# Patient Record
Sex: Male | Born: 1960
Health system: Southern US, Community
[De-identification: ages and names within clinical notes are randomized; demographics above are authoritative.]

## PROBLEM LIST (undated history)

## (undated) DIAGNOSIS — I10 Essential (primary) hypertension: Secondary | ICD-10-CM

## (undated) DIAGNOSIS — R0600 Dyspnea, unspecified: Secondary | ICD-10-CM

## (undated) DIAGNOSIS — Q2381 Bicuspid aortic valve: Secondary | ICD-10-CM

## (undated) DIAGNOSIS — Q231 Congenital insufficiency of aortic valve: Secondary | ICD-10-CM

## (undated) DIAGNOSIS — K219 Gastro-esophageal reflux disease without esophagitis: Secondary | ICD-10-CM

## (undated) DIAGNOSIS — K2289 Other specified disease of esophagus: Secondary | ICD-10-CM

## (undated) DIAGNOSIS — K449 Diaphragmatic hernia without obstruction or gangrene: Secondary | ICD-10-CM

## (undated) DIAGNOSIS — K900 Celiac disease: Secondary | ICD-10-CM

## (undated) DIAGNOSIS — I25119 Atherosclerotic heart disease of native coronary artery with unspecified angina pectoris: Secondary | ICD-10-CM

## (undated) DIAGNOSIS — K228 Other specified diseases of esophagus: Secondary | ICD-10-CM

## (undated) DIAGNOSIS — D509 Iron deficiency anemia, unspecified: Secondary | ICD-10-CM

## (undated) DIAGNOSIS — Z953 Presence of xenogenic heart valve: Secondary | ICD-10-CM

## (undated) DIAGNOSIS — R011 Cardiac murmur, unspecified: Secondary | ICD-10-CM

## (undated) DIAGNOSIS — K2970 Gastritis, unspecified, without bleeding: Secondary | ICD-10-CM

## (undated) DIAGNOSIS — K649 Unspecified hemorrhoids: Secondary | ICD-10-CM

## (undated) DIAGNOSIS — E039 Hypothyroidism, unspecified: Secondary | ICD-10-CM

## (undated) DIAGNOSIS — E785 Hyperlipidemia, unspecified: Secondary | ICD-10-CM

## (undated) DIAGNOSIS — M1990 Unspecified osteoarthritis, unspecified site: Secondary | ICD-10-CM

## (undated) DIAGNOSIS — I350 Nonrheumatic aortic (valve) stenosis: Secondary | ICD-10-CM

## (undated) DIAGNOSIS — I7121 Aneurysm of the ascending aorta, without rupture: Secondary | ICD-10-CM

## (undated) HISTORY — DX: Unspecified hemorrhoids: K64.9

## (undated) HISTORY — DX: Nonrheumatic aortic (valve) stenosis: I35.0

## (undated) HISTORY — DX: Celiac disease: K90.0

## (undated) HISTORY — PX: SHOULDER ARTHROSCOPY W/ ROTATOR CUFF REPAIR: SHX2400

## (undated) HISTORY — PX: HEMORRHOID BANDING: SHX5850

## (undated) HISTORY — DX: Gastro-esophageal reflux disease without esophagitis: K21.9

## (undated) HISTORY — PX: UPPER GASTROINTESTINAL ENDOSCOPY: SHX188

## (undated) HISTORY — PX: CORONARY ANGIOPLASTY: SHX604

## (undated) HISTORY — DX: Congenital insufficiency of aortic valve: Q23.1

## (undated) HISTORY — PX: BACK SURGERY: SHX140

## (undated) HISTORY — DX: Hypothyroidism, unspecified: E03.9

## (undated) HISTORY — DX: Essential (primary) hypertension: I10

## (undated) HISTORY — PX: CARDIAC VALVE REPLACEMENT: SHX585

## (undated) HISTORY — DX: Gastritis, unspecified, without bleeding: K29.70

## (undated) HISTORY — DX: Other specified diseases of esophagus: K22.8

## (undated) HISTORY — PX: LUMBAR DISC SURGERY: SHX700

## (undated) HISTORY — DX: Atherosclerotic heart disease of native coronary artery with unspecified angina pectoris: I25.119

## (undated) HISTORY — DX: Aneurysm of the ascending aorta, without rupture: I71.21

## (undated) HISTORY — DX: Hyperlipidemia, unspecified: E78.5

## (undated) HISTORY — DX: Iron deficiency anemia, unspecified: D50.9

## (undated) HISTORY — DX: Other specified disease of esophagus: K22.89

## (undated) HISTORY — DX: Diaphragmatic hernia without obstruction or gangrene: K44.9

## (undated) HISTORY — PX: BLADDER SURGERY: SHX569

## (undated) HISTORY — DX: Bicuspid aortic valve: Q23.81

---

## 2001-03-13 ENCOUNTER — Emergency Department (HOSPITAL_COMMUNITY): Admission: EM | Admit: 2001-03-13 | Discharge: 2001-03-13 | Payer: Self-pay | Admitting: Emergency Medicine

## 2006-10-06 HISTORY — PX: ESOPHAGOGASTRODUODENOSCOPY (EGD) WITH ESOPHAGEAL DILATION: SHX5812

## 2006-10-25 ENCOUNTER — Encounter: Payer: Self-pay | Admitting: Internal Medicine

## 2007-08-26 ENCOUNTER — Ambulatory Visit: Payer: Self-pay | Admitting: Internal Medicine

## 2007-08-26 ENCOUNTER — Ambulatory Visit (HOSPITAL_COMMUNITY): Admission: RE | Admit: 2007-08-26 | Discharge: 2007-08-26 | Payer: Self-pay | Admitting: Internal Medicine

## 2007-08-27 ENCOUNTER — Ambulatory Visit (HOSPITAL_COMMUNITY): Admission: RE | Admit: 2007-08-27 | Discharge: 2007-08-27 | Payer: Self-pay | Admitting: Internal Medicine

## 2007-09-01 ENCOUNTER — Ambulatory Visit: Payer: Self-pay | Admitting: Internal Medicine

## 2007-09-28 ENCOUNTER — Ambulatory Visit: Payer: Self-pay | Admitting: Internal Medicine

## 2007-10-07 HISTORY — PX: COLONOSCOPY: SHX174

## 2007-10-30 DIAGNOSIS — E785 Hyperlipidemia, unspecified: Secondary | ICD-10-CM | POA: Insufficient documentation

## 2007-10-30 DIAGNOSIS — E039 Hypothyroidism, unspecified: Secondary | ICD-10-CM | POA: Insufficient documentation

## 2007-10-30 DIAGNOSIS — K219 Gastro-esophageal reflux disease without esophagitis: Secondary | ICD-10-CM | POA: Insufficient documentation

## 2007-10-30 DIAGNOSIS — R1319 Other dysphagia: Secondary | ICD-10-CM | POA: Insufficient documentation

## 2008-05-19 ENCOUNTER — Ambulatory Visit: Payer: Self-pay | Admitting: Internal Medicine

## 2008-06-02 ENCOUNTER — Ambulatory Visit: Payer: Self-pay | Admitting: Internal Medicine

## 2009-12-20 DIAGNOSIS — N401 Enlarged prostate with lower urinary tract symptoms: Secondary | ICD-10-CM | POA: Insufficient documentation

## 2009-12-20 DIAGNOSIS — K222 Esophageal obstruction: Secondary | ICD-10-CM | POA: Insufficient documentation

## 2009-12-20 DIAGNOSIS — I10 Essential (primary) hypertension: Secondary | ICD-10-CM | POA: Insufficient documentation

## 2010-03-18 ENCOUNTER — Emergency Department (HOSPITAL_COMMUNITY): Admission: EM | Admit: 2010-03-18 | Discharge: 2010-03-19 | Payer: Self-pay | Admitting: Emergency Medicine

## 2010-10-06 HISTORY — PX: SHOULDER ARTHROSCOPY W/ ROTATOR CUFF REPAIR: SHX2400

## 2011-02-18 NOTE — Assessment & Plan Note (Signed)
James Wilkins, James Wilkins                         MRN:          [id number]  DATE:08/26/2007                            DOB:          [birth year]    REFERRING PHYSICIAN:  Janalyn Rouse, M.D.   CHIEF COMPLAINT:  Dysphagia.   ASSESSMENT:  A 50 year old white man with chronic indigestion problems.  This is mainly described as a pressure problem.  He is having worsening  reflux, and he is having worsening solid food dysphagia.   PLAN:  Schedule EGD and esophageal dilation, possibly today if we can  add him on to my afternoon schedule.  Risks, benefits and indications  were described.  He understands and agrees to proceed.   HISTORY:  This is a [age] year old white man who has had chronic  indigestion described as pressure and belching over the years.  He had a  cold a month ago and started noticing worsening reflux problems after  that, waking up at night with hot fluid coming up at times.  He has had  intermittent solid food dysphagia, and over the past week he has had 2  episodes where he has had severe impact dysphagia to solid food and felt  like he needed to call 911 almost.  Eventually, that passed.  He has  some epigastric pain.  He had a lot of gas, bloating and belching.  He  does not use caffeine or smoke.  Dr. Brigitte Pulse has put him on Zegerid twice a  day, which I not really relieving this problem at this time.  Interestingly, a brother a has had similar problems, and has had  esophageal dilation.   MEDICATIONS:  1. Synthroid 0.88 mcg daily.  2. Zocor 20 mg daily.  3. Zegerid 40 mg twice daily.  4. Mega-Men multivitamin daily.  5. Tylenol p.r.n.   ALLERGIES:  No known drug allergies.   PAST MEDICAL HISTORY:  1. What sounds like gastroesophageal reflux disease.  2. Dyslipidemia.  3. Hypothyroidism.  4. Possible sleep apnea.   FAMILY HISTORY:  As described above.  He does have a  sister with  ulcerative colitis and a sister with colon polyps apparently.   SOCIAL HISTORY:  He is married.  He is a Aeronautical engineer.  Here with  his wife.  No alcohol, tobacco or drugs.   REVIEW OF SYSTEMS:  He has had some cough and fatigue issues.  All other  systems are negative.   PHYSICAL EXAMINATION:  GENERAL:  A well-developed, well-nourished, white  man.  VITAL SIGNS:  Height 5 feet, 6 inches, weight 173 pounds, blood pressure  128/86, pulse 64.  HEENT:  Eyes are anicteric.  Mouth - posterior pharynx free of lesions.  NECK:  Supple without thyromegaly or masses.  CHEST:  Clear.  HEART:  S1 and S2.  No murmurs, rubs, or gallops.  ABDOMEN:  Soft, nontender.  No organomegaly or mass.  EXTREMITIES:  Lower extremities free of edema.  Observed areas of the  skin on the trunk are normal.  NEUROLOGIC:  He is alert and  oriented x3.   I have reviewed the medication list, labs (normal CBC and CMET, November  2008).     Gatha Mayer, MD,FACG  Electronically Signed    CEG/MedQ  DD: 08/26/2007  DT: 08/26/2007  Job #: [id number]   cc:   Janalyn Rouse, M.D.

## 2011-02-18 NOTE — Assessment & Plan Note (Signed)
James Wilkins OFFICE NOTE   James Wilkins, James Wilkins                         MRN:          [id number]  DATE:09/28/2007                            DOB:          02-Nov-1960    CHIEF COMPLAINT:  Follow up after dilation, reflux.   EGD on August 26, 2007 showed mildly tortuous esophagus, dilated to 64  [place] with a Maloney dilation.  He says that helped his dysphagia quite  a bit.  He has done a barium swallow as well that showed normal  motility, a small hiatal hernia, and some siphon reflux.  He remains on  Zegerid 40 mg b.i.d., but he is still having belching but also what  sounds like reflux of fluid at times.  He belches a couple of times in  the exam room while we are here together.   His other medications include Synthroid, Zocor, and Mega Men  multivitamin.   PHYSICAL EXAMINATION:  VITAL SIGNS:  Weight 175 pounds.  Pulse 66, blood  pressure 114/68.   Note that when he gets an episode of reflux, he says that he wants to  cut off his air quite a bit.  He feels quite miserable.  There is,  perhaps, some nocturnal reflux at times as well.   ASSESSMENT:  1. Symptoms of gastroesophageal reflux disease.  2. Symptoms of belching, I am not sure if they are related.  3. Dysphagia, appears to be resolved.  No stricture.   PLAN:  The Zegerid is not working.  We are going to try Aciphex 20 mg  b.i.d.  I have given him samples of that.  He will call in two weeks.  If that is not helpful, he could need a Bravo pH probe.  The question  would be whether to do it off or on medication.  He has had some  atypical symptoms of pressure.  A manometry could be indicated as well,  given the dysphagia history and the pressure symptoms.  He could even  need an impedance study, which would give [place] both acid and non-acid  reflux data, although he would have to go to [facility] for that.  We will  see how he does with the two week trial  of Aciphex.  If that works, it  will be easy.  We will just continue that.     Gatha Mayer, MD,FACG  Electronically Signed    CEG/MedQ  DD: 09/28/2007  DT: 09/29/2007  Job #: [id number]   cc:   Janalyn Rouse, M.D.

## 2011-06-30 ENCOUNTER — Other Ambulatory Visit: Payer: Self-pay | Admitting: Otolaryngology

## 2011-06-30 DIAGNOSIS — H905 Unspecified sensorineural hearing loss: Secondary | ICD-10-CM

## 2011-07-02 ENCOUNTER — Other Ambulatory Visit: Payer: Self-pay

## 2012-05-14 DIAGNOSIS — H8110 Benign paroxysmal vertigo, unspecified ear: Secondary | ICD-10-CM | POA: Insufficient documentation

## 2014-03-31 DIAGNOSIS — K644 Residual hemorrhoidal skin tags: Secondary | ICD-10-CM | POA: Insufficient documentation

## 2014-04-04 DIAGNOSIS — K900 Celiac disease: Secondary | ICD-10-CM | POA: Insufficient documentation

## 2014-04-05 ENCOUNTER — Telehealth: Payer: Self-pay | Admitting: Internal Medicine

## 2014-04-05 NOTE — Telephone Encounter (Signed)
Unable to reach pt or leave a message  

## 2014-04-06 NOTE — Telephone Encounter (Signed)
Left message on home phone for pt to call back.

## 2014-04-10 LAB — IFOBT (OCCULT BLOOD): IFOBT: NEGATIVE

## 2014-04-11 NOTE — Telephone Encounter (Signed)
After explaining office policy for switching physicians, pt has decided to stay with Dr. Carlean Purl.

## 2014-06-20 ENCOUNTER — Encounter: Payer: Self-pay | Admitting: Internal Medicine

## 2014-08-16 ENCOUNTER — Encounter: Payer: Self-pay | Admitting: Gastroenterology

## 2014-08-18 ENCOUNTER — Encounter: Payer: Self-pay | Admitting: Internal Medicine

## 2014-08-18 ENCOUNTER — Ambulatory Visit (INDEPENDENT_AMBULATORY_CARE_PROVIDER_SITE_OTHER): Payer: 59 | Admitting: Internal Medicine

## 2014-08-18 VITALS — BP 144/72 | HR 56 | Ht 65.25 in | Wt 167.1 lb

## 2014-08-18 DIAGNOSIS — K649 Unspecified hemorrhoids: Secondary | ICD-10-CM

## 2014-08-18 DIAGNOSIS — K644 Residual hemorrhoidal skin tags: Secondary | ICD-10-CM | POA: Insufficient documentation

## 2014-08-18 DIAGNOSIS — K900 Celiac disease: Secondary | ICD-10-CM

## 2014-08-18 NOTE — Assessment & Plan Note (Signed)
Reassess and consider Tx options at next visit. May improve with improvement of diarrhea. Colonoscopy 2009 and Hemosure negative this year

## 2014-08-18 NOTE — Assessment & Plan Note (Signed)
We discussed the pros and cons of endoscopy and duodenal biopsy about some experts recommended some experts that is not necessary. He has decided not to pursue an upper endoscopy with biopsy of the duodenum. He will continue on a gluten-free diet and work on improving that and we will send into a dietitian. I will see him back in January to regroup and see how he is doing. I had a brief discussion about the other risks and problems of celiac disease and an informational handout was given. 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 Consider further testing such as vitamin Dand vitamin B12 etc. When he returns.however his basic laboratories looked fine.

## 2014-08-18 NOTE — Patient Instructions (Addendum)
You have been referred to a dietician and will be contacted about an appointment.  We want to see you again in January, appointment made for 10/20/14 at 8:30am.  Today you have been given a handout to read on Celiac sprue.   I appreciate the opportunity to care for you.

## 2014-08-18 NOTE — Progress Notes (Signed)
Referred by Janalyn Rouse, MD Subjective:    Patient ID: James Wilkins, male    DOB: 29-Aug-1961, 53 y.o.   MRN: [id number]  HPI The patient is a pleasant 53 year old white man I know from prior colonoscopy and upper endoscopy procedures. For the past 6 months or so he is been having diarrhea problems with bloating and flatulence. Sometimes he was apprised and passes mucous or small amounts of stool and has incontinence. Recent blood testing has raised the possibility of celiac disease. He has a positive anti-and a mesial antibody and a positive tissue transglutaminase antibody, both are IgA based tests and his serum IgA is normal.He might be a little bit better while waiting for this appointment as he has started to restrict gluten somewhat but overall still has urgent loose defecation and gassy symptoms.  He has had chronic intermittent rectal bleeding he attributed to hemorrhoids, slight red blood on the toilet paper, none mixed in with the stool and no melena. No Known Allergies   Current outpatient prescriptions: levothyroxine (SYNTHROID, LEVOTHROID) 125 MCG tablet, Take 125 mcg by mouth daily before breakfast., Disp: , Rfl: ;  simvastatin (ZOCOR) 20 MG tablet, Take 20 mg by mouth at bedtime., Disp: , Rfl:    Past Medical History  Diagnosis Date  . Hiatal hernia   . GERD (gastroesophageal reflux disease)   . Hemorrhoids   . Dilation of esophagus     tortuous esophagus  . Gastritis     non erosive  . HLD (hyperlipidemia)   . Hypothyroidism   . Hypertension    Past Surgical History  Procedure Laterality Date  . Colonoscopy  2009  . Esophagogastroduodenoscopy  2008  . Back surgery  1999  . Shoulder surgery Right 2012  . Shoulder surgery Left 2014    x2   History   Social History  . Marital Status: Married    Spouse Name: N/A    Number of Children: 0  . Years of Education: N/A   Occupational History  . Furniture Loss adjuster, chartered    Social History Main Topics  . Smoking  status: Never Smoker   . Smokeless tobacco: Never Used  . Alcohol Use: No  . Drug Use: No   Social History Narrative   Married no children works as a Aeronautical engineer in [place] History  Problem Relation Age of Onset  . Diabetes Father     Type II  . Brain cancer Father   . Heart disease Mother   . Heart disease Father   . Colon cancer Neg Hx   . Colon polyps Sister   . Kidney disease Neg Hx   . Esophageal cancer Neg Hx         Review of Systems All other review of systems negative or as per history of present illness    Objective:   Physical Exam General:  Well-developed, well-nourished and in no acute distress Eyes:  anicteric. Neck:   supple w/o thyromegaly or mass.  Lungs: Clear to auscultation bilaterally. Heart:  S1S2, no rubs, murmurs, gallops. Abdomen:  soft, non-tender, no hepatosplenomegaly, hernia, or mass and BS+.  Rectal: Small anterior (right) pile, some mild anal stenosis, no mass, no stool, no blood Lymph:  no cervical or supraclavicular adenopathy. Extremities:   no edema Skin   no rash. Neuro:  A&O x 3.  Psych:  appropriate mood and  Affect.   Data Reviewed: Labs from Dr. Raul Del office dated 04/10/2014. Normal TSH,  normal free T4,negative FOBT immune based, low serum testosterone at 272, normal comprehensive metabolic panel and CBC     Assessment & Plan:  Celiac disease We discussed the pros and cons of endoscopy and duodenal biopsy about some experts recommended some experts that is not necessary. He has decided not to pursue an upper endoscopy with biopsy of the duodenum. He will continue on a gluten-free diet and work on improving that and we will send into a dietitian. I will see him back in January to regroup and see how he is doing. I had a brief discussion about the other risks and problems of celiac disease and an informational handout was given. 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 Consider further testing  such as vitamin Dand vitamin B12 etc. When he returns.however his basic laboratories looked fine.  Bleeding hemorrhoids Reassess and consider Tx options at next visit. May improve with improvement of diarrhea. Colonoscopy 2009 and Hemosure negative this year    I appreciate the opportunity to care for this patient. QQ:PYPP, Gwyndolyn Saxon, MD

## 2014-09-21 ENCOUNTER — Encounter: Payer: Self-pay | Admitting: Skilled Nursing Facility1

## 2014-09-21 ENCOUNTER — Encounter: Payer: 59 | Attending: Internal Medicine | Admitting: Skilled Nursing Facility1

## 2014-09-21 VITALS — Ht 66.0 in | Wt 168.0 lb

## 2014-09-21 DIAGNOSIS — Z713 Dietary counseling and surveillance: Secondary | ICD-10-CM | POA: Diagnosis not present

## 2014-09-21 DIAGNOSIS — K900 Celiac disease: Secondary | ICD-10-CM | POA: Insufficient documentation

## 2014-09-21 NOTE — Patient Instructions (Addendum)
-  Try sherbet instead of ice cream -When buying supplements look for the USP seal -Once a month try a new vegetable -Try a chicken sandwich - Stir more towards plant fast rather than animal such as canola oil or olive oil -[employer]-Google for recipes -Allergan label looks like *This product may contain _____"

## 2014-09-21 NOTE — Progress Notes (Signed)
Medical Nutrition Therapy:  Appt start time: 0800 end time:  0900.   Assessment:  Primary concerns today: Patient referred for celiac disease. The patient would like help with how to be gluten free. Since he was officially diagnosed with celiac's he has only eaten gluten free products. The patient had a lot of diarrhea prior to changing his diet to gluten free. The patient complains of some episodes of GI distress but it is less frequent than before his diet change. The patient also complains of GI distress when he has a bowl of ice cream.  But does not have any issues with cheese and does not consume any other dairy product. The patient's wife came with him and states he will say he does not like a food even though he has never tried it and does not like to consume foods he is unfamiliar with especially vegetables. The patient is on a cholesterol lowering medication as well as a thyroid medication for some hypothyroidism. The patient states he was about 185 pounds but that weight was lost without a change of diet or physical activity so the weight loss is most likely due to the malabsorptive celiac disease. His wife seems very supportive and maybe even frustrated wife her husbands lack of willingness to eat a variety of foods.   Preferred Learning Style:   Auditory  Visual  Learning Readiness:   Contemplating  MEDICATIONS: See List   DIETARY INTAKE:  Usual eating pattern includes 3 meals and 3 snacks per day.  Everyday foods include [place] and cheese sandwhich.  Avoided foods include most fruits and all vegetables as well as mustard.   24-hr recall:  B ( AM): [place] and cheese sandwich on gluten free bread Snk ( AM): payday or butter finger L ( PM): [place] and cheese sandwich and mashed potatoes (packaged-gluten free) Snk ( PM): payday or butter finger------banana D ( PM): chicken tenders, mashed potatoes Snk ( PM): bowel of ice cream Beverages: dr. Malachi Bonds, water  Usual physical  activity: Hard Labor; home gym with free weight-[employer] extreme-1 a week  Estimated energy needs: 2000 calories 225 g carbohydrates 150 g protein 50 g fat  Progress Towards Goal(s):  In progress.   Nutritional Diagnosis:  NB-1.1 Food and nutrition-related knowledge deficit As related to a new diagnosis of a nutritionally treated disease state (celiac disease).  As evidenced by consuming packaged products without reading the nutrition label .    Intervention:  Nutrition counseling for celiac disease. Dietitian educated the patient on celiac disease, how it is nutritionally treated, and the ramifications of the consumption of wheat. The diatitian also taught the patient on the importance of a varied diet including vegetables and the importance of reading the label of processed foods especially the luncheon meats he eats every day.  Goals:  -Try sherbet instead of ice cream -When buying supplements look for the USP seal -Once a month try a new vegetable -Try a chicken sandwich - Stear more towards plant fats rather than animal such as canola oil or olive oil -[employer]-Google for recipes -Allergan label looks like "*This product may contain _____"  Teaching Method Utilized:  Visual Auditory   Handouts given during visit include:  Celiac Handouts from [facility]  Barriers to learning/adherence to lifestyle change: Patient has been eating the same limited diet his whole life and is resistance to adding more fruits and vegetables but he has not had an issue with cutting gluten from his diet.  Demonstrated degree of  understanding via:  Teach Back   Monitoring/Evaluation:  Dietary intake prn.

## 2014-09-27 ENCOUNTER — Ambulatory Visit: Payer: 59 | Admitting: Dietician

## 2014-10-20 ENCOUNTER — Other Ambulatory Visit (INDEPENDENT_AMBULATORY_CARE_PROVIDER_SITE_OTHER): Payer: 59

## 2014-10-20 ENCOUNTER — Ambulatory Visit (INDEPENDENT_AMBULATORY_CARE_PROVIDER_SITE_OTHER): Payer: 59 | Admitting: Internal Medicine

## 2014-10-20 ENCOUNTER — Encounter: Payer: Self-pay | Admitting: Internal Medicine

## 2014-10-20 VITALS — BP 136/76 | HR 68 | Ht 65.25 in | Wt 170.1 lb

## 2014-10-20 DIAGNOSIS — K900 Celiac disease: Secondary | ICD-10-CM

## 2014-10-20 DIAGNOSIS — K649 Unspecified hemorrhoids: Secondary | ICD-10-CM

## 2014-10-20 LAB — VITAMIN D 25 HYDROXY (VIT D DEFICIENCY, FRACTURES): VITD: 30.41 ng/mL (ref 30.00–100.00)

## 2014-10-20 LAB — IGA: IgA: 254 mg/dL (ref 68–378)

## 2014-10-20 LAB — VITAMIN B12: VITAMIN B 12: 1162 pg/mL — AB (ref 211–911)

## 2014-10-20 NOTE — Assessment & Plan Note (Addendum)
Improving as diarrhea proves. We revisited the possibility of hemorrhoid banding as an option but it sounds like these may improve further, he is not inclined to pursue that at this time but he knows that the possibility if desired.

## 2014-10-20 NOTE — Assessment & Plan Note (Addendum)
Better on gluten-free diet. Check TTG IgA a vitamin D level and B12 level. Anticipate he will be able to follow-up with his excellent primary care provider and have biannual or at least annual serologies checked assuming things are going well.

## 2014-10-20 NOTE — Progress Notes (Addendum)
   Subjective:    Patient ID: James Wilkins, male    DOB: 1961-04-12, 54 y.o.   MRN: [id number]  HPI James Wilkins is here for follow-up of celiac disease and bleeding hemorrhoids. Diarrhea is much better on a gluten-free diet. He met with a dietitian who reinforced what diet changes he had been doing. Denies any achiness or fatigue other than some joint pains disease had off and on and certain joints with prior orthopedic treatment. Hemorrhoids are bleeding less, as diarrhea is calming down. Medications, allergies, past medical history, past surgical history, family history and social history are reviewed and updated in the EMR.   Review of Systems As above    Objective:   Physical Exam Well-developed well-nourished no acute distress BP 136/76 mmHg  Pulse 68  Ht 5' 5.25" (1.657 m)  Wt 170 lb 2 oz (77.168 kg)  BMI 28.11 kg/m2     Assessment & Plan:  Bleeding hemorrhoids Improving as diarrhea proves. We revisited the possibility of hemorrhoid banding as an option but it sounds like these may improve further, he is not inclined to pursue that at this time but he knows that the possibility if desired.   Celiac disease Better on gluten-free diet. Check TTG IgA a vitamin D level and B12 level. Anticipate he will be able to follow-up with his excellent primary care provider and have biannual or at least annual serologies checked assuming things are going well.    I appreciate the opportunity to care for this patient. CC: Marton Redwood, MD

## 2014-10-20 NOTE — Patient Instructions (Signed)
Your physician has requested that you go to the basement for the following lab work before leaving today: B-12, Vitamin D level, TTG/IGA   I appreciate the opportunity to care for you. Silvano Rusk, M.D., [facility]

## 2014-10-23 LAB — TISSUE TRANSGLUTAMINASE, IGA: TISSUE TRANSGLUTAMINASE AB, IGA: 5 U/mL — AB (ref <4)

## 2014-10-24 NOTE — Progress Notes (Signed)
Quick Note:  Let him know he is doing a good job with his gluten free diet Ab levels almost normal B12 and vit D ok  Keep up the good work - plan is for him to f/u Dr. Brigitte Pulse and see me prn  I will send labs to Dr. Brigitte Pulse also  ______

## 2014-10-25 ENCOUNTER — Telehealth: Payer: Self-pay | Admitting: Internal Medicine

## 2014-10-25 NOTE — Telephone Encounter (Signed)
Wife notified.  See labs for additional documentation

## 2015-03-30 DIAGNOSIS — E291 Testicular hypofunction: Secondary | ICD-10-CM | POA: Insufficient documentation

## 2015-04-17 ENCOUNTER — Ambulatory Visit: Payer: 59 | Admitting: Internal Medicine

## 2016-03-15 ENCOUNTER — Encounter (HOSPITAL_COMMUNITY): Payer: Self-pay | Admitting: Emergency Medicine

## 2016-03-15 ENCOUNTER — Emergency Department (HOSPITAL_COMMUNITY)
Admission: EM | Admit: 2016-03-15 | Discharge: 2016-03-15 | Disposition: A | Discharge status: Home / Self Care | Payer: 59 | Attending: Emergency Medicine | Admitting: Emergency Medicine

## 2016-03-15 DIAGNOSIS — E785 Hyperlipidemia, unspecified: Secondary | ICD-10-CM | POA: Insufficient documentation

## 2016-03-15 DIAGNOSIS — I10 Essential (primary) hypertension: Secondary | ICD-10-CM | POA: Diagnosis not present

## 2016-03-15 DIAGNOSIS — M5441 Lumbago with sciatica, right side: Secondary | ICD-10-CM | POA: Insufficient documentation

## 2016-03-15 DIAGNOSIS — Y929 Unspecified place or not applicable: Secondary | ICD-10-CM | POA: Diagnosis not present

## 2016-03-15 DIAGNOSIS — Y999 Unspecified external cause status: Secondary | ICD-10-CM | POA: Insufficient documentation

## 2016-03-15 DIAGNOSIS — W010XXA Fall on same level from slipping, tripping and stumbling without subsequent striking against object, initial encounter: Secondary | ICD-10-CM | POA: Diagnosis not present

## 2016-03-15 DIAGNOSIS — Z79899 Other long term (current) drug therapy: Secondary | ICD-10-CM | POA: Diagnosis not present

## 2016-03-15 DIAGNOSIS — Y939 Activity, unspecified: Secondary | ICD-10-CM | POA: Diagnosis not present

## 2016-03-15 DIAGNOSIS — M545 Low back pain: Secondary | ICD-10-CM | POA: Diagnosis present

## 2016-03-15 DIAGNOSIS — Z8719 Personal history of other diseases of the digestive system: Secondary | ICD-10-CM | POA: Insufficient documentation

## 2016-03-15 DIAGNOSIS — E039 Hypothyroidism, unspecified: Secondary | ICD-10-CM | POA: Diagnosis not present

## 2016-03-15 MED ORDER — HYDROMORPHONE HCL 1 MG/ML IJ SOLN
1.0000 mg | Freq: Once | INTRAMUSCULAR | Status: AC
  Administered 2016-03-15: 1 mg via INTRAVENOUS
  Filled 2016-03-15: qty 1

## 2016-03-15 MED ORDER — DIAZEPAM 5 MG PO TABS
5.0000 mg | ORAL_TABLET | Freq: Once | ORAL | Status: AC
  Administered 2016-03-15: 5 mg via ORAL
  Filled 2016-03-15: qty 1

## 2016-03-15 MED ORDER — ONDANSETRON HCL 4 MG/2ML IJ SOLN
4.0000 mg | Freq: Once | INTRAMUSCULAR | Status: AC
  Administered 2016-03-15: 4 mg via INTRAVENOUS
  Filled 2016-03-15: qty 2

## 2016-03-15 MED ORDER — OXYCODONE-ACETAMINOPHEN 5-325 MG PO TABS: 1.0000 | ORAL_TABLET | ORAL | Status: DC | PRN

## 2016-03-15 MED ORDER — KETOROLAC TROMETHAMINE 30 MG/ML IJ SOLN
30.0000 mg | Freq: Once | INTRAMUSCULAR | Status: AC
  Administered 2016-03-15: 30 mg via INTRAVENOUS
  Filled 2016-03-15: qty 1

## 2016-03-15 MED ORDER — DEXAMETHASONE SODIUM PHOSPHATE 10 MG/ML IJ SOLN
10.0000 mg | Freq: Once | INTRAMUSCULAR | Status: AC
  Administered 2016-03-15: 10 mg via INTRAVENOUS
  Filled 2016-03-15: qty 1

## 2016-03-15 MED ORDER — IBUPROFEN 800 MG PO TABS: 800.0000 mg | ORAL_TABLET | Freq: Three times a day (TID) | ORAL | Status: DC

## 2016-03-15 MED ORDER — MORPHINE SULFATE (PF) 4 MG/ML IV SOLN
4.0000 mg | Freq: Once | INTRAVENOUS | Status: AC
  Administered 2016-03-15: 4 mg via INTRAVENOUS
  Filled 2016-03-15: qty 1

## 2016-03-15 MED ORDER — DIAZEPAM 5 MG PO TABS: 5.0000 mg | ORAL_TABLET | Freq: Two times a day (BID) | ORAL | Status: DC | PRN

## 2016-03-15 NOTE — Discharge Instructions (Signed)
Please call your neurosurgeon or Dr. Christella Noa for neurosurgery evaluation. In the meantime I will give you a few prescriptions to help with your pain. Return to the emergency room for new or worsening symptoms.

## 2016-03-15 NOTE — ED Provider Notes (Signed)
CSN: [id number]     Arrival date & time 03/15/16  0607 History   First MD Initiated Contact with Patient 03/15/16 [phone]     Chief Complaint  Patient presents with  . Back Pain     HPI   James Wilkins is an 55 y.o. male with history of chronic back pain, HTN, HLD, GERD who presents to the ED for evaluation of back pain. He states that yesterday his lawnmower fell into a ditch and he tried to pull it out and he thinks he pulled something "deep in his back." He states at first the pain was not that bad but over the course of the evening and overnight the pain has progressed and is now unbearable and he cannot find a comfortable position. He states the pain is all over his low back and radiates down his right hip and thigh. He states it feels like it is "deep inside" and "does not feel like a muscle." He states he has tried aleve and tylenol with no relief. Denies new numbness, weakness, or tingling. He states walking is difficult due to his degree of pain. He does have a history of prior back surgeries, he has not seen neurosurgery in many years.   Past Medical History  Diagnosis Date  . Hiatal hernia   . GERD (gastroesophageal reflux disease)   . Hemorrhoids   . Dilation of esophagus     tortuous esophagus  . Gastritis     non erosive  . HLD (hyperlipidemia)   . Hypothyroidism   . Hypertension   . Celiac disease    Past Surgical History  Procedure Laterality Date  . Colonoscopy  2009  . Esophagogastroduodenoscopy  2008  . Back surgery  1999  . Shoulder surgery Right 2012  . Shoulder surgery Left 2014    x2  . Bladder surgery     Family History  Problem Relation Age of Onset  . Diabetes Father     Type II  . Brain cancer Father   . Heart disease Father   . Heart disease Mother   . Colon cancer Neg Hx   . Kidney disease Neg Hx   . Esophageal cancer Neg Hx   . Colon polyps Sister   . Cancer Other   . Hypertension Other    Social History  Substance Use Topics  . Smoking  status: Never Smoker   . Smokeless tobacco: Never Used  . Alcohol Use: No    Review of Systems  All other systems reviewed and are negative.     Allergies  Review of patient's allergies indicates no known allergies.  Home Medications   Prior to Admission medications   Medication Sig Start Date End Date Taking? Authorizing Provider  acetaminophen (TYLENOL) 500 MG tablet Take 1,000 mg by mouth every 6 (six) hours as needed for mild pain.   Yes Historical Provider, MD  levothyroxine (SYNTHROID, LEVOTHROID) 100 MCG tablet Take 100 mcg by mouth daily before breakfast.   Yes Historical Provider, MD  lisinopril (PRINIVIL,ZESTRIL) 20 MG tablet Take 20 mg by mouth daily.   Yes Historical Provider, MD  Multiple Vitamins-Minerals (MULTIVITAMIN WITH MINERALS) tablet Take 1 tablet by mouth daily.   Yes Historical Provider, MD  naproxen sodium (ANAPROX) 220 MG tablet Take 440 mg by mouth 2 (two) times daily as needed (pain).   Yes Historical Provider, MD  simvastatin (ZOCOR) 20 MG tablet Take 20 mg by mouth at bedtime.   Yes Historical Provider, MD  BP 182/103 mmHg  Pulse 74  Temp(Src) 98 F (36.7 C) (Oral)  Resp 20  SpO2 100% Physical Exam  Constitutional: He is oriented to person, place, and time.  Appears uncomfortable, NAD  HENT:  Right Ear: External ear normal.  Left Ear: External ear normal.  Nose: Nose normal.  Mouth/Throat: Oropharynx is clear and moist. No oropharyngeal exudate.  Eyes: Conjunctivae and EOM are normal. Pupils are equal, round, and reactive to light.  Neck: Normal range of motion. Neck supple.  Cardiovascular: Normal rate, regular rhythm, normal heart sounds and intact distal pulses.   Pulmonary/Chest: Effort normal and breath sounds normal. No respiratory distress. He has no wheezes. He exhibits no tenderness.  Abdominal: Soft. Bowel sounds are normal. He exhibits no distension. There is no tenderness. There is no rebound and no guarding.  Musculoskeletal: He  exhibits no edema.  No midline back tenderness. No stepoff or deformity. Diffuse bilateral lumbar paraspinal ttp. Positive R SLR.  Neurological: He is alert and oriented to person, place, and time. No cranial nerve deficit.  5/5 strength bilateral LE and UE. Intact sensation. 2+ dp. Normal finger to nose. No pronator drift.  Skin: Skin is warm and dry.  Psychiatric: He has a normal mood and affect.  Nursing note and vitals reviewed.   ED Course  Procedures (including critical care time) Labs Review Labs Reviewed - No data to display  Imaging Review No results found. I have personally reviewed and evaluated these images and lab results as part of my medical decision-making.   EKG Interpretation None      MDM   Final diagnoses:  Bilateral low back pain with right-sided sciatica    No midline back tenderness. No focal neuro findings. Will hold off on back imaging today. I do suspect possible disc herniation. Instructed close f/u with neurosurgery. Rx for supportive meds given. Pain improved in the ED. ER return precautions given.    Anne Ng, PA-C 03/15/16 [place], MD 03/15/16 [phone]

## 2016-03-15 NOTE — ED Notes (Addendum)
Pt presents to ER from home for severe lower back pain after falling off lawn mower yesterday afternoon; pt reports taking aleve prior to bed and was able to sleep but woke up this morning to toilet and pain worsened to where patient was diaphoretic; pt reports "cannot find a comfortable position"; pt reports pain runs down right hip to right knee

## 2016-03-18 ENCOUNTER — Other Ambulatory Visit: Payer: Self-pay | Admitting: Internal Medicine

## 2016-03-18 ENCOUNTER — Other Ambulatory Visit: Payer: 59

## 2016-03-18 DIAGNOSIS — M5416 Radiculopathy, lumbar region: Secondary | ICD-10-CM

## 2016-03-19 ENCOUNTER — Other Ambulatory Visit: Payer: 59

## 2016-05-28 ENCOUNTER — Other Ambulatory Visit: Payer: Self-pay | Admitting: Internal Medicine

## 2016-05-28 ENCOUNTER — Other Ambulatory Visit: Payer: Self-pay | Admitting: *Deleted

## 2016-05-28 DIAGNOSIS — M542 Cervicalgia: Secondary | ICD-10-CM

## 2016-06-02 ENCOUNTER — Ambulatory Visit
Admission: RE | Admit: 2016-06-02 | Discharge: 2016-06-02 | Disposition: A | Discharge status: Home / Self Care | Payer: 59 | Source: Ambulatory Visit | Attending: Internal Medicine | Admitting: Internal Medicine

## 2016-06-02 DIAGNOSIS — M542 Cervicalgia: Secondary | ICD-10-CM

## 2016-12-30 ENCOUNTER — Ambulatory Visit: Payer: 59 | Admitting: Physician Assistant

## 2017-01-21 DIAGNOSIS — F320 Major depressive disorder, single episode, mild: Secondary | ICD-10-CM | POA: Insufficient documentation

## 2017-06-09 ENCOUNTER — Other Ambulatory Visit: Payer: Self-pay | Admitting: Internal Medicine

## 2017-06-09 DIAGNOSIS — R011 Cardiac murmur, unspecified: Secondary | ICD-10-CM

## 2017-06-11 ENCOUNTER — Other Ambulatory Visit (HOSPITAL_COMMUNITY): Payer: 59

## 2017-07-10 ENCOUNTER — Other Ambulatory Visit: Payer: Self-pay

## 2017-07-10 ENCOUNTER — Ambulatory Visit (HOSPITAL_COMMUNITY): Payer: 59 | Attending: Cardiovascular Disease

## 2017-07-10 DIAGNOSIS — R011 Cardiac murmur, unspecified: Secondary | ICD-10-CM

## 2017-07-10 DIAGNOSIS — Q231 Congenital insufficiency of aortic valve: Secondary | ICD-10-CM | POA: Insufficient documentation

## 2017-07-10 DIAGNOSIS — E785 Hyperlipidemia, unspecified: Secondary | ICD-10-CM | POA: Insufficient documentation

## 2017-07-10 DIAGNOSIS — I071 Rheumatic tricuspid insufficiency: Secondary | ICD-10-CM | POA: Insufficient documentation

## 2017-07-21 DIAGNOSIS — I359 Nonrheumatic aortic valve disorder, unspecified: Secondary | ICD-10-CM | POA: Insufficient documentation

## 2017-07-21 DIAGNOSIS — Z952 Presence of prosthetic heart valve: Secondary | ICD-10-CM | POA: Insufficient documentation

## 2017-07-30 ENCOUNTER — Encounter: Payer: Self-pay | Admitting: Cardiovascular Disease

## 2017-07-30 ENCOUNTER — Ambulatory Visit (INDEPENDENT_AMBULATORY_CARE_PROVIDER_SITE_OTHER): Payer: 59 | Admitting: Cardiovascular Disease

## 2017-07-30 VITALS — BP 140/80 | HR 51 | Ht 65.0 in | Wt 164.4 lb

## 2017-07-30 DIAGNOSIS — I350 Nonrheumatic aortic (valve) stenosis: Secondary | ICD-10-CM | POA: Diagnosis not present

## 2017-07-30 NOTE — Patient Instructions (Addendum)
Medication Instructions:  Your provider recommends that you continue on your current medications as directed. Please refer to the Current Medication list given to you today.    Labwork: TODAY: BMET, CBC, PT/INR  Testing/Procedures: Dr. Burt Knack recommends you have a CHEST CT.  Your physician has requested that you have a cardiac catheterization. Cardiac catheterization is used to diagnose and/or treat various heart conditions. Doctors may recommend this procedure for a number of different reasons. The most common reason is to evaluate chest pain. Chest pain can be a symptom of coronary artery disease (CAD), and cardiac catheterization can show whether plaque is narrowing or blocking your heart's arteries. This procedure is also used to evaluate the valves, as well as measure the blood flow and oxygen levels in different parts of your heart. For further information please visit [email or web address]. Please follow instruction sheet, as given.  Follow-Up: You have been referred to Dr. Roxy Manns for AVR.  Any Other Special Instructions Will Be Listed Below (If Applicable).     If you need a refill on your cardiac medications before your next appointment, please call your pharmacy.

## 2017-07-30 NOTE — H&P (View-Only) (Signed)
Cardiology Office Note Date:  07/30/2017   ID:  James Wilkins, James Wilkins 1961-01-31, MRN [id number]  PCP:  Marton Redwood, MD  Cardiologist:  [name], MD    Chief Complaint  Patient presents with  . New Patient (Initial Visit)    AS     History of Present Illness: James Wilkins is a 56 y.o. male who presents for evaluation of severe aortic stenosis, referred by Dr Brigitte Pulse.   The patient is here with his wife today.  He reports a long-standing heart murmur.  He is asymptomatic at rest and with his normal daily activities.  He does a fair amount of heavy lifting at work and has no symptoms with that level of activity.  However, he recently noticed that when he was raking leaves he experienced chest discomfort and shortness of breath.  He has had the same symptoms when he has worked out on a [employer]bal[employer].  Symptoms resolve after a few minutes of rest. States the symptoms with raking leaves have been present over the last few years.  He denies exertional lightheadedness or syncope.  He has had no heart palpitations, leg swelling, orthopnea, or PND.  The patient recently had an echocardiogram demonstrating severe bicuspid valve aortic stenosis.  He is referred for further evaluation and treatment.  His mother had coronary bypass surgery in her mid 64s.  His sister just underwent transcatheter aortic valve replacement.  He states that she has had a lot of other medical problems.   Past Medical History:  Diagnosis Date  . Celiac disease   . Dilation of esophagus    tortuous esophagus  . Gastritis    non erosive  . GERD (gastroesophageal reflux disease)   . Hemorrhoids   . Hiatal hernia   . HLD (hyperlipidemia)   . Hypertension   . Hypothyroidism     Past Surgical History:  Procedure Laterality Date  . BACK SURGERY  1999  . BLADDER SURGERY    . COLONOSCOPY  2009  . ESOPHAGOGASTRODUODENOSCOPY  2008  . SHOULDER SURGERY Right 2012  . SHOULDER SURGERY Left 2014   x2    Current  Outpatient Prescriptions  Medication Sig Dispense Refill  . levothyroxine (SYNTHROID, LEVOTHROID) 100 MCG tablet Take 100 mcg by mouth daily before breakfast.    . lisinopril (PRINIVIL,ZESTRIL) 20 MG tablet Take 20 mg by mouth daily.    . Multiple Vitamins-Minerals (MULTIVITAMIN WITH MINERALS) tablet Take 1 tablet by mouth daily.    . simvastatin (ZOCOR) 20 MG tablet Take 20 mg by mouth at bedtime.     No current facility-administered medications for this visit.     Allergies:   Patient has no known allergies.   Social History:  The patient  reports that he has never smoked. He has never used smokeless tobacco. He reports that he does not drink alcohol or use drugs.   Family History:  The patient's  family history includes Brain cancer in his father; Cancer in his other; Colon polyps in his sister; Diabetes in his father; Heart disease in his father and mother; Hypertension in his other.   ROS:  Please see the history of present illness.   All other systems are reviewed and negative.   PHYSICAL EXAM: VS:  BP 140/80   Pulse (!) 51   Ht 5' 5"  (1.651 m)   Wt 164 lb 6.4 oz (74.6 kg)   BMI 27.36 kg/m  , BMI Body mass index is 27.36 kg/m. GEN: Well nourished,  well developed, in no acute distress  HEENT: normal  Neck: no JVD, no masses. Delayed carotid upstrokes Cardiac: RRR with 3/6 harsh late peaking systolic murmur, diminished A2               Respiratory:  clear to auscultation bilaterally, normal work of breathing GI: soft, nontender, nondistended, + BS MS: no deformity or atrophy  Ext: no pretibial edema, pedal pulses 2+= bilaterally Skin: warm and dry, no rash Neuro:  Strength and sensation are intact Psych: euthymic mood, full affect  EKG:  EKG is ordered today. The ekg ordered today shows sinus bradycardia 51 bpm, otherwise within normal limits.  Recent Labs: No results found for requested labs within last 8760 hours.   Lipid Panel  No results found for: CHOL, TRIG,  HDL, CHOLHDL, VLDL, LDLCALC, LDLDIRECT    Wt Readings from Last 3 Encounters:  07/30/17 164 lb 6.4 oz (74.6 kg)  06/02/16 165 lb (74.8 kg)  10/20/14 170 lb 2 oz (77.2 kg)     Cardiac Studies Reviewed: Echo 07-10-2017: Study Conclusions  - Left ventricle: The cavity size was normal. Systolic function was   normal. The estimated ejection fraction was in the range of 55%   to 60%. Wall motion was normal; there were no regional wall   motion abnormalities. Left ventricular diastolic function   parameters were normal. Doppler parameters are consistent with   indeterminate ventricular filling pressure. - Aortic valve: Bicuspid; severely thickened, severely calcified   leaflets. There appears to be fusion of the non and left coronary   cusps. Valve mobility was restricted. There was severe stenosis.   There was no regurgitation. Peak velocity (S): 434 cm/s. Mean   gradient (S): 45 mm Hg. Peak gradient (S): 75 mm Hg. - Aorta: Ascending aortic diameter: 38 mm (S). - Ascending aorta: The ascending aorta was mildly dilated. - Mitral valve: Transvalvular velocity was within the normal range.   There was no evidence for stenosis. There was trivial   regurgitation. - Right ventricle: The cavity size was mildly dilated. Wall   thickness was normal. - Tricuspid valve: There was mild regurgitation. - Pulmonary arteries: Systolic pressure was within the normal   range. PA peak pressure: 30 mm Hg (S).  STS RISK CALCULATOR: Risk of Mortality: 0.848%  Morbidity or Mortality: 9.601%  Long Length of Stay: 2.63%  Short Length of Stay: 61.796%  Permanent Stroke: 0.603%  Prolonged Ventilation: 4.457%  DSW Infection: 0.11%  Renal Failure: 1.64%  Reoperation: 5.355%   ASSESSMENT AND PLAN: Severe, symptomatic aortic stenosis (stage D): The patient's exam is clearly consistent with severe aortic stenosis.  His echocardiogram images are reviewed and demonstrates a bicuspid aortic valve with severely  thickened and calcified leaflets.  Doppler data is diagnostic of severe aortic stenosis.  His mean transvalvular gradient is as high as 50 mmHg and peak velocity approximately 4.5 m/s.  I have reviewed the natural history of aortic stenosis with the patient and their family members who are present today. We have discussed the limitations of medical therapy and the poor prognosis associated with symptomatic aortic stenosis. We have reviewed potential treatment options, including palliative medical therapy, conventional surgical aortic valve replacement, and transcatheter aortic valve replacement. We discussed treatment options in the context of the patient's specific comorbid medical conditions.   Aortic valve replacement is indicated to improve quality of life/functional capacity, prevent further progression of exertion symptoms, prevent heart failure, and improve mortality. The patient currently has NYHA II symptoms of  exertional dyspnea.   Plan to obtain a chest CTA to evaluate his aorta in the context of a bicuspid aortic valve.  Will then proceed with right/left heart catheterization to assess his hemodynamics and coronary anatomy. I have reviewed the risks, indications, and alternatives to cardiac catheterization, possible angioplasty, and stenting with the patient. Risks include but are not limited to bleeding, infection, vascular injury, stroke, myocardial infection, arrhythmia, kidney injury, radiation-related injury in the case of prolonged fluoroscopy use, emergency cardiac surgery, and death. The patient understands the risks of serious complication is 1-2 in 2446 with diagnostic cardiac cath and 1-2% or less with angioplasty/stenting.    After obtaining that data, the patient will be referred for formal cardiac surgical evaluation.  He will likely be best treated with conventional aortic valve replacement considering his low STS risk score, lack of significant medical comorbid conditions, young  age, and bicuspid aortic valve morphology.  Current medicines are reviewed with the patient today.  The patient does not have concerns regarding medicines.  Labs/ tests ordered today include:   Orders Placed This Encounter  Procedures  . CT Chest W Contrast  . Basic metabolic panel  . CBC with Differential/Platelet  . PT AND PTT  . EKG 12-Lead    Signed, [name], MD  07/30/2017 10:37 PM    [facility] [place], [place], [place]  [address] Phone: [phone]; Fax: [phone]

## 2017-07-30 NOTE — Progress Notes (Signed)
Cardiology Office Note Date:  07/30/2017   ID:  James Wilkins, James Wilkins [birth year], MRN [id number]  PCP:  Marton Redwood, MD  Cardiologist:  [name], MD    Chief Complaint  Patient presents with  . New Patient (Initial Visit)    AS     History of Present Illness: James Wilkins is a 56 y.o. male who presents for evaluation of severe aortic stenosis, referred by Dr Brigitte Pulse.   The patient is here with his wife today.  He reports a long-standing heart murmur.  He is asymptomatic at rest and with his normal daily activities.  He does a fair amount of heavy lifting at work and has no symptoms with that level of activity.  However, he recently noticed that when he was raking leaves he experienced chest discomfort and shortness of breath.  He has had the same symptoms when he has worked out on a [employer]bal[employer].  Symptoms resolve after a few minutes of rest. States the symptoms with raking leaves have been present over the last few years.  He denies exertional lightheadedness or syncope.  He has had no heart palpitations, leg swelling, orthopnea, or PND.  The patient recently had an echocardiogram demonstrating severe bicuspid valve aortic stenosis.  He is referred for further evaluation and treatment.  His mother had coronary bypass surgery in her mid 39s.  His sister just underwent transcatheter aortic valve replacement.  He states that she has had a lot of other medical problems.   Past Medical History:  Diagnosis Date  . Celiac disease   . Dilation of esophagus    tortuous esophagus  . Gastritis    non erosive  . GERD (gastroesophageal reflux disease)   . Hemorrhoids   . Hiatal hernia   . HLD (hyperlipidemia)   . Hypertension   . Hypothyroidism     Past Surgical History:  Procedure Laterality Date  . BACK SURGERY  1999  . BLADDER SURGERY    . COLONOSCOPY  2009  . ESOPHAGOGASTRODUODENOSCOPY  2008  . SHOULDER SURGERY Right 2012  . SHOULDER SURGERY Left 2014   x2    Current  Outpatient Prescriptions  Medication Sig Dispense Refill  . levothyroxine (SYNTHROID, LEVOTHROID) 100 MCG tablet Take 100 mcg by mouth daily before breakfast.    . lisinopril (PRINIVIL,ZESTRIL) 20 MG tablet Take 20 mg by mouth daily.    . Multiple Vitamins-Minerals (MULTIVITAMIN WITH MINERALS) tablet Take 1 tablet by mouth daily.    . simvastatin (ZOCOR) 20 MG tablet Take 20 mg by mouth at bedtime.     No current facility-administered medications for this visit.     Allergies:   Patient has no known allergies.   Social History:  The patient  reports that he has never smoked. He has never used smokeless tobacco. He reports that he does not drink alcohol or use drugs.   Family History:  The patient's  family history includes Brain cancer in his father; Cancer in his other; Colon polyps in his sister; Diabetes in his father; Heart disease in his father and mother; Hypertension in his other.   ROS:  Please see the history of present illness.   All other systems are reviewed and negative.   PHYSICAL EXAM: VS:  BP 140/80   Pulse (!) 51   Ht 5' 5"  (1.651 m)   Wt 164 lb 6.4 oz (74.6 kg)   BMI 27.36 kg/m  , BMI Body mass index is 27.36 kg/m. GEN: Well nourished,  well developed, in no acute distress  HEENT: normal  Neck: no JVD, no masses. Delayed carotid upstrokes Cardiac: RRR with 3/6 harsh late peaking systolic murmur, diminished A2               Respiratory:  clear to auscultation bilaterally, normal work of breathing GI: soft, nontender, nondistended, + BS MS: no deformity or atrophy  Ext: no pretibial edema, pedal pulses 2+= bilaterally Skin: warm and dry, no rash Neuro:  Strength and sensation are intact Psych: euthymic mood, full affect  EKG:  EKG is ordered today. The ekg ordered today shows sinus bradycardia 51 bpm, otherwise within normal limits.  Recent Labs: No results found for requested labs within last 8760 hours.   Lipid Panel  No results found for: CHOL, TRIG,  HDL, CHOLHDL, VLDL, LDLCALC, LDLDIRECT    Wt Readings from Last 3 Encounters:  07/30/17 164 lb 6.4 oz (74.6 kg)  06/02/16 165 lb (74.8 kg)  10/20/14 170 lb 2 oz (77.2 kg)     Cardiac Studies Reviewed: Echo 07-10-2017: Study Conclusions  - Left ventricle: The cavity size was normal. Systolic function was   normal. The estimated ejection fraction was in the range of 55%   to 60%. Wall motion was normal; there were no regional wall   motion abnormalities. Left ventricular diastolic function   parameters were normal. Doppler parameters are consistent with   indeterminate ventricular filling pressure. - Aortic valve: Bicuspid; severely thickened, severely calcified   leaflets. There appears to be fusion of the non and left coronary   cusps. Valve mobility was restricted. There was severe stenosis.   There was no regurgitation. Peak velocity (S): 434 cm/s. Mean   gradient (S): 45 mm Hg. Peak gradient (S): 75 mm Hg. - Aorta: Ascending aortic diameter: 38 mm (S). - Ascending aorta: The ascending aorta was mildly dilated. - Mitral valve: Transvalvular velocity was within the normal range.   There was no evidence for stenosis. There was trivial   regurgitation. - Right ventricle: The cavity size was mildly dilated. Wall   thickness was normal. - Tricuspid valve: There was mild regurgitation. - Pulmonary arteries: Systolic pressure was within the normal   range. PA peak pressure: 30 mm Hg (S).  STS RISK CALCULATOR: Risk of Mortality: 0.848%  Morbidity or Mortality: 9.601%  Long Length of Stay: 2.63%  Short Length of Stay: 61.796%  Permanent Stroke: 0.603%  Prolonged Ventilation: 4.457%  DSW Infection: 0.11%  Renal Failure: 1.64%  Reoperation: 5.355%   ASSESSMENT AND PLAN: Severe, symptomatic aortic stenosis (stage D): The patient's exam is clearly consistent with severe aortic stenosis.  His echocardiogram images are reviewed and demonstrates a bicuspid aortic valve with severely  thickened and calcified leaflets.  Doppler data is diagnostic of severe aortic stenosis.  His mean transvalvular gradient is as high as 50 mmHg and peak velocity approximately 4.5 m/s.  I have reviewed the natural history of aortic stenosis with the patient and their family members who are present today. We have discussed the limitations of medical therapy and the poor prognosis associated with symptomatic aortic stenosis. We have reviewed potential treatment options, including palliative medical therapy, conventional surgical aortic valve replacement, and transcatheter aortic valve replacement. We discussed treatment options in the context of the patient's specific comorbid medical conditions.   Aortic valve replacement is indicated to improve quality of life/functional capacity, prevent further progression of exertion symptoms, prevent heart failure, and improve mortality. The patient currently has NYHA II symptoms of  exertional dyspnea.   Plan to obtain a chest CTA to evaluate his aorta in the context of a bicuspid aortic valve.  Will then proceed with right/left heart catheterization to assess his hemodynamics and coronary anatomy. I have reviewed the risks, indications, and alternatives to cardiac catheterization, possible angioplasty, and stenting with the patient. Risks include but are not limited to bleeding, infection, vascular injury, stroke, myocardial infection, arrhythmia, kidney injury, radiation-related injury in the case of prolonged fluoroscopy use, emergency cardiac surgery, and death. The patient understands the risks of serious complication is 1-2 in 0223 with diagnostic cardiac cath and 1-2% or less with angioplasty/stenting.    After obtaining that data, the patient will be referred for formal cardiac surgical evaluation.  He will likely be best treated with conventional aortic valve replacement considering his low STS risk score, lack of significant medical comorbid conditions, young  age, and bicuspid aortic valve morphology.  Current medicines are reviewed with the patient today.  The patient does not have concerns regarding medicines.  Labs/ tests ordered today include:   Orders Placed This Encounter  Procedures  . CT Chest W Contrast  . Basic metabolic panel  . CBC with Differential/Platelet  . PT AND PTT  . EKG 12-Lead    Signed, [name], MD  07/30/2017 10:37 PM    [facility] [place], [place], [place]  [address] Phone: [phone]; Fax: [phone]

## 2017-07-31 ENCOUNTER — Other Ambulatory Visit: Payer: Self-pay

## 2017-07-31 ENCOUNTER — Telehealth: Payer: Self-pay

## 2017-07-31 DIAGNOSIS — Q2381 Bicuspid aortic valve: Secondary | ICD-10-CM

## 2017-07-31 DIAGNOSIS — I350 Nonrheumatic aortic (valve) stenosis: Secondary | ICD-10-CM

## 2017-07-31 DIAGNOSIS — Q231 Congenital insufficiency of aortic valve: Secondary | ICD-10-CM

## 2017-07-31 LAB — CBC WITH DIFFERENTIAL/PLATELET
BASOS ABS: 0 10*3/uL (ref 0.0–0.2)
BASOS: 0 %
EOS (ABSOLUTE): 0.1 10*3/uL (ref 0.0–0.4)
Eos: 2 %
HEMOGLOBIN: 14.2 g/dL (ref 13.0–17.7)
Hematocrit: 41 % (ref 37.5–51.0)
IMMATURE GRANS (ABS): 0 10*3/uL (ref 0.0–0.1)
IMMATURE GRANULOCYTES: 0 %
LYMPHS: 39 %
Lymphocytes Absolute: 2.8 10*3/uL (ref 0.7–3.1)
MCH: 31 pg (ref 26.6–33.0)
MCHC: 34.6 g/dL (ref 31.5–35.7)
MCV: 90 fL (ref 79–97)
MONOCYTES: 8 %
Monocytes Absolute: 0.6 10*3/uL (ref 0.1–0.9)
NEUTROS PCT: 51 %
Neutrophils Absolute: 3.8 10*3/uL (ref 1.4–7.0)
PLATELETS: 239 10*3/uL (ref 150–379)
RBC: 4.58 x10E6/uL (ref 4.14–5.80)
RDW: 13.4 % (ref 12.3–15.4)
WBC: 7.3 10*3/uL (ref 3.4–10.8)

## 2017-07-31 LAB — BASIC METABOLIC PANEL
BUN/Creatinine Ratio: 15 (ref 9–20)
BUN: 17 mg/dL (ref 6–24)
CALCIUM: 9.8 mg/dL (ref 8.7–10.2)
CO2: 28 mmol/L (ref 20–29)
CREATININE: 1.15 mg/dL (ref 0.76–1.27)
Chloride: 102 mmol/L (ref 96–106)
GFR calc Af Amer: 81 mL/min/{1.73_m2} (ref >59)
GFR, EST NON AFRICAN AMERICAN: 70 mL/min/{1.73_m2} (ref >59)
Glucose: 85 mg/dL (ref 65–99)
POTASSIUM: 4.4 mmol/L (ref 3.5–5.2)
Sodium: 144 mmol/L (ref 134–144)

## 2017-07-31 LAB — PT AND PTT
INR: 1 (ref 0.8–1.2)
PROTHROMBIN TIME: 10.8 s (ref 9.1–12.0)
aPTT: 32 s (ref 24–33)

## 2017-07-31 NOTE — Telephone Encounter (Signed)
Per patient request, called his wife to arrange L/R heart cath. Catheterization is scheduled 11/7 with Dr. Burt Knack. She understands to have patient at the [facility] at [place]. She understands he should have no food or drink after midnight the night before the procedure except for sips of water was ASA 81 mg. Letter mailed to patient after address confirmed. She was grateful for call and agrees with treatment plan.

## 2017-08-05 ENCOUNTER — Ambulatory Visit (INDEPENDENT_AMBULATORY_CARE_PROVIDER_SITE_OTHER)
Admission: RE | Admit: 2017-08-05 | Discharge: 2017-08-05 | Disposition: A | Discharge status: Home / Self Care | Payer: 59 | Source: Ambulatory Visit | Attending: Cardiovascular Disease | Admitting: Cardiovascular Disease

## 2017-08-05 DIAGNOSIS — I350 Nonrheumatic aortic (valve) stenosis: Secondary | ICD-10-CM

## 2017-08-05 MED ORDER — IOPAMIDOL (ISOVUE-370) INJECTION 76%
100.0000 mL | Freq: Once | INTRAVENOUS | Status: AC | PRN
  Administered 2017-08-05: 100 mL via INTRAVENOUS

## 2017-08-07 ENCOUNTER — Other Ambulatory Visit: Payer: 59

## 2017-08-10 ENCOUNTER — Institutional Professional Consult (permissible substitution): Payer: 59 | Admitting: Thoracic Surgery (Cardiothoracic Vascular Surgery)

## 2017-08-10 ENCOUNTER — Encounter: Payer: Self-pay | Admitting: Thoracic Surgery (Cardiothoracic Vascular Surgery)

## 2017-08-10 ENCOUNTER — Other Ambulatory Visit: Payer: Self-pay

## 2017-08-10 ENCOUNTER — Telehealth: Payer: Self-pay

## 2017-08-10 DIAGNOSIS — Q231 Congenital insufficiency of aortic valve: Secondary | ICD-10-CM

## 2017-08-10 DIAGNOSIS — I350 Nonrheumatic aortic (valve) stenosis: Secondary | ICD-10-CM | POA: Diagnosis not present

## 2017-08-10 DIAGNOSIS — Q2381 Bicuspid aortic valve: Secondary | ICD-10-CM | POA: Insufficient documentation

## 2017-08-10 NOTE — Patient Instructions (Signed)
Continue all previous medications without any changes at this time  

## 2017-08-10 NOTE — Progress Notes (Signed)
HE[facility] [place] [facility] CONSULTATION REPORT  Referring Provider is [name], MD PCP is Marton Redwood, MD  Chief Complaint  Patient presents with  . New Patient (Initial Visit)    aortic valve stenosis, eval for AVR, Cath 08/12/2017, echo    HPI:  Patient is a 56 year old male with history of heart murmur, hypertension, celiac disease, and hyperlipidemia who has been referred for surgical consultation to discuss treatment options for management of recently discovered severe symptomatic aortic stenosis.  The patient states that he has known of the presence of a heart murmur since childhood.  At some point he was evaluated by a cardiologist in the remote past and advised that he should probably avoid strenuous physical exertion but because of concerns of possible risk of sudden death.  He recently was noted to have a prominent systolic murmur on physical exam by his primary care physician, and a transthoracic echocardiogram was performed demonstrating likely bicuspid aortic valve with severe aortic stenosis and preserved left ventricular systolic function.  The patient admits to a several year history of exertional shortness of breath and chest tightness and was referred to Dr. Burt Knack for consultation.  He has been scheduled for diagnostic cardiac catheterization and a CT angiogram performed to evaluate the size of the ascending thoracic aorta.  The patient was referred for surgical consultation.  The patient is married and lives locally in [place] with his wife.  They have no children.  He works full-time as a Aeronautical engineer.  This requires some degree of physical activity and lifting.  The patient has also remained quite active physically all of his adult life.  He enjoys exercising, including lifting weights.  He states that approximately 3 years ago he began to notice a tendency to get short of breath and chest tightness  with more strenuous physical exertion.  He states that if he rakes leaves or petals on a stationary bicycle he will get winded and some tightness across his chest.  He does not get any associated symptoms with low level activity and he can still exercise to some degree.  He has never had any resting shortness of breath, PND, orthopnea, or lower extremity edema.  He does report occasional dizzy spells without any history of syncope.   Past Medical History:  Diagnosis Date  . Aortic stenosis   . Bicuspid aortic valve   . Celiac disease   . Dilation of esophagus    tortuous esophagus  . Gastritis    non erosive  . GERD (gastroesophageal reflux disease)   . Hemorrhoids   . Hiatal hernia   . HLD (hyperlipidemia)   . Hypertension   . Hypothyroidism     Past Surgical History:  Procedure Laterality Date  . BACK SURGERY  1999  . BLADDER SURGERY    . COLONOSCOPY  2009  . ESOPHAGOGASTRODUODENOSCOPY  2008  . SHOULDER SURGERY Right 2012  . SHOULDER SURGERY Left 2014   x2    Family History  Problem Relation Age of Onset  . Diabetes Father        Type II  . Brain cancer Father   . Heart disease Father   . Heart disease Mother   . Colon polyps Sister   . Cancer Other   . Hypertension Other   . Colon cancer Neg Hx   . Kidney disease Neg Hx   . Esophageal cancer Neg Hx     Social History   Socioeconomic  History  . Marital status: Married    Spouse name: Not on file  . Number of children: 0  . Years of education: Not on file  . Highest education level: Not on file  Social Needs  . Financial resource strain: Not on file  . Food insecurity - worry: Not on file  . Food insecurity - inability: Not on file  . Transportation needs - medical: Not on file  . Transportation needs - non-medical: Not on file  Occupational History  . Occupation: Aeronautical engineer  Tobacco Use  . Smoking status: Never Smoker  . Smokeless tobacco: Never Used  Substance and Sexual Activity  .  Alcohol use: No    Alcohol/week: 0.0 oz  . Drug use: No  . Sexual activity: Not on file  Other Topics Concern  . Not on file  Social History Narrative   Married no children works as a Aeronautical engineer in [employer]    Current Outpatient Medications  Medication Sig Dispense Refill  . levothyroxine (SYNTHROID, LEVOTHROID) 100 MCG tablet Take 100 mcg by mouth daily before breakfast.    . lisinopril (PRINIVIL,ZESTRIL) 20 MG tablet Take 20 mg by mouth daily.    . Multiple Vitamins-Minerals (MULTIVITAMIN WITH MINERALS) tablet Take 1 tablet by mouth daily.    . simvastatin (ZOCOR) 20 MG tablet Take 20 mg by mouth at bedtime.     No current facility-administered medications for this visit.     No Known Allergies    Review of Systems:   General:  normal appetite, normal energy, no weight gain, no weight loss, no fever  Cardiac:  + chest pain with exertion, no chest pain at rest, + SOB with exertion, no resting SOB, no PND, no orthopnea, no palpitations, no arrhythmia, no atrial fibrillation, no LE edema, + dizzy spells, no syncope  Respiratory:  no shortness of breath, no home oxygen, no productive cough, no dry cough, no bronchitis, no wheezing, no hemoptysis, no asthma, no pain with inspiration or cough, no sleep apnea, no CPAP at night  GI:   no difficulty swallowing, no reflux, no frequent heartburn, no hiatal hernia, no abdominal pain, no constipation, no diarrhea, no hematochezia, no hematemesis, no melena  GU:   no dysuria,  no frequency, no urinary tract infection, no hematuria, no enlarged prostate, no kidney stones, no kidney disease  Vascular:  no pain suggestive of claudication, no pain in feet, no leg cramps, no varicose veins, no DVT, no non-healing foot ulcer  Neuro:   no stroke, no TIA's, no seizures, no headaches, no temporary blindness one eye,  no slurred speech, no peripheral neuropathy, no chronic pain, no instability of gait, no memory/cognitive  dysfunction  Musculoskeletal: mild arthritis, no joint swelling, no myalgias, no difficulty walking, normal mobility   Skin:   no rash, no itching, no skin infections, no pressure sores or ulcerations  Psych:   no anxiety, no depression, no nervousness, no unusual recent stress  Eyes:   no blurry vision, no floaters, no recent vision changes, + wears glasses or contacts  ENT:   no hearing loss, no loose or painful teeth, no dentures, last saw dentist within the past 6 months  Hematologic:  no easy bruising, no abnormal bleeding, no clotting disorder, no frequent epistaxis  Endocrine:  no diabetes, does not check CBG's at home           Physical Exam:   BP 134/74 (BP Location: Left Arm, Patient Position: Sitting)   Pulse (!) 56  Ht 5' 5"  (1.651 m)   Wt 160 lb (72.6 kg)   SpO2 99%   BMI 26.63 kg/m   General:    well-appearing  HEENT:  Unremarkable   Neck:   no JVD, no bruits, no adenopathy   Chest:   clear to auscultation, symmetrical breath sounds, no wheezes, no rhonchi   CV:   RRR, grade III/VI crescendo/decrescendo murmur heard best at RSB,  no diastolic murmur  Abdomen:  soft, non-tender, no masses   Extremities:  warm, well-perfused, pulses palpable, no LE edema  Rectal/GU  Deferred  Neuro:   Grossly non-focal and symmetrical throughout  Skin:   Clean and dry, no rashes, no breakdown   Diagnostic Tests:  Echocardiography  Patient:    James Wilkins, James Wilkins MR #:       [id number] Study Date: 07/10/2017 Gender:     M Age:        52 Height:     165.7 cm Weight:     74.8 kg BSA:        1.87 m^2 Pt. Status: Room:   ATTENDING    Lamarr Lulas  SONOGRAPHER  Wyatt Mage, RDCS  PERFORMING   [facility], Outpatient  cc:  ------------------------------------------------------------------- LV EF: 55% -   60%  ------------------------------------------------------------------- Indications:      Murmur  (R01.1).  ------------------------------------------------------------------- History:   Risk factors:  Dyslipidemia.  ------------------------------------------------------------------- Study Conclusions  - Left ventricle: The cavity size was normal. Systolic function was   normal. The estimated ejection fraction was in the range of 55%   to 60%. Wall motion was normal; there were no regional wall   motion abnormalities. Left ventricular diastolic function   parameters were normal. Doppler parameters are consistent with   indeterminate ventricular filling pressure. - Aortic valve: Bicuspid; severely thickened, severely calcified   leaflets. There appears to be fusion of the non and left coronary   cusps. Valve mobility was restricted. There was severe stenosis.   There was no regurgitation. Peak velocity (S): 434 cm/s. Mean   gradient (S): 45 mm Hg. Peak gradient (S): 75 mm Hg. - Aorta: Ascending aortic diameter: 38 mm (S). - Ascending aorta: The ascending aorta was mildly dilated. - Mitral valve: Transvalvular velocity was within the normal range.   There was no evidence for stenosis. There was trivial   regurgitation. - Right ventricle: The cavity size was mildly dilated. Wall   thickness was normal. - Tricuspid valve: There was mild regurgitation. - Pulmonary arteries: Systolic pressure was within the normal   range. PA peak pressure: 30 mm Hg (S).  ------------------------------------------------------------------- Study data:  No prior study was available for comparison.  Study status:  Routine.  Procedure:  The patient reported no pain pre or post test. Transthoracic echocardiography. Image quality was adequate.  Study completion:  There were no complications. Echocardiography.  M-mode, complete 2D, 3D, spectral Doppler, and color Doppler.  Birthdate:  Patient birthdate: [birth year].  Age: Patient is 56 yr old.  Sex:  Gender: male.    BMI: 27.3 kg/m^2. Blood pressure:      137/76  Patient status:  Outpatient.  Study date:  Study date: 07/10/2017. Study time: 03:52 PM.  Location: [facility]  -------------------------------------------------------------------  ------------------------------------------------------------------- Left ventricle:  The cavity size was normal. Systolic function was normal. The estimated ejection fraction was in the range of 55% to 60%. Wall motion was normal; there were  no regional wall motion abnormalities. The transmitral flow pattern was normal. The deceleration time of the early transmitral flow velocity was normal. The pulmonary vein flow pattern was normal. The tissue Doppler parameters were normal. Left ventricular diastolic function parameters were normal. Doppler parameters are consistent with indeterminate ventricular filling pressure.  ------------------------------------------------------------------- Aortic valve:  Bicuspid; severely thickened, severely calcified leaflets. There appears to be fusion of the non and left coronary cusps. Valve mobility was restricted.  Doppler:   There was severe stenosis.   There was no regurgitation.    VTI ratio of LVOT to aortic valve: 0.32. Valve area (VTI): 1.1 cm^2. Indexed valve area (VTI): 0.59 cm^2/m^2. Peak velocity ratio of LVOT to aortic valve: 0.31. Valve area (Vmax): 1.08 cm^2. Indexed valve area (Vmax): 0.58 cm^2/m^2. Mean velocity ratio of LVOT to aortic valve: 0.31. Valve area (Vmean): 1.06 cm^2. Indexed valve area (Vmean): 0.57 cm^2/m^2.    Mean gradient (S): 45 mm Hg. Peak gradient (S): 75 mm Hg.  ------------------------------------------------------------------- Aorta:  Aortic root: The aortic root was normal in size. Ascending aorta: The ascending aorta was mildly dilated.  ------------------------------------------------------------------- Mitral valve:   Structurally normal valve.   Mobility was not restricted.  Doppler:  Transvalvular  velocity was within the normal range. There was no evidence for stenosis. There was trivial regurgitation.    Peak gradient (D): 3 mm Hg.  ------------------------------------------------------------------- Left atrium:  The atrium was normal in size.  ------------------------------------------------------------------- Right ventricle:  The cavity size was mildly dilated. Wall thickness was normal. Systolic function was normal.  ------------------------------------------------------------------- Pulmonic valve:    Structurally normal valve.   Cusp separation was normal.  Doppler:  Transvalvular velocity was within the normal range. There was no evidence for stenosis. There was no regurgitation.  ------------------------------------------------------------------- Tricuspid valve:   Structurally normal valve.    Doppler: Transvalvular velocity was within the normal range. There was mild regurgitation.  ------------------------------------------------------------------- Pulmonary artery:   The main pulmonary artery was normal-sized. Systolic pressure was within the normal range.  ------------------------------------------------------------------- Right atrium:  The atrium was normal in size.  ------------------------------------------------------------------- Pericardium:  There was no pericardial effusion.  ------------------------------------------------------------------- Systemic veins: Inferior vena cava: The vessel was normal in size. The respirophasic diameter changes were blunted (< 50%), consistent with elevated central venous pressure.  ------------------------------------------------------------------- Measurements   Left ventricle                            Value          Reference  LV ID, ED, PLAX chordal           (L)     40.6  mm       43 - 52  LV ID, ES, PLAX chordal                   28.6  mm       23 - 38  LV fx shortening, PLAX chordal             30    %        >=29  LV PW thickness, ED                       8.66  mm       ---------  IVS/LV PW ratio, ED  0.98           <=1.3  Stroke volume, 2D                         111   ml       ---------  Stroke volume/bsa, 2D                     59    ml/m^2   ---------  LV e&', lateral                            9.25  cm/s     ---------  LV E/e&', lateral                          8.81           ---------  LV e&', medial                             8.16  cm/s     ---------  LV E/e&', medial                           9.99           ---------  LV e&', average                            8.71  cm/s     ---------  LV E/e&', average                          9.36           ---------    Ventricular septum                        Value          Reference  IVS thickness, ED                         8.47  mm       ---------    LVOT                                      Value          Reference  LVOT ID, S                                21    mm       ---------  LVOT area                                 3.46  cm^2     ---------  LVOT peak velocity, S                     135   cm/s     ---------  LVOT mean velocity, S  97    cm/s     ---------  LVOT VTI, S                               32.1  cm       ---------  LVOT peak gradient, S                     7     mm Hg    ---------    Aortic valve                              Value          Reference  Aortic valve peak velocity, S             434   cm/s     ---------  Aortic valve mean velocity, S             317   cm/s     ---------  Aortic valve VTI, S                       101   cm       ---------  Aortic mean gradient, S                   45    mm Hg    ---------  Aortic peak gradient, S                   75    mm Hg    ---------  VTI ratio, LVOT/AV                        0.32           ---------  Aortic valve area, VTI                    1.1   cm^2     ---------  Aortic valve area/bsa, VTI                0.59   cm^2/m^2 ---------  Velocity ratio, peak, LVOT/AV             0.31           ---------  Aortic valve area, peak velocity          1.08  cm^2     ---------  Aortic valve area/bsa, peak               0.58  cm^2/m^2 ---------  velocity  Velocity ratio, mean, LVOT/AV             0.31           ---------  Aortic valve area, mean velocity          1.06  cm^2     ---------  Aortic valve area/bsa, mean               0.57  cm^2/m^2 ---------  velocity    Aorta                                     Value          Reference  Aortic root ID, ED  36    mm       ---------  Ascending aorta ID, A-P, S                38    mm       ---------    Left atrium                               Value          Reference  LA ID, A-P, ES                            31    mm       ---------  LA ID/bsa, A-P                            1.65  cm/m^2   <=2.2  LA volume, S                              45.5  ml       ---------  LA volume/bsa, S                          24.3  ml/m^2   ---------  LA volume, ES, 1-p A4C                    40.7  ml       ---------  LA volume/bsa, ES, 1-p A4C                21.7  ml/m^2   ---------  LA volume, ES, 1-p A2C                    47.3  ml       ---------  LA volume/bsa, ES, 1-p A2C                25.2  ml/m^2   ---------    Mitral valve                              Value          Reference  Mitral E-wave peak velocity               81.5  cm/s     ---------  Mitral A-wave peak velocity               61.6  cm/s     ---------  Mitral deceleration time          (H)     285   ms       150 - 230  Mitral peak gradient, D                   3     mm Hg    ---------  Mitral E/A ratio, peak                    1.3            ---------    Pulmonary arteries  Value          Reference  PA pressure, S, DP                        30    mm Hg    <=30    Tricuspid valve                           Value          Reference  Tricuspid regurg peak velocity             232   cm/s     ---------  Tricuspid peak RV-RA gradient             22    mm Hg    ---------    Systemic veins                            Value          Reference  Estimated CVP                             8     mm Hg    ---------    Right ventricle                           Value          Reference  TAPSE                                     15.6  mm       ---------  RV pressure, S, DP                        30    mm Hg    <=30  RV s&', lateral, S                         14    cm/s     ---------  Legend: (L)  and  (H)  mark values outside specified reference range.  ------------------------------------------------------------------- Prepared and Electronically Authenticated by  Skeet Latch, MD 2018-10-05T18:12:35    CT ANGIOGRAPHY CHEST WITH CONTRAST  TECHNIQUE: Multidetector CT imaging of the chest was performed using the standard protocol during bolus administration of intravenous contrast. Multiplanar CT image reconstructions and MIPs were obtained to evaluate the vascular anatomy.  CONTRAST:  100 cc Isovue 370  COMPARISON:  None.  FINDINGS: Vascular Findings:  There is mild fusiform aneurysmal dilatation of the ascending thoracic aorta with measurements as follows. The thoracic aorta tapers to a normal caliber at the level of the aortic arch. Scattered atherosclerotic plaque within the thoracic aorta, not resulting in a hemodynamically significant stenosis. No definite evidence of thoracic aortic dissection or periaortic stranding on this nongated examination.  Conventional configuration of the aortic arch. The branch vessels of the aortic arch appear widely patent throughout their imaged course.  Normal heart size. Calcifications within the aortic valve leaflets compatible provided history of aortic stenosis. No pericardial effusion.  Although this examination was not tailored for the evaluation the pulmonary arteries, there are no  discrete filling defects within the central  pulmonary arterial tree to suggest central pulmonary embolism. Normal caliber of the main pulmonary artery.  -------------------------------------------------------------  Thoracic aortic measurements:  Sinotubular junction  32 mm as measured in greatest oblique coronal dimension.  Proximal ascending aorta  41 mm as measured in greatest oblique axial dimension at the level of the main pulmonary artery (image 43, series 4)  Aortic arch aorta  24 mm  as measured in greatest oblique sagittal dimension.  Proximal descending thoracic aorta  23 mm as measured in greatest oblique axial dimension at the level of the main pulmonary artery.  Distal descending thoracic aorta  22 mm as measured in greatest oblique axial dimension at the level of the diaphragmatic hiatus.  Review of the MIP images confirms the above findings.  -------------------------------------------------------------  Non-Vascular Findings:  Mediastinum/Lymph Nodes: No bulky mediastinal, hilar or axillary lymphadenopathy.  Lungs/Pleura: Minimal tree-in-bud type opacification involving the superior segment of the right lower lobe with associated faint internal calcification (image 49, series 5) compatible with localized post inflammatory change. No discrete focal airspace opacities. No pleural effusion or pneumothorax. The central pulmonary airways appear widely patent.  No discrete worrisome pulmonary nodules.  Upper abdomen: Limited early arterial phase evaluation of the upper abdomen demonstrates a peripherally enhancing nodule within the dome of the right lobe of the liver which measures approximately 1.2 x 1.0 x 1.0 cm (axial image 70, series 4, coronal image 40, series 7, which is incompletely evaluated present examination though appears to demonstrate peripheral nodular interrupted enhancement and is favored to represent a benign  hepatic hemangioma. Small hiatal hernia.  Musculoskeletal: No acute or aggressive osseous abnormalities. Stigmata of DISH within the thoracic spine.  IMPRESSION: 1. Mild fusiform aneurysmal dilatation of the ascending thoracic aorta measuring 4 cm in maximal diameter. Recommend annual imaging followup by CTA or MRA. This recommendation follows 2010 ACCF/AHA/AATS/ACR/ASA/SCA/SCAI/SIR/STS/SVM Guidelines for the Diagnosis and Management of Patients with Thoracic Aortic Disease. Circulation. 2010; 121: W258-N277. Aortic aneurysm NOS (ICD10-I71.9). 2.  Aortic Atherosclerosis (ICD10-I70.0). 3. Calcifications within the aortic valve leaflets compatible provided history of aortic stenosis. 4. Incidentally noted approximately 1.2 cm lesion within the dome of the right lobe of the liver, incompletely characterize though in the absence of a known cirrhosis and/or a primary malignancy is favored to represent a hepatic hemangioma. Further evaluation with right upper quadrant abdominal ultrasound could be performed as indicated.   Electronically Signed   By: Sandi Mariscal M.D.   On: 08/05/2017 16:59   STS Risk Calculator Procedure: AV Replacement  Risk of Mortality: 0.916%  Morbidity or Mortality: 10.238%  Long Length of Stay: 2.803%  Short Length of Stay: 60.309%  Permanent Stroke: 0.627%  Prolonged Ventilation: 4.775%  DSW Infection: 0.109%  Renal Failure: 1.789%  Reoperation: 5.569%      Impression:  Patient has functionally bicuspid aortic valve with stage D severe symptomatic aortic stenosis.  He describes a 3-year history of mild symptoms of exertional shortness of breath and chest tightness consistent with chronic diastolic congestive heart failure, [place] Heart Association functional class I-II.  I have personally reviewed the patient's recent transthoracic echocardiogram.  Patient appears to have a Sievers type I bicuspid aortic valve.  There is severe thickening,  moderate calcification, and restricted leaflet mobility.  Peak velocity across the aortic valve measured 4.3 m/s corresponding to a mean transvalvular gradient estimated 45 mmHg.  Left ventricular systolic function remains normal.  The aortic root appears normal sized.  There is moderate fusiform dilatation of the descending thoracic aorta  with maximum transverse diameter measured 4.1 cm.  I agree the patient needs elective aortic valve replacement.  Risks associated with conventional surgery should be quite low.  Diagnostic cardiac catheterization has been scheduled to rule out presence of significant coronary artery disease.  In the absence of significant coronary artery disease the patient appears to be a good candidate for minimally invasive approach for surgery.   Plan:  The patient and his wife were counseled at length regarding treatment alternatives for management of severe aortic stenosis including continued medical therapy versus proceeding with aortic valve replacement in the near future.  The natural history of aortic stenosis was reviewed, as was long term prognosis with medical therapy alone.  Surgical options were discussed at length including conventional surgical aortic valve replacement through either a full median sternotomy or using minimally invasive techniques.  Other alternatives including rapid-deployment bioprosthetic tissue valve replacement, transcatheter aortic valve replacement, patch enlargement of the aortic root, stentless porcine aortic root replacement, valve repair, the Ross autograft procedure, and homograft aortic root replacement were discussed.  Discussion was held comparing the relative risks of mechanical valve replacement with need for lifelong anticoagulation versus use of a bioprosthetic tissue valve and the associated potential for late structural valve deterioration and failure.  Expectations for his postoperative convalescence have been discussed.  The patient  hopes to proceed with elective surgery in the near future.  We tentatively plan to proceed with aortic valve replacement on Friday, September 11, 2017.  The patient will return to our office prior to surgery on Monday, September 07, 2017 at which time we will review the results of his diagnostic cardiac catheterization and make final plans for surgery.    Valentina Gu. Roxy Manns, MD 08/10/2017 11:43 AM

## 2017-08-10 NOTE — Telephone Encounter (Signed)
Left detailed message per DPR:  Patient contacted pre-catheterization at [facility] scheduled for:  08/12/2017 @ 0830 Verified arrival time and place:  NT @ 0630 Confirmed AM meds to be taken pre-cath with sip of water: Take ASA Patient must have responsible person to drive home post procedure and observe patient for 24 hours Addl concerns:  none

## 2017-08-11 ENCOUNTER — Telehealth: Payer: Self-pay | Admitting: Cardiovascular Disease

## 2017-08-11 DIAGNOSIS — K769 Liver disease, unspecified: Secondary | ICD-10-CM

## 2017-08-11 NOTE — Telephone Encounter (Signed)
Informed patient of results and verbal understanding expressed.  RUQ [place] ordered for scheduling. Patient agrees with treatment plan.

## 2017-08-11 NOTE — Telephone Encounter (Signed)
-----   Message from [name], MD sent at 08/10/2017  9:45 PM EST ----- Aorta mildly dilated. Annual FU recommended. Please order RUQ ultrasound to further evaluate incidental liver finding as recommended.

## 2017-08-11 NOTE — Telephone Encounter (Signed)
Mr.Busbee is returning a call about test results . Please call

## 2017-08-12 ENCOUNTER — Ambulatory Visit (HOSPITAL_COMMUNITY)
Admission: RE | Admit: 2017-08-12 | Discharge: 2017-08-12 | Disposition: A | Discharge status: Home / Self Care | Payer: No Typology Code available for payment source | Source: Ambulatory Visit | Attending: Cardiovascular Disease | Admitting: Cardiovascular Disease

## 2017-08-12 ENCOUNTER — Other Ambulatory Visit: Payer: Self-pay

## 2017-08-12 ENCOUNTER — Encounter (HOSPITAL_COMMUNITY)
Admission: RE | Disposition: A | Discharge status: Home / Self Care | Payer: Self-pay | Source: Ambulatory Visit | Attending: Cardiovascular Disease

## 2017-08-12 DIAGNOSIS — I10 Essential (primary) hypertension: Secondary | ICD-10-CM | POA: Insufficient documentation

## 2017-08-12 DIAGNOSIS — E785 Hyperlipidemia, unspecified: Secondary | ICD-10-CM | POA: Diagnosis not present

## 2017-08-12 DIAGNOSIS — E039 Hypothyroidism, unspecified: Secondary | ICD-10-CM | POA: Diagnosis not present

## 2017-08-12 DIAGNOSIS — I350 Nonrheumatic aortic (valve) stenosis: Secondary | ICD-10-CM | POA: Insufficient documentation

## 2017-08-12 DIAGNOSIS — K900 Celiac disease: Secondary | ICD-10-CM | POA: Insufficient documentation

## 2017-08-12 DIAGNOSIS — I2510 Atherosclerotic heart disease of native coronary artery without angina pectoris: Secondary | ICD-10-CM | POA: Insufficient documentation

## 2017-08-12 DIAGNOSIS — Z79899 Other long term (current) drug therapy: Secondary | ICD-10-CM | POA: Diagnosis not present

## 2017-08-12 HISTORY — PX: CARDIAC CATHETERIZATION: SHX172

## 2017-08-12 LAB — POCT I-STAT 3, ART BLOOD GAS (G3+)
ACID-BASE EXCESS: 1 mmol/L (ref 0.0–2.0)
BICARBONATE: 27.3 mmol/L (ref 20.0–28.0)
O2 SAT: 96 %
PO2 ART: 87 mmHg (ref 83.0–108.0)
TCO2: 29 mmol/L (ref 22–32)
pCO2 arterial: 50.5 mmHg — ABNORMAL HIGH (ref 32.0–48.0)
pH, Arterial: 7.341 — ABNORMAL LOW (ref 7.350–7.450)

## 2017-08-12 LAB — POCT I-STAT 3, VENOUS BLOOD GAS (G3P V)
Acid-Base Excess: 3 mmol/L — ABNORMAL HIGH (ref 0.0–2.0)
BICARBONATE: 29.9 mmol/L — AB (ref 20.0–28.0)
O2 Saturation: 69 %
PCO2 VEN: 56.3 mmHg (ref 44.0–60.0)
TCO2: 32 mmol/L (ref 22–32)
pH, Ven: 7.332 (ref 7.250–7.430)
pO2, Ven: 39 mmHg (ref 32.0–45.0)

## 2017-08-12 SURGERY — RIGHT HEART CATH AND CORONARY ANGIOGRAPHY
Anesthesia: LOCAL

## 2017-08-12 MED ORDER — SODIUM CHLORIDE 0.9 % IV SOLN: 250.0000 mL | INTRAVENOUS | Status: DC | PRN

## 2017-08-12 MED ORDER — IOPAMIDOL (ISOVUE-370) INJECTION 76%
INTRAVENOUS | Status: DC | PRN
  Administered 2017-08-12: 50 mL via INTRA_ARTERIAL

## 2017-08-12 MED ORDER — SODIUM CHLORIDE 0.9% FLUSH: 3.0000 mL | Freq: Two times a day (BID) | INTRAVENOUS | Status: DC

## 2017-08-12 MED ORDER — FENTANYL CITRATE (PF) 100 MCG/2ML IJ SOLN
INTRAMUSCULAR | Status: DC | PRN
  Administered 2017-08-12: 50 ug via INTRAVENOUS

## 2017-08-12 MED ORDER — MIDAZOLAM HCL 2 MG/2ML IJ SOLN
INTRAMUSCULAR | Status: DC | PRN
  Administered 2017-08-12: 2 mg via INTRAVENOUS

## 2017-08-12 MED ORDER — SODIUM CHLORIDE 0.9 % WEIGHT BASED INFUSION
3.0000 mL/kg/h | INTRAVENOUS | Status: AC
  Administered 2017-08-12: 3 mL/kg/h via INTRAVENOUS

## 2017-08-12 MED ORDER — HEPARIN SODIUM (PORCINE) 1000 UNIT/ML IJ SOLN
INTRAMUSCULAR | Status: AC
  Filled 2017-08-12: qty 1

## 2017-08-12 MED ORDER — ASPIRIN 81 MG PO CHEW: 81.0000 mg | CHEWABLE_TABLET | Freq: Every day | ORAL | Status: DC

## 2017-08-12 MED ORDER — VERAPAMIL HCL 2.5 MG/ML IV SOLN
INTRAVENOUS | Status: AC
  Filled 2017-08-12: qty 2

## 2017-08-12 MED ORDER — ASPIRIN 81 MG PO CHEW: 81.0000 mg | CHEWABLE_TABLET | ORAL | Status: DC

## 2017-08-12 MED ORDER — LIDOCAINE HCL (PF) 1 % IJ SOLN
INTRAMUSCULAR | Status: AC
  Filled 2017-08-12: qty 30

## 2017-08-12 MED ORDER — SODIUM CHLORIDE 0.9% FLUSH: 3.0000 mL | INTRAVENOUS | Status: DC | PRN

## 2017-08-12 MED ORDER — HEPARIN (PORCINE) IN NACL 2-0.9 UNIT/ML-% IJ SOLN
INTRAMUSCULAR | Status: AC
  Filled 2017-08-12: qty 1000

## 2017-08-12 MED ORDER — SODIUM CHLORIDE 0.9 % WEIGHT BASED INFUSION: 1.0000 mL/kg/h | INTRAVENOUS | Status: DC

## 2017-08-12 MED ORDER — MIDAZOLAM HCL 2 MG/2ML IJ SOLN
INTRAMUSCULAR | Status: AC
  Filled 2017-08-12: qty 2

## 2017-08-12 MED ORDER — HEPARIN SODIUM (PORCINE) 1000 UNIT/ML IJ SOLN
INTRAMUSCULAR | Status: DC | PRN
Start: 2017-08-12 — End: 2017-08-12
  Administered 2017-08-12: 4000 [IU] via INTRAVENOUS

## 2017-08-12 MED ORDER — IOPAMIDOL (ISOVUE-370) INJECTION 76%
INTRAVENOUS | Status: AC
  Filled 2017-08-12: qty 100

## 2017-08-12 MED ORDER — LIDOCAINE HCL (PF) 1 % IJ SOLN
INTRAMUSCULAR | Status: DC | PRN
  Administered 2017-08-12 (×2): 2 mL via SUBCUTANEOUS

## 2017-08-12 MED ORDER — FENTANYL CITRATE (PF) 100 MCG/2ML IJ SOLN
INTRAMUSCULAR | Status: AC
  Filled 2017-08-12: qty 2

## 2017-08-12 MED ORDER — VERAPAMIL HCL 2.5 MG/ML IV SOLN
INTRAVENOUS | Status: DC | PRN
  Administered 2017-08-12: 10 mL via INTRA_ARTERIAL

## 2017-08-12 MED ORDER — HEPARIN (PORCINE) IN NACL 2-0.9 UNIT/ML-% IJ SOLN
INTRAMUSCULAR | Status: AC | PRN
  Administered 2017-08-12: 1000 mL

## 2017-08-12 SURGICAL SUPPLY — 13 items
CATH 5FR JL3.5 JR4 ANG PIG MP (CATHETERS) ×2 IMPLANT
CATH BALLN WEDGE 5F 110CM (CATHETERS) ×2 IMPLANT
CATH INFINITI 5FR JL4 (CATHETERS) ×2 IMPLANT
DEVICE RAD COMP TR BAND LRG (VASCULAR PRODUCTS) ×2 IMPLANT
GLIDESHEATH SLEND SS 6F .021 (SHEATH) ×2 IMPLANT
GUIDEWIRE .025 260CM (WIRE) ×2 IMPLANT
GUIDEWIRE INQWIRE 1.5J.035X260 (WIRE) ×1 IMPLANT
INQWIRE 1.5J .035X260CM (WIRE) ×2
KIT HEART LEFT (KITS) ×2 IMPLANT
PACK CARDIAC CATHETERIZATION (CUSTOM PROCEDURE TRAY) ×2 IMPLANT
SHEATH GLIDE SLENDER 4/5FR (SHEATH) ×2 IMPLANT
TRANSDUCER W/STOPCOCK (MISCELLANEOUS) ×2 IMPLANT
TUBING CIL FLEX 10 FLL-RA (TUBING) ×2 IMPLANT

## 2017-08-12 NOTE — Discharge Instructions (Signed)

## 2017-08-12 NOTE — Interval H&P Note (Signed)
History and Physical Interval Note:  08/12/2017 8:30 AM  James Wilkins  has presented today for surgery, with the diagnosis of Aortic Stenosis  The various methods of treatment have been discussed with the patient and family. After consideration of risks, benefits and other options for treatment, the patient has consented to  Procedure(s): RIGHT/LEFT HEART CATH AND CORONARY ANGIOGRAPHY (N/A) as a surgical intervention .  The patient's history has been reviewed, patient examined, no change in status, stable for surgery.  I have reviewed the patient's chart and labs.  Questions were answered to the patient's satisfaction.     [name]

## 2017-08-14 ENCOUNTER — Ambulatory Visit (HOSPITAL_COMMUNITY)
Admission: RE | Admit: 2017-08-14 | Discharge: 2017-08-14 | Disposition: A | Discharge status: Home / Self Care | Payer: 59 | Source: Ambulatory Visit | Attending: Cardiovascular Disease | Admitting: Cardiovascular Disease

## 2017-08-14 DIAGNOSIS — K769 Liver disease, unspecified: Secondary | ICD-10-CM | POA: Diagnosis not present

## 2017-08-20 ENCOUNTER — Telehealth: Payer: Self-pay | Admitting: Cardiovascular Disease

## 2017-08-20 NOTE — Telephone Encounter (Signed)
Disability Forms Rec'd from [facility] - put in Dr. Antionette Char box 08/20/17 LM

## 2017-08-21 ENCOUNTER — Encounter (HOSPITAL_COMMUNITY): Payer: Self-pay

## 2017-08-21 ENCOUNTER — Ambulatory Visit (HOSPITAL_COMMUNITY)
Admission: RE | Admit: 2017-08-21 | Discharge: 2017-08-21 | Disposition: A | Discharge status: Home / Self Care | Payer: 59 | Source: Ambulatory Visit | Attending: Thoracic Surgery (Cardiothoracic Vascular Surgery) | Admitting: Thoracic Surgery (Cardiothoracic Vascular Surgery)

## 2017-08-21 ENCOUNTER — Ambulatory Visit (HOSPITAL_COMMUNITY)
Admit: 2017-08-21 | Discharge: 2017-08-21 | Disposition: A | Discharge status: Home / Self Care | Payer: 59 | Attending: Thoracic Surgery (Cardiothoracic Vascular Surgery) | Admitting: Thoracic Surgery (Cardiothoracic Vascular Surgery)

## 2017-08-21 ENCOUNTER — Encounter (HOSPITAL_COMMUNITY)
Admit: 2017-08-21 | Discharge: 2017-08-21 | Disposition: A | Discharge status: Home / Self Care | Payer: 59 | Attending: Thoracic Surgery (Cardiothoracic Vascular Surgery) | Admitting: Thoracic Surgery (Cardiothoracic Vascular Surgery)

## 2017-08-21 ENCOUNTER — Encounter: Payer: 59 | Admitting: Thoracic Surgery (Cardiothoracic Vascular Surgery)

## 2017-08-21 DIAGNOSIS — Z01812 Encounter for preprocedural laboratory examination: Secondary | ICD-10-CM | POA: Diagnosis present

## 2017-08-21 DIAGNOSIS — Z01810 Encounter for preprocedural cardiovascular examination: Secondary | ICD-10-CM | POA: Insufficient documentation

## 2017-08-21 DIAGNOSIS — Z01818 Encounter for other preprocedural examination: Secondary | ICD-10-CM

## 2017-08-21 DIAGNOSIS — R001 Bradycardia, unspecified: Secondary | ICD-10-CM | POA: Diagnosis not present

## 2017-08-21 DIAGNOSIS — I700 Atherosclerosis of aorta: Secondary | ICD-10-CM | POA: Insufficient documentation

## 2017-08-21 DIAGNOSIS — I350 Nonrheumatic aortic (valve) stenosis: Secondary | ICD-10-CM | POA: Diagnosis not present

## 2017-08-21 HISTORY — DX: Dyspnea, unspecified: R06.00

## 2017-08-21 LAB — PULMONARY FUNCTION TEST
DL/VA % pred: 129 %
DL/VA: 5.56 ml/min/mmHg/L
DLCO unc % pred: 107 %
DLCO unc: 27.39 ml/min/mmHg
FEF 25-75 Post: 2.11 L/sec
FEF 25-75 Pre: 2.8 L/sec
FEF2575-%CHANGE-POST: -24 %
FEF2575-%PRED-PRE: 104 %
FEF2575-%Pred-Post: 79 %
FEV1-%CHANGE-POST: -13 %
FEV1-%PRED-POST: 78 %
FEV1-%Pred-Pre: 90 %
FEV1-PRE: 2.8 L
FEV1-Post: 2.42 L
FEV1FVC-%CHANGE-POST: -14 %
FEV1FVC-%Pred-Pre: 107 %
FEV6-%CHANGE-POST: 0 %
FEV6-%Pred-Post: 89 %
FEV6-%Pred-Pre: 88 %
FEV6-Post: 3.44 L
FEV6-Pre: 3.42 L
FEV6FVC-%PRED-PRE: 105 %
FEV6FVC-%Pred-Post: 105 %
FVC-%CHANGE-POST: 0 %
FVC-%Pred-Post: 85 %
FVC-%Pred-Pre: 84 %
FVC-PRE: 3.42 L
FVC-Post: 3.44 L
POST FEV1/FVC RATIO: 70 %
PRE FEV6/FVC RATIO: 100 %
Post FEV6/FVC ratio: 100 %
Pre FEV1/FVC ratio: 82 %
RV % pred: 130 %
RV: 2.48 L
TLC % pred: 104 %
TLC: 6.22 L

## 2017-08-21 LAB — HEMOGLOBIN A1C
HEMOGLOBIN A1C: 5.5 % (ref 4.8–5.6)
Mean Plasma Glucose: 111.15 mg/dL

## 2017-08-21 LAB — CBC
HCT: 43.9 % (ref 39.0–52.0)
Hemoglobin: 15.6 g/dL (ref 13.0–17.0)
MCH: 31.6 pg (ref 26.0–34.0)
MCHC: 35.5 g/dL (ref 30.0–36.0)
MCV: 88.9 fL (ref 78.0–100.0)
Platelets: 212 10*3/uL (ref 150–400)
RBC: 4.94 MIL/uL (ref 4.22–5.81)
RDW: 12.6 % (ref 11.5–15.5)
WBC: 5.9 10*3/uL (ref 4.0–10.5)

## 2017-08-21 LAB — TYPE AND SCREEN
ABO/RH(D): O POS
Antibody Screen: NEGATIVE

## 2017-08-21 LAB — BLOOD GAS, ARTERIAL
ACID-BASE EXCESS: 2.9 mmol/L — AB (ref 0.0–2.0)
Bicarbonate: 27.1 mmol/L (ref 20.0–28.0)
DRAWN BY: 449841
FIO2: 21
O2 Saturation: 92.5 %
PCO2 ART: 42.6 mmHg (ref 32.0–48.0)
PH ART: 7.42 (ref 7.350–7.450)
Patient temperature: 98.6
pO2, Arterial: 63.7 mmHg — ABNORMAL LOW (ref 83.0–108.0)

## 2017-08-21 LAB — COMPREHENSIVE METABOLIC PANEL
ALK PHOS: 79 U/L (ref 38–126)
ALT: 38 U/L (ref 17–63)
AST: 39 U/L (ref 15–41)
Albumin: 4.1 g/dL (ref 3.5–5.0)
Anion gap: 7 (ref 5–15)
BILIRUBIN TOTAL: 0.4 mg/dL (ref 0.3–1.2)
BUN: 19 mg/dL (ref 6–20)
CALCIUM: 9.5 mg/dL (ref 8.9–10.3)
CO2: 24 mmol/L (ref 22–32)
CREATININE: 1.2 mg/dL (ref 0.61–1.24)
Chloride: 106 mmol/L (ref 101–111)
GFR calc Af Amer: >60 mL/min (ref >60)
Glucose, Bld: 90 mg/dL (ref 65–99)
Potassium: 4.4 mmol/L (ref 3.5–5.1)
Sodium: 137 mmol/L (ref 135–145)
TOTAL PROTEIN: 7.8 g/dL (ref 6.5–8.1)

## 2017-08-21 LAB — PROTIME-INR
INR: 1.01
Prothrombin Time: 13.2 seconds (ref 11.4–15.2)

## 2017-08-21 LAB — URINALYSIS, ROUTINE W REFLEX MICROSCOPIC
Bilirubin Urine: NEGATIVE
Glucose, UA: NEGATIVE mg/dL
KETONES UR: NEGATIVE mg/dL
LEUKOCYTES UA: NEGATIVE
Nitrite: NEGATIVE
PH: 8 (ref 5.0–8.0)
PROTEIN: NEGATIVE mg/dL
SPECIFIC GRAVITY, URINE: 1.008 (ref 1.005–1.030)
Squamous Epithelial / LPF: NONE SEEN

## 2017-08-21 LAB — SURGICAL PCR SCREEN
MRSA, PCR: NEGATIVE
Staphylococcus aureus: NEGATIVE

## 2017-08-21 LAB — ABO/RH: ABO/RH(D): O POS

## 2017-08-21 LAB — BRAIN NATRIURETIC PEPTIDE: B NATRIURETIC PEPTIDE 5: 11.4 pg/mL (ref 0.0–100.0)

## 2017-08-21 LAB — APTT: aPTT: 37 seconds — ABNORMAL HIGH (ref 24–36)

## 2017-08-21 MED ORDER — ALBUTEROL SULFATE (2.5 MG/3ML) 0.083% IN NEBU
2.5000 mg | INHALATION_SOLUTION | Freq: Once | RESPIRATORY_TRACT | Status: AC
  Administered 2017-08-21: 2.5 mg via RESPIRATORY_TRACT

## 2017-08-21 NOTE — Progress Notes (Signed)
Pre-op Cardiac Surgery  Carotid Findings:   Bilateral 1-39% ICA stenosis, antegrade vertebral flow.  Upper Extremity Right Left  Brachial Pressures 132, tri 138, tri  Radial Waveforms tri tri  Ulnar Waveforms tri tri  Palmar Arch (Allen's Test) waveform increases less than 50% with radial compression and reverses with ulnar compression waveform increases less than 50% with radial compression and reverses with ulnar compression   Lower  Extremity Right Left  Dorsalis Pedis 114, Tri 141, Tri  Posterior Tibial 134, tri 143, tri  Ankle/Brachial Indices 0.97 1.04   Lita Mains- RDMS, RVT 1:57 PM  08/21/2017

## 2017-08-21 NOTE — Pre-Procedure Instructions (Signed)
    James Wilkins  08/21/2017      [facility] [place], [place] - [address] [facility] [place] [address] Phone: [phone] Fax: [phone]    Your procedure is scheduled on 08/25/17.  Report to [facility] Admitting at 530 A.M.  Call this number if you have problems the morning of surgery:  [phone]   Remember:  Do not eat food or drink liquids after midnight.  Take these medicines the morning of surgery with A SIP OF WATER --synthroid   Do not wear jewelry, make-up or nail polish.  Do not wear lotions, powders, or perfumes, or deoderant.  Do not shave 48 hours prior to surgery.  Men may shave face and neck.  Do not bring valuables to the hospital.  [facility] is not responsible for any belongings or valuables.  Contacts, dentures or bridgework may not be worn into surgery.  Leave your suitcase in the car.  After surgery it may be brought to your room.  For patients admitted to the hospital, discharge time will be determined by your treatment team.  Patients discharged the day of surgery will not be allowed to drive home.   Name and phone number of your driver:    Special instructions:  -stop  All nsaids 7 days prior to surgery  Please read over the following fact sheets that you were given. MRSA Information

## 2017-08-24 ENCOUNTER — Ambulatory Visit: Payer: 59 | Admitting: Thoracic Surgery (Cardiothoracic Vascular Surgery)

## 2017-08-24 ENCOUNTER — Other Ambulatory Visit: Payer: Self-pay

## 2017-08-24 ENCOUNTER — Encounter: Payer: Self-pay | Admitting: Thoracic Surgery (Cardiothoracic Vascular Surgery)

## 2017-08-24 VITALS — BP 115/68 | HR 70 | Ht 65.0 in | Wt 160.0 lb

## 2017-08-24 DIAGNOSIS — Q231 Congenital insufficiency of aortic valve: Secondary | ICD-10-CM

## 2017-08-24 DIAGNOSIS — I25119 Atherosclerotic heart disease of native coronary artery with unspecified angina pectoris: Secondary | ICD-10-CM

## 2017-08-24 DIAGNOSIS — I350 Nonrheumatic aortic (valve) stenosis: Secondary | ICD-10-CM

## 2017-08-24 DIAGNOSIS — I2510 Atherosclerotic heart disease of native coronary artery without angina pectoris: Secondary | ICD-10-CM | POA: Insufficient documentation

## 2017-08-24 MED ORDER — PLASMA-LYTE 148 IV SOLN
INTRAVENOUS | Status: AC
  Administered 2017-08-25: 500 mL
  Filled 2017-08-24: qty 2.5

## 2017-08-24 MED ORDER — MAGNESIUM SULFATE 50 % IJ SOLN
40.0000 meq | INTRAMUSCULAR | Status: DC
  Filled 2017-08-24: qty 9.85

## 2017-08-24 MED ORDER — KENNESTONE BLOOD CARDIOPLEGIA (KBC) MANNITOL SYRINGE (20%, 32ML)
32.0000 mL | Freq: Once | INTRAVENOUS | Status: DC
  Filled 2017-08-24 (×2): qty 32

## 2017-08-24 MED ORDER — SODIUM CHLORIDE 0.9 % IV SOLN
INTRAVENOUS | Status: DC
  Filled 2017-08-24: qty 30

## 2017-08-24 MED ORDER — DEXTROSE 5 % IV SOLN
750.0000 mg | INTRAVENOUS | Status: DC
  Filled 2017-08-24: qty 750

## 2017-08-24 MED ORDER — EPINEPHRINE PF 1 MG/ML IJ SOLN
0.0000 ug/min | INTRAVENOUS | Status: DC
  Filled 2017-08-24: qty 4

## 2017-08-24 MED ORDER — DEXTROSE 5 % IV SOLN
1.5000 g | INTRAVENOUS | Status: AC
  Administered 2017-08-25: .75 g via INTRAVENOUS
  Administered 2017-08-25: 1.5 g via INTRAVENOUS
  Filled 2017-08-24: qty 1.5

## 2017-08-24 MED ORDER — CHLORHEXIDINE GLUCONATE 0.12 % MT SOLN
15.0000 mL | Freq: Once | OROMUCOSAL | Status: AC
  Administered 2017-08-25: 15 mL via OROMUCOSAL
  Filled 2017-08-24: qty 15

## 2017-08-24 MED ORDER — VANCOMYCIN HCL 10 G IV SOLR
1250.0000 mg | INTRAVENOUS | Status: AC
  Administered 2017-08-25: 1250 mg via INTRAVENOUS
  Filled 2017-08-24: qty 1250

## 2017-08-24 MED ORDER — KENNESTONE BLOOD CARDIOPLEGIA VIAL
13.0000 mL | Freq: Once | Status: DC
  Filled 2017-08-24 (×2): qty 13

## 2017-08-24 MED ORDER — SODIUM CHLORIDE 0.9 % IV SOLN
INTRAVENOUS | Status: AC
  Administered 2017-08-25: .7 [IU]/h via INTRAVENOUS
  Filled 2017-08-24: qty 1

## 2017-08-24 MED ORDER — MILRINONE LACTATE IN DEXTROSE 20-5 MG/100ML-% IV SOLN
0.1250 ug/kg/min | INTRAVENOUS | Status: DC
  Filled 2017-08-24: qty 100

## 2017-08-24 MED ORDER — DEXMEDETOMIDINE HCL IN NACL 400 MCG/100ML IV SOLN
0.1000 ug/kg/h | INTRAVENOUS | Status: AC
Start: 2017-08-25 — End: 2017-08-25
  Administered 2017-08-25: .3 ug/kg/h via INTRAVENOUS
  Filled 2017-08-24: qty 100

## 2017-08-24 MED ORDER — METOPROLOL TARTRATE 12.5 MG HALF TABLET
12.5000 mg | ORAL_TABLET | Freq: Once | ORAL | Status: AC
  Administered 2017-08-25: 12.5 mg via ORAL
  Filled 2017-08-24: qty 1

## 2017-08-24 MED ORDER — SODIUM CHLORIDE 0.9 % IV SOLN
30.0000 ug/min | INTRAVENOUS | Status: AC
  Administered 2017-08-25: 15 ug/min via INTRAVENOUS
  Filled 2017-08-24: qty 2

## 2017-08-24 MED ORDER — POTASSIUM CHLORIDE 2 MEQ/ML IV SOLN
80.0000 meq | INTRAVENOUS | Status: DC
  Filled 2017-08-24: qty 40

## 2017-08-24 MED ORDER — NITROGLYCERIN IN D5W 200-5 MCG/ML-% IV SOLN
2.0000 ug/min | INTRAVENOUS | Status: DC
  Filled 2017-08-24: qty 250

## 2017-08-24 MED ORDER — SODIUM CHLORIDE 0.9 % IV SOLN
INTRAVENOUS | Status: AC
  Administered 2017-08-25: 1000 mL
  Filled 2017-08-24: qty 1000

## 2017-08-24 MED ORDER — TRANEXAMIC ACID (OHS) BOLUS VIA INFUSION
15.0000 mg/kg | INTRAVENOUS | Status: AC
  Administered 2017-08-25: 1102.5 mg via INTRAVENOUS
  Filled 2017-08-24: qty 1103

## 2017-08-24 MED ORDER — TRANEXAMIC ACID (OHS) PUMP PRIME SOLUTION
2.0000 mg/kg | INTRAVENOUS | Status: DC
  Filled 2017-08-24: qty 1.47

## 2017-08-24 MED ORDER — DOPAMINE-DEXTROSE 3.2-5 MG/ML-% IV SOLN
0.0000 ug/kg/min | INTRAVENOUS | Status: DC
  Filled 2017-08-24: qty 250

## 2017-08-24 MED ORDER — SODIUM CHLORIDE 0.9 % IV SOLN
1.5000 mg/kg/h | INTRAVENOUS | Status: AC
  Administered 2017-08-25: 1.5 mg/kg/h via INTRAVENOUS
  Filled 2017-08-24: qty 25

## 2017-08-24 NOTE — Patient Instructions (Signed)
   Continue taking all current medications without change through the day before surgery.  Have nothing to eat or drink after midnight tonight.  On the morning of surgery take only Synthroid with a sip of water.  '

## 2017-08-24 NOTE — Progress Notes (Signed)
[address]       [place] [address]             [phone]     CARDIOTHORACIC SURGERY OFFICE NOTE  Referring Provider is [name], MD PCP is Marton Redwood, MD   HPI:  Patient is a 56 year old male with history of aortic stenosis, hypertension, celiac disease, and hyperlipidemia who returns the office today for follow-up of severe symptomatic aortic stenosis with tentative plans to proceed with elective aortic valve replacement and coronary artery bypass grafting tomorrow.  He was originally seen in consultation on August 10, 2017.  Since then he underwent diagnostic cardiac catheterization by Dr. Burt Knack on August 12, 2017.  He was found to have severe two-vessel coronary artery disease in addition to his severe aortic stenosis.  He returns to the office today and reports no new problems or complaints.  He has been taking it easy physically since his heart catheterization and he has not had any symptoms of exertional chest pain or shortness of breath.  His previous problems with sinusitis have resolved.   Current Outpatient Medications  Medication Sig Dispense Refill  . levothyroxine (SYNTHROID, LEVOTHROID) 100 MCG tablet Take 100 mcg by mouth daily before breakfast.    . lisinopril (PRINIVIL,ZESTRIL) 20 MG tablet Take 20 mg by mouth daily.    . simvastatin (ZOCOR) 20 MG tablet Take 20 mg by mouth at bedtime.    . Multiple Vitamins-Minerals (MULTIVITAMIN WITH MINERALS) tablet Take 1 tablet by mouth daily.     No current facility-administered medications for this visit.    Facility-Administered Medications Ordered in Other Visits  Medication Dose Route Frequency Provider Last Rate Last Dose  . [START ON 08/25/2017] cefUROXime (ZINACEF) 1.5 g in dextrose 5 % 50 mL IVPB  1.5 g Intravenous To OR Rexene Alberts, MD      . Derrill Memo ON 08/25/2017] cefUROXime (ZINACEF) 750 mg in dextrose 5 % 50 mL IVPB  750 mg Intravenous To OR Rexene Alberts, MD      .  Derrill Memo ON 08/25/2017] chlorhexidine (PERIDEX) 0.12 % solution 15 mL  15 mL Mouth/Throat Once Rexene Alberts, MD      . Derrill Memo ON 08/25/2017] dexmedetomidine (PRECEDEX) 400 MCG/100ML (4 mcg/mL) infusion  0.1-0.7 mcg/kg/hr Intravenous To OR Rexene Alberts, MD      . Derrill Memo ON 08/25/2017] DOPamine (INTROPIN) 800 mg in dextrose 5 % 250 mL (3.2 mg/mL) infusion  0-10 mcg/kg/min Intravenous To OR Rexene Alberts, MD      . Derrill Memo ON 08/25/2017] EPINEPHrine (ADRENALIN) 4 mg in dextrose 5 % 250 mL (0.016 mg/mL) infusion  0-10 mcg/min Intravenous To OR Rexene Alberts, MD      . Derrill Memo ON 08/25/2017] heparin 2,500 Units, papaverine 30 mg in electrolyte-148 (PLASMALYTE-148) 500 mL irrigation   Irrigation To OR Rexene Alberts, MD      . Derrill Memo ON 08/25/2017] heparin 30,000 units/NS 1000 mL solution for CELLSAVER   Other To OR Rexene Alberts, MD      . Derrill Memo ON 08/25/2017] insulin regular (NOVOLIN R,HUMULIN R) 100 Units in sodium chloride 0.9 % 100 mL (1 Units/mL) infusion   Intravenous To OR Rexene Alberts, MD      . Derrill Memo ON 08/25/2017] [place] Blood Cardioplegia (KBC) lidocaine 2% Syringe (66m)  13 mL Intracoronary Once ORexene Alberts MD      . [Derrill MemoON 08/25/2017] [place] Blood Cardioplegia (KBC) lidocaine 2% Syringe (121m  13 mL Intracoronary Once Rexene Alberts, MD      . Derrill Memo ON 08/25/2017] [place] Blood Cardioplegia (KBC) mannitol 20% Syringe (29m)  32 mL Intracoronary Once ORexene Alberts MD      . [Derrill MemoON 08/25/2017] [place] Blood Cardioplegia (KBC) mannitol 20% Syringe (340m  32 mL Intracoronary Once OwRexene AlbertsMD      . [SDerrill MemoN 08/25/2017] magnesium sulfate (IV Push/IM) injection 40 mEq  40 mEq Other To OR OwRexene AlbertsMD      . [SDerrill MemoN 08/25/2017] metoprolol tartrate (LOPRESSOR) tablet 12.5 mg  12.5 mg Oral Once OwRexene AlbertsMD      . [SDerrill MemoN 08/25/2017] milrinone (PRIMACOR) 20 MG/100 ML (0.2 mg/mL) infusion  0.125 mcg/kg/min Intravenous  To OR OwRexene AlbertsMD      . [SDerrill MemoN 08/25/2017] nitroGLYCERIN 50 mg in dextrose 5 % 250 mL (0.2 mg/mL) infusion  2-200 mcg/min Intravenous To OR OwRexene AlbertsMD      . [SDerrill MemoN 08/25/2017] phenylephrine (NEO-SYNEPHRINE) 20 mg in sodium chloride 0.9 % 250 mL (0.08 mg/mL) infusion  30-200 mcg/min Intravenous To OR OwRexene AlbertsMD      . [SDerrill MemoN 08/25/2017] potassium chloride injection 80 mEq  80 mEq Other To OR OwRexene AlbertsMD      . [SDerrill MemoN 08/25/2017] tranexamic acid (CYKLOKAPRON) 2,500 mg in sodium chloride 0.9 % 250 mL (10 mg/mL) infusion  1.5 mg/kg/hr Intravenous To OR OwRexene AlbertsMD      . [SDerrill MemoN 08/25/2017] tranexamic acid (CYKLOKAPRON) bolus via infusion - over 30 minutes 1,102.5 mg  15 mg/kg Intravenous To OR OwRexene AlbertsMD      . [SDerrill MemoN 08/25/2017] tranexamic acid (CYKLOKAPRON) pump prime solution 147 mg  2 mg/kg Intracatheter To OR OwRexene AlbertsMD      . [SDerrill MemoN 08/25/2017] vancomycin (VANCOCIN) 1,000 mg in sodium chloride 0.9 % 1,000 mL irrigation   Irrigation To OR OwRexene AlbertsMD      . [SDerrill MemoN 08/25/2017] vancomycin (VANCOCIN) 1,250 mg in sodium chloride 0.9 % 250 mL IVPB  1,250 mg Intravenous To OR OwRexene AlbertsMD          Physical Exam:   BP 115/68   Pulse 70   Ht 5' 5"  (1.651 m)   Wt 160 lb (72.6 kg)   SpO2 99%   BMI 26.63 kg/m   General:  Well-appearing  Chest:   Clear to auscultation  CV:   Regular rate and rhythm with prominent systolic murmur  Incisions:  n/a  Abdomen:  Soft nontender  Extremities:  Warm and well perfused  Diagnostic Tests:  RIGHT HEART CATH AND CORONARY ANGIOGRAPHY  Conclusion   1.  Severe LAD stenosis at the level of the first septal perforator 2.  Severe ostial left circumflex stenosis 3.  Mild nonobstructive stenosis of the right coronary artery 4.  Normal right heart hemodynamics 5.  Known severe aortic stenosis  The patient will continue evaluation for CABG/AVR.   Anticipate grafting of the LAD and large first OM branches.  Indications   Severe aortic stenosis [I35.0 (ICD-10-CM)]  Procedural Details/Technique   Technical Details INDICATION: Severe aortic stenosis  PROCEDURAL DETAILS: There was an indwelling IV in a right antecubital vein. Using normal sterile technique, the IV was changed out for a 5 Fr brachial sheath over a 0.018 inch wire. The right wrist was then prepped, draped, and anesthetized with  1% lidocaine. Using the modified Seldinger technique a 5/6 French Slender sheath was placed in the right radial artery. Intra-arterial verapamil was administered through the radial artery sheath. IV heparin was administered after a JR4 catheter was advanced into the central aorta. A Swan-Ganz catheter was used for the right heart catheterization. A Glidewire was used to navigate the Swan-Ganz catheter into the heart. Standard protocol was followed for recording of right heart pressures and sampling of oxygen saturations. Fick cardiac output was calculated. Standard Judkins catheters were used for selective coronary angiography. No attempt was made to cross the aortic valve in this patient with known severe aortic stenosis. There were no immediate procedural complications. The patient was transferred to the post catheterization recovery area for further monitoring.     Estimated blood loss <50 mL.  During this procedure the patient was administered the following to achieve and maintain moderate conscious sedation: Versed 2 mg, Fentanyl 50 mcg, while the patient's heart rate, blood pressure, and oxygen saturation were continuously monitored. The period of conscious sedation was 32 minutes, of which I was present face-to-face 100% of this time.  Coronary Findings   Diagnostic  Dominance: Right  Left Main  Mid LM to Dist LM lesion 30% stenosed  Mid LM to Dist LM lesion is 30% stenosed. The distal left mainstem has mild disease without high-grade obstruction   Left Anterior Descending  Prox LAD lesion 85% stenosed  Prox LAD lesion is 85% stenosed. The LAD at the first septal perforator has severe 85% stenosis just beyond a large first diagonal branch.  Left Circumflex  Ost Cx to Prox Cx lesion 90% stenosed  Ost Cx to Prox Cx lesion is 90% stenosed. There is severe stenosis at the ostium of the left circumflex. There is a large intermediate/high OM branch beyond the area of severe stenosis  Prox Cx lesion 80% stenosed  Prox Cx lesion is 80% stenosed.  First Obtuse Marginal Branch  Vessel is large in size.  Right Coronary Artery  There is mild diffuse disease throughout the vessel. The right coronary artery is dominant. The vessel has mild diffuse calcific disease. There are no high-grade stenoses present. The PDA is a large vessel with mild diffuse nonobstructive disease as well.  Prox RCA to Mid RCA lesion 40% stenosed  Prox RCA to Mid RCA lesion is 40% stenosed.  Intervention   No interventions have been documented.  Left Heart   Aortic Valve There is severe aortic valve stenosis. The aortic valve is calcified. There is restricted aortic valve motion.  Coronary Diagrams   Diagnostic Diagram       Implants     No implant documentation for this case.  MERGE Images   Show images for CARDIAC CATHETERIZATION   Link to Procedure Log   Procedure Log    Hemo Data    Most Recent Value  Fick Cardiac Output 4.62 L/min  Fick Cardiac Output Index 2.55 (L/min)/BSA  RA A Wave 10 mmHg  RA V Wave 8 mmHg  RA Mean 6 mmHg  RV Systolic Pressure 33 mmHg  RV Diastolic Pressure 1 mmHg  RV EDP 7 mmHg  PA Systolic Pressure 29 mmHg  PA Diastolic Pressure 9 mmHg  PA Mean 17 mmHg  PW A Wave 16 mmHg  PW V Wave 17 mmHg  PW Mean 13 mmHg  AO Systolic Pressure 263 mmHg  AO Diastolic Pressure 59 mmHg  AO Mean 78 mmHg  QP/QS 1  TPVR Index 6.66 HRUI  TSVR Index  30.59 HRUI  PVR SVR Ratio 0.06  TPVR/TSVR Ratio 0.22       Impression:  Patient has functionally bicuspid aortic valve with stage D severe symptomatic aortic stenosis and severe multivessel coronary artery disease.  He describes a 3-year history of mild symptoms of exertional shortness of breath and chest tightness consistent with chronic diastolic congestive heart failure and angina pectoris, [place] Heart Association functional class I-II.  I have personally reviewed the patient's recent transthoracic echocardiogram, CTA and diagnostic cardiac catheterization.  Patient appears to have a Sievers type I bicuspid aortic valve.  There is severe thickening, moderate calcification, and restricted leaflet mobility.  Peak velocity across the aortic valve measured 4.3 m/s corresponding to a mean transvalvular gradient estimated 45 mmHg.  Left ventricular systolic function remains normal.  The aortic root appears normal sized.    Catheterization revealed severe multivessel coronary artery disease with high-grade stenosis of the proximal left anterior descending coronary artery and the left circumflex coronary artery.   Plan:  The patient and his wife were again counseled at length regarding treatment alternatives for management of severe aortic stenosis and coronary artery disease including continued medical therapy versus proceeding with aortic valve replacement in the near future.  The natural history of aortic stenosis was reviewed, as was long term prognosis with medical therapy alone. Discussion was held comparing the relative risks of mechanical valve replacement with need for lifelong anticoagulation versus use of a bioprosthetic tissue valve and the associated potential for late structural valve deterioration and failure.  This discussion was placed in the context of the patient's particular circumstances, and as a result the patient specifically requests that their valve be replaced using a bioprosthetic tissue valve .  The potential advantages and  disadvantages associated with use of a rapid-deployment bioprosthetic aortic valve were discussed, including the risks of paravalvular leak, need for permanent pacemaker placement, and expectations for long-term durability.  The patient understands and accepts all potential associated risks of surgery including but not limited to risk of death, stroke, myocardial infarction, congestive heart failure, respiratory failure, renal failure, pneumonia, bleeding requiring blood transfusion and or reexploration, arrhythmia, heart block or bradycardia requiring permanent pacemaker, aortic dissection or other major vascular complication, pleural effusions or other delayed complications related to continued congestive heart failure, and other late complications related to valve replacement including structural valve deterioration and failure, thrombosis, endocarditis, paravalvular leak, or late recurrence of ischemic heart disease.  Expectations for his postoperative convalescence have been discussed.  All questions answered.  For surgery tomorrow morning.   I spent in excess of 15 minutes during the conduct of this office consultation and >50% of this time involved direct face-to-face encounter with the patient for counseling and/or coordination of their care.    Valentina Gu. Roxy Manns, MD 08/24/2017 11:41 AM

## 2017-08-25 ENCOUNTER — Inpatient Hospital Stay (HOSPITAL_COMMUNITY)
Admission: RE | Disposition: A | Discharge status: Home / Self Care | Payer: Self-pay | Source: Ambulatory Visit | Attending: Thoracic Surgery (Cardiothoracic Vascular Surgery)

## 2017-08-25 ENCOUNTER — Inpatient Hospital Stay (HOSPITAL_COMMUNITY): Payer: 59 | Admitting: Certified Registered Nurse Anesthetist

## 2017-08-25 ENCOUNTER — Encounter (HOSPITAL_COMMUNITY): Payer: Self-pay | Admitting: *Deleted

## 2017-08-25 ENCOUNTER — Ambulatory Visit (HOSPITAL_COMMUNITY)
Admission: RE | Admit: 2017-08-25 | Discharge: 2017-08-25 | Disposition: A | Discharge status: Home / Self Care | Payer: 59 | Source: Ambulatory Visit | Attending: Thoracic Surgery (Cardiothoracic Vascular Surgery) | Admitting: Thoracic Surgery (Cardiothoracic Vascular Surgery)

## 2017-08-25 ENCOUNTER — Inpatient Hospital Stay (HOSPITAL_COMMUNITY): Payer: 59

## 2017-08-25 ENCOUNTER — Inpatient Hospital Stay (HOSPITAL_COMMUNITY)
Admission: RE | Admit: 2017-08-25 | Discharge: 2017-08-29 | DRG: 220 | Disposition: A | Discharge status: Home / Self Care | Payer: 59 | Source: Ambulatory Visit | Attending: Thoracic Surgery (Cardiothoracic Vascular Surgery) | Admitting: Thoracic Surgery (Cardiothoracic Vascular Surgery)

## 2017-08-25 DIAGNOSIS — I350 Nonrheumatic aortic (valve) stenosis: Principal | ICD-10-CM

## 2017-08-25 DIAGNOSIS — I447 Left bundle-branch block, unspecified: Secondary | ICD-10-CM | POA: Diagnosis present

## 2017-08-25 DIAGNOSIS — K900 Celiac disease: Secondary | ICD-10-CM | POA: Diagnosis present

## 2017-08-25 DIAGNOSIS — E785 Hyperlipidemia, unspecified: Secondary | ICD-10-CM | POA: Diagnosis present

## 2017-08-25 DIAGNOSIS — Z91018 Allergy to other foods: Secondary | ICD-10-CM

## 2017-08-25 DIAGNOSIS — I25119 Atherosclerotic heart disease of native coronary artery with unspecified angina pectoris: Secondary | ICD-10-CM | POA: Diagnosis present

## 2017-08-25 DIAGNOSIS — Z8249 Family history of ischemic heart disease and other diseases of the circulatory system: Secondary | ICD-10-CM | POA: Diagnosis not present

## 2017-08-25 DIAGNOSIS — Z8371 Family history of colonic polyps: Secondary | ICD-10-CM

## 2017-08-25 DIAGNOSIS — D6959 Other secondary thrombocytopenia: Secondary | ICD-10-CM | POA: Diagnosis not present

## 2017-08-25 DIAGNOSIS — I071 Rheumatic tricuspid insufficiency: Secondary | ICD-10-CM | POA: Diagnosis present

## 2017-08-25 DIAGNOSIS — I2510 Atherosclerotic heart disease of native coronary artery without angina pectoris: Secondary | ICD-10-CM | POA: Diagnosis present

## 2017-08-25 DIAGNOSIS — I10 Essential (primary) hypertension: Secondary | ICD-10-CM | POA: Diagnosis present

## 2017-08-25 DIAGNOSIS — Q231 Congenital insufficiency of aortic valve: Secondary | ICD-10-CM

## 2017-08-25 DIAGNOSIS — Z953 Presence of xenogenic heart valve: Secondary | ICD-10-CM

## 2017-08-25 DIAGNOSIS — K219 Gastro-esophageal reflux disease without esophagitis: Secondary | ICD-10-CM | POA: Diagnosis present

## 2017-08-25 DIAGNOSIS — J9811 Atelectasis: Secondary | ICD-10-CM

## 2017-08-25 DIAGNOSIS — I25810 Atherosclerosis of coronary artery bypass graft(s) without angina pectoris: Secondary | ICD-10-CM | POA: Diagnosis present

## 2017-08-25 DIAGNOSIS — Z79899 Other long term (current) drug therapy: Secondary | ICD-10-CM | POA: Diagnosis not present

## 2017-08-25 DIAGNOSIS — Z79890 Hormone replacement therapy (postmenopausal): Secondary | ICD-10-CM | POA: Diagnosis not present

## 2017-08-25 DIAGNOSIS — Z951 Presence of aortocoronary bypass graft: Secondary | ICD-10-CM

## 2017-08-25 DIAGNOSIS — Q2381 Bicuspid aortic valve: Secondary | ICD-10-CM

## 2017-08-25 DIAGNOSIS — D62 Acute posthemorrhagic anemia: Secondary | ICD-10-CM | POA: Diagnosis not present

## 2017-08-25 HISTORY — DX: Cardiac murmur, unspecified: R01.1

## 2017-08-25 HISTORY — DX: Presence of xenogenic heart valve: Z95.3

## 2017-08-25 HISTORY — DX: Unspecified osteoarthritis, unspecified site: M19.90

## 2017-08-25 HISTORY — PX: AORTIC VALVE REPLACEMENT: SHX41

## 2017-08-25 HISTORY — PX: CORONARY ARTERY BYPASS GRAFT: SHX141

## 2017-08-25 HISTORY — PX: TEE WITHOUT CARDIOVERSION: SHX5443

## 2017-08-25 HISTORY — PX: AORTIC VALVE REPLACEMENT (AVR)/CORONARY ARTERY BYPASS GRAFTING (CABG): SHX5725

## 2017-08-25 LAB — POCT I-STAT 3, ART BLOOD GAS (G3+)
ACID-BASE DEFICIT: 2 mmol/L (ref 0.0–2.0)
ACID-BASE DEFICIT: 1 mmol/L (ref 0.0–2.0)
ACID-BASE EXCESS: 5 mmol/L — AB (ref 0.0–2.0)
Acid-Base Excess: 6 mmol/L — ABNORMAL HIGH (ref 0.0–2.0)
BICARBONATE: 29.1 mmol/L — AB (ref 20.0–28.0)
BICARBONATE: 24 mmol/L (ref 20.0–28.0)
Bicarbonate: 30.6 mmol/L — ABNORMAL HIGH (ref 20.0–28.0)
Bicarbonate: 24.3 mmol/L (ref 20.0–28.0)
Bicarbonate: 25.4 mmol/L (ref 20.0–28.0)
O2 SAT: 100 %
O2 SAT: 100 %
O2 Saturation: 100 %
O2 Saturation: 100 %
O2 Saturation: 99 %
PH ART: 7.481 — AB (ref 7.350–7.450)
PH ART: 7.46 — AB (ref 7.350–7.450)
PH ART: 7.349 — AB (ref 7.350–7.450)
PH ART: 7.365 (ref 7.350–7.450)
PH ART: 7.36 (ref 7.350–7.450)
PO2 ART: 170 mmHg — AB (ref 83.0–108.0)
Patient temperature: 36.9
Patient temperature: 37
TCO2: 32 mmol/L (ref 22–32)
TCO2: 30 mmol/L (ref 22–32)
TCO2: 26 mmol/L (ref 22–32)
TCO2: 27 mmol/L (ref 22–32)
TCO2: 25 mmol/L (ref 22–32)
pCO2 arterial: 40.9 mmHg (ref 32.0–48.0)
pCO2 arterial: 40.9 mmHg (ref 32.0–48.0)
pCO2 arterial: 43.6 mmHg (ref 32.0–48.0)
pCO2 arterial: 44.4 mmHg (ref 32.0–48.0)
pCO2 arterial: 42.4 mmHg (ref 32.0–48.0)
pO2, Arterial: 387 mmHg — ABNORMAL HIGH (ref 83.0–108.0)
pO2, Arterial: 447 mmHg — ABNORMAL HIGH (ref 83.0–108.0)
pO2, Arterial: 193 mmHg — ABNORMAL HIGH (ref 83.0–108.0)
pO2, Arterial: 186 mmHg — ABNORMAL HIGH (ref 83.0–108.0)

## 2017-08-25 LAB — CBC
HCT: 31.6 % — ABNORMAL LOW (ref 39.0–52.0)
HCT: 32.1 % — ABNORMAL LOW (ref 39.0–52.0)
HEMOGLOBIN: 11.1 g/dL — AB (ref 13.0–17.0)
Hemoglobin: 11 g/dL — ABNORMAL LOW (ref 13.0–17.0)
MCH: 30.9 pg (ref 26.0–34.0)
MCH: 30.5 pg (ref 26.0–34.0)
MCHC: 34.8 g/dL (ref 30.0–36.0)
MCHC: 34.6 g/dL (ref 30.0–36.0)
MCV: 88.8 fL (ref 78.0–100.0)
MCV: 88.2 fL (ref 78.0–100.0)
PLATELETS: 128 10*3/uL — AB (ref 150–400)
Platelets: 105 10*3/uL — ABNORMAL LOW (ref 150–400)
RBC: 3.56 MIL/uL — ABNORMAL LOW (ref 4.22–5.81)
RBC: 3.64 MIL/uL — AB (ref 4.22–5.81)
RDW: 12.5 % (ref 11.5–15.5)
RDW: 12.4 % (ref 11.5–15.5)
WBC: 13.2 10*3/uL — AB (ref 4.0–10.5)
WBC: 16 10*3/uL — AB (ref 4.0–10.5)

## 2017-08-25 LAB — POCT I-STAT, CHEM 8
BUN: 25 mg/dL — AB (ref 6–20)
BUN: 22 mg/dL — ABNORMAL HIGH (ref 6–20)
BUN: 23 mg/dL — AB (ref 6–20)
BUN: 22 mg/dL — ABNORMAL HIGH (ref 6–20)
BUN: 21 mg/dL — ABNORMAL HIGH (ref 6–20)
BUN: 18 mg/dL (ref 6–20)
CALCIUM ION: 1.22 mmol/L (ref 1.15–1.40)
CALCIUM ION: 1.21 mmol/L (ref 1.15–1.40)
CHLORIDE: 103 mmol/L (ref 101–111)
CHLORIDE: 104 mmol/L (ref 101–111)
CHLORIDE: 106 mmol/L (ref 101–111)
CREATININE: 0.9 mg/dL (ref 0.61–1.24)
CREATININE: 1 mg/dL (ref 0.61–1.24)
Calcium, Ion: 1.06 mmol/L — ABNORMAL LOW (ref 1.15–1.40)
Calcium, Ion: 1.11 mmol/L — ABNORMAL LOW (ref 1.15–1.40)
Calcium, Ion: 1.08 mmol/L — ABNORMAL LOW (ref 1.15–1.40)
Calcium, Ion: 1.15 mmol/L (ref 1.15–1.40)
Chloride: 102 mmol/L (ref 101–111)
Chloride: 101 mmol/L (ref 101–111)
Chloride: 102 mmol/L (ref 101–111)
Creatinine, Ser: 1 mg/dL (ref 0.61–1.24)
Creatinine, Ser: 0.9 mg/dL (ref 0.61–1.24)
Creatinine, Ser: 0.9 mg/dL (ref 0.61–1.24)
Creatinine, Ser: 1.1 mg/dL (ref 0.61–1.24)
GLUCOSE: 93 mg/dL (ref 65–99)
GLUCOSE: 116 mg/dL — AB (ref 65–99)
GLUCOSE: 139 mg/dL — AB (ref 65–99)
Glucose, Bld: 103 mg/dL — ABNORMAL HIGH (ref 65–99)
Glucose, Bld: 127 mg/dL — ABNORMAL HIGH (ref 65–99)
Glucose, Bld: 122 mg/dL — ABNORMAL HIGH (ref 65–99)
HCT: 36 % — ABNORMAL LOW (ref 39.0–52.0)
HCT: 33 % — ABNORMAL LOW (ref 39.0–52.0)
HCT: 29 % — ABNORMAL LOW (ref 39.0–52.0)
HEMATOCRIT: 28 % — AB (ref 39.0–52.0)
HEMATOCRIT: 29 % — AB (ref 39.0–52.0)
HEMATOCRIT: 31 % — AB (ref 39.0–52.0)
HEMOGLOBIN: 11.2 g/dL — AB (ref 13.0–17.0)
HEMOGLOBIN: 9.5 g/dL — AB (ref 13.0–17.0)
HEMOGLOBIN: 9.9 g/dL — AB (ref 13.0–17.0)
HEMOGLOBIN: 10.5 g/dL — AB (ref 13.0–17.0)
Hemoglobin: 12.2 g/dL — ABNORMAL LOW (ref 13.0–17.0)
Hemoglobin: 9.9 g/dL — ABNORMAL LOW (ref 13.0–17.0)
POTASSIUM: 4.1 mmol/L (ref 3.5–5.1)
POTASSIUM: 4.1 mmol/L (ref 3.5–5.1)
POTASSIUM: 4.7 mmol/L (ref 3.5–5.1)
POTASSIUM: 4.2 mmol/L (ref 3.5–5.1)
POTASSIUM: 4.5 mmol/L (ref 3.5–5.1)
Potassium: 4.1 mmol/L (ref 3.5–5.1)
SODIUM: 139 mmol/L (ref 135–145)
SODIUM: 138 mmol/L (ref 135–145)
SODIUM: 139 mmol/L (ref 135–145)
Sodium: 140 mmol/L (ref 135–145)
Sodium: 141 mmol/L (ref 135–145)
Sodium: 136 mmol/L (ref 135–145)
TCO2: 31 mmol/L (ref 22–32)
TCO2: 29 mmol/L (ref 22–32)
TCO2: 30 mmol/L (ref 22–32)
TCO2: 27 mmol/L (ref 22–32)
TCO2: 28 mmol/L (ref 22–32)
TCO2: 23 mmol/L (ref 22–32)

## 2017-08-25 LAB — POCT I-STAT 4, (NA,K, GLUC, HGB,HCT)
GLUCOSE: 79 mg/dL (ref 65–99)
HCT: 31 % — ABNORMAL LOW (ref 39.0–52.0)
Hemoglobin: 10.5 g/dL — ABNORMAL LOW (ref 13.0–17.0)
POTASSIUM: 3.8 mmol/L (ref 3.5–5.1)
SODIUM: 142 mmol/L (ref 135–145)

## 2017-08-25 LAB — HEMOGLOBIN AND HEMATOCRIT, BLOOD
HCT: 29.9 % — ABNORMAL LOW (ref 39.0–52.0)
HEMOGLOBIN: 10.3 g/dL — AB (ref 13.0–17.0)

## 2017-08-25 LAB — GLUCOSE, CAPILLARY
GLUCOSE-CAPILLARY: 121 mg/dL — AB (ref 65–99)
GLUCOSE-CAPILLARY: 124 mg/dL — AB (ref 65–99)
GLUCOSE-CAPILLARY: 135 mg/dL — AB (ref 65–99)
GLUCOSE-CAPILLARY: 89 mg/dL (ref 65–99)
Glucose-Capillary: 112 mg/dL — ABNORMAL HIGH (ref 65–99)
Glucose-Capillary: 118 mg/dL — ABNORMAL HIGH (ref 65–99)
Glucose-Capillary: 117 mg/dL — ABNORMAL HIGH (ref 65–99)

## 2017-08-25 LAB — CREATININE, SERUM: Creatinine, Ser: 1.2 mg/dL (ref 0.61–1.24)

## 2017-08-25 LAB — PROTIME-INR
INR: 1.46
PROTHROMBIN TIME: 17.6 s — AB (ref 11.4–15.2)

## 2017-08-25 LAB — MAGNESIUM: Magnesium: 3 mg/dL — ABNORMAL HIGH (ref 1.7–2.4)

## 2017-08-25 LAB — APTT: aPTT: 35 seconds (ref 24–36)

## 2017-08-25 LAB — PLATELET COUNT: PLATELETS: 148 10*3/uL — AB (ref 150–400)

## 2017-08-25 SURGERY — CORONARY ARTERY BYPASS GRAFTING (CABG)
Anesthesia: General | Site: Chest

## 2017-08-25 MED ORDER — METOPROLOL TARTRATE 5 MG/5ML IV SOLN: 2.5000 mg | INTRAVENOUS | Status: DC | PRN

## 2017-08-25 MED ORDER — SODIUM CHLORIDE 0.9 % IR SOLN
Status: DC | PRN
  Administered 2017-08-25: 5000 mL

## 2017-08-25 MED ORDER — BISACODYL 10 MG RE SUPP: 10.0000 mg | Freq: Every day | RECTAL | Status: DC

## 2017-08-25 MED ORDER — FAMOTIDINE IN NACL 20-0.9 MG/50ML-% IV SOLN
20.0000 mg | Freq: Two times a day (BID) | INTRAVENOUS | Status: AC
  Administered 2017-08-25 (×2): 20 mg via INTRAVENOUS
  Filled 2017-08-25: qty 50

## 2017-08-25 MED ORDER — MORPHINE SULFATE (PF) 2 MG/ML IV SOLN: 1.0000 mg | INTRAVENOUS | Status: DC | PRN

## 2017-08-25 MED ORDER — OXYCODONE HCL 5 MG PO TABS
5.0000 mg | ORAL_TABLET | ORAL | Status: DC | PRN
  Administered 2017-08-26 – 2017-08-27 (×6): 10 mg via ORAL
  Administered 2017-08-27: 5 mg via ORAL
  Administered 2017-08-27 (×3): 10 mg via ORAL
  Administered 2017-08-28 (×2): 5 mg via ORAL
  Filled 2017-08-25 (×2): qty 2
  Filled 2017-08-25: qty 1
  Filled 2017-08-25: qty 2
  Filled 2017-08-25: qty 1
  Filled 2017-08-25 (×4): qty 2
  Filled 2017-08-25 (×2): qty 1
  Filled 2017-08-25 (×2): qty 2

## 2017-08-25 MED ORDER — PROPOFOL 10 MG/ML IV BOLUS
INTRAVENOUS | Status: AC
  Filled 2017-08-25: qty 20

## 2017-08-25 MED ORDER — ALBUMIN HUMAN 5 % IV SOLN
INTRAVENOUS | Status: DC | PRN
  Administered 2017-08-25 (×2): via INTRAVENOUS

## 2017-08-25 MED ORDER — LACTATED RINGERS IV SOLN
INTRAVENOUS | Status: DC
  Administered 2017-08-26: 1000 mL via INTRAVENOUS

## 2017-08-25 MED ORDER — ARTIFICIAL TEARS OPHTHALMIC OINT
TOPICAL_OINTMENT | OPHTHALMIC | Status: DC | PRN
  Administered 2017-08-25: 1 via OPHTHALMIC

## 2017-08-25 MED ORDER — ALBUMIN HUMAN 5 % IV SOLN
250.0000 mL | INTRAVENOUS | Status: AC | PRN
  Administered 2017-08-25: 250 mL via INTRAVENOUS

## 2017-08-25 MED ORDER — SODIUM CHLORIDE 0.45 % IV SOLN
INTRAVENOUS | Status: DC | PRN
  Administered 2017-08-25: 14:00:00 via INTRAVENOUS

## 2017-08-25 MED ORDER — MORPHINE SULFATE (PF) 4 MG/ML IV SOLN
1.0000 mg | INTRAVENOUS | Status: DC | PRN
  Administered 2017-08-25 – 2017-08-26 (×3): 2 mg via INTRAVENOUS
  Filled 2017-08-25 (×3): qty 1

## 2017-08-25 MED ORDER — METOPROLOL TARTRATE 12.5 MG HALF TABLET: 12.5000 mg | ORAL_TABLET | Freq: Two times a day (BID) | ORAL | Status: DC

## 2017-08-25 MED ORDER — PHENYLEPHRINE HCL 10 MG/ML IJ SOLN
0.0000 ug/min | INTRAMUSCULAR | Status: DC
  Filled 2017-08-25: qty 2

## 2017-08-25 MED ORDER — SODIUM CHLORIDE 0.9 % IV SOLN
INTRAVENOUS | Status: DC
  Filled 2017-08-25: qty 1

## 2017-08-25 MED ORDER — SODIUM CHLORIDE 0.9 % IV SOLN
INTRAVENOUS | Status: DC
  Administered 2017-08-25: 14:00:00 via INTRAVENOUS

## 2017-08-25 MED ORDER — CHLORHEXIDINE GLUCONATE 0.12 % MT SOLN
15.0000 mL | OROMUCOSAL | Status: AC
  Administered 2017-08-25: 15 mL via OROMUCOSAL

## 2017-08-25 MED ORDER — ORAL CARE MOUTH RINSE: 15.0000 mL | Freq: Four times a day (QID) | OROMUCOSAL | Status: DC

## 2017-08-25 MED ORDER — FENTANYL CITRATE (PF) 250 MCG/5ML IJ SOLN
INTRAMUSCULAR | Status: AC
  Filled 2017-08-25: qty 25

## 2017-08-25 MED ORDER — ACETAMINOPHEN 160 MG/5ML PO SOLN: 650.0000 mg | Freq: Once | ORAL | Status: AC

## 2017-08-25 MED ORDER — HEPARIN SODIUM (PORCINE) 1000 UNIT/ML IJ SOLN
INTRAMUSCULAR | Status: DC | PRN
  Administered 2017-08-25: 23000 [IU] via INTRAVENOUS
  Administered 2017-08-25: 3000 [IU] via INTRAVENOUS

## 2017-08-25 MED ORDER — VANCOMYCIN HCL IN DEXTROSE 1-5 GM/200ML-% IV SOLN
1000.0000 mg | Freq: Once | INTRAVENOUS | Status: AC
  Administered 2017-08-25: 1000 mg via INTRAVENOUS
  Filled 2017-08-25: qty 200

## 2017-08-25 MED ORDER — ASPIRIN 81 MG PO CHEW: 324.0000 mg | CHEWABLE_TABLET | Freq: Every day | ORAL | Status: DC

## 2017-08-25 MED ORDER — LACTATED RINGERS IV SOLN: INTRAVENOUS | Status: DC

## 2017-08-25 MED ORDER — GLYCOPYRROLATE 0.2 MG/ML IJ SOLN
INTRAMUSCULAR | Status: DC | PRN
  Administered 2017-08-25: 0.2 mg via INTRAVENOUS

## 2017-08-25 MED ORDER — ACETAMINOPHEN 500 MG PO TABS
1000.0000 mg | ORAL_TABLET | Freq: Four times a day (QID) | ORAL | Status: DC
  Filled 2017-08-25 (×4): qty 2

## 2017-08-25 MED ORDER — ACETAMINOPHEN 650 MG RE SUPP
650.0000 mg | Freq: Once | RECTAL | Status: AC
  Administered 2017-08-25: 650 mg via RECTAL

## 2017-08-25 MED ORDER — INSULIN ASPART 100 UNIT/ML ~~LOC~~ SOLN
0.0000 [IU] | SUBCUTANEOUS | Status: DC
  Administered 2017-08-26 – 2017-08-27 (×4): 2 [IU] via SUBCUTANEOUS

## 2017-08-25 MED ORDER — LACTATED RINGERS IV SOLN
INTRAVENOUS | Status: DC | PRN
  Administered 2017-08-25: 07:00:00 via INTRAVENOUS

## 2017-08-25 MED ORDER — CHLORHEXIDINE GLUCONATE 4 % EX LIQD: 30.0000 mL | CUTANEOUS | Status: DC

## 2017-08-25 MED ORDER — PANTOPRAZOLE SODIUM 40 MG PO TBEC
40.0000 mg | DELAYED_RELEASE_TABLET | Freq: Every day | ORAL | Status: DC
  Administered 2017-08-26 – 2017-08-29 (×3): 40 mg via ORAL
  Filled 2017-08-25 (×4): qty 1

## 2017-08-25 MED ORDER — SODIUM CHLORIDE 0.9% FLUSH: 3.0000 mL | INTRAVENOUS | Status: DC | PRN

## 2017-08-25 MED ORDER — ACETAMINOPHEN 160 MG/5ML PO SOLN: 1000.0000 mg | Freq: Four times a day (QID) | ORAL | Status: DC

## 2017-08-25 MED ORDER — TRAMADOL HCL 50 MG PO TABS
50.0000 mg | ORAL_TABLET | ORAL | Status: DC | PRN
  Administered 2017-08-25 – 2017-08-29 (×5): 100 mg via ORAL
  Filled 2017-08-25 (×5): qty 2

## 2017-08-25 MED ORDER — MAGNESIUM SULFATE 2 GM/50ML IV SOLN
INTRAVENOUS | Status: AC
  Administered 2017-08-25: 4 g via INTRAVENOUS
  Filled 2017-08-25: qty 50

## 2017-08-25 MED ORDER — METOPROLOL TARTRATE 25 MG/10 ML ORAL SUSPENSION: 12.5000 mg | Freq: Two times a day (BID) | ORAL | Status: DC

## 2017-08-25 MED ORDER — INSULIN REGULAR BOLUS VIA INFUSION
0.0000 [IU] | Freq: Three times a day (TID) | INTRAVENOUS | Status: DC
  Filled 2017-08-25: qty 10

## 2017-08-25 MED ORDER — MIDAZOLAM HCL 5 MG/5ML IJ SOLN
INTRAMUSCULAR | Status: DC | PRN
  Administered 2017-08-25: 3 mg via INTRAVENOUS
  Administered 2017-08-25: 2 mg via INTRAVENOUS
  Administered 2017-08-25: 1 mg via INTRAVENOUS
  Administered 2017-08-25: 2 mg via INTRAVENOUS

## 2017-08-25 MED ORDER — CHLORHEXIDINE GLUCONATE 0.12% ORAL RINSE (MEDLINE KIT): 15.0000 mL | Freq: Two times a day (BID) | OROMUCOSAL | Status: DC

## 2017-08-25 MED ORDER — LACTATED RINGERS IV SOLN
500.0000 mL | Freq: Once | INTRAVENOUS | Status: AC | PRN
  Administered 2017-08-25: 500 mL via INTRAVENOUS

## 2017-08-25 MED ORDER — DOCUSATE SODIUM 100 MG PO CAPS
200.0000 mg | ORAL_CAPSULE | Freq: Every day | ORAL | Status: DC
  Administered 2017-08-27 – 2017-08-29 (×3): 200 mg via ORAL
  Filled 2017-08-25 (×3): qty 2

## 2017-08-25 MED ORDER — ORAL CARE MOUTH RINSE
15.0000 mL | Freq: Two times a day (BID) | OROMUCOSAL | Status: DC
  Administered 2017-08-25 – 2017-08-27 (×4): 15 mL via OROMUCOSAL

## 2017-08-25 MED ORDER — LEVOTHYROXINE SODIUM 100 MCG PO TABS
100.0000 ug | ORAL_TABLET | Freq: Every day | ORAL | Status: DC
  Administered 2017-08-26 – 2017-08-29 (×4): 100 ug via ORAL
  Filled 2017-08-25 (×4): qty 1

## 2017-08-25 MED ORDER — SODIUM CHLORIDE 0.9 % IV SOLN
INTRAVENOUS | Status: AC
  Administered 2017-08-25: 14:00:00 via INTRAVENOUS

## 2017-08-25 MED ORDER — POTASSIUM CHLORIDE 10 MEQ/50ML IV SOLN
10.0000 meq | INTRAVENOUS | Status: AC
  Administered 2017-08-25 (×3): 10 meq via INTRAVENOUS

## 2017-08-25 MED ORDER — MORPHINE SULFATE (PF) 4 MG/ML IV SOLN
1.0000 mg | INTRAVENOUS | Status: DC | PRN
  Administered 2017-08-25 (×2): 2 mg via INTRAVENOUS
  Administered 2017-08-25: 4 mg via INTRAVENOUS
  Administered 2017-08-25: 2 mg via INTRAVENOUS
  Filled 2017-08-25 (×4): qty 1

## 2017-08-25 MED ORDER — ONDANSETRON HCL 4 MG/2ML IJ SOLN
4.0000 mg | Freq: Four times a day (QID) | INTRAMUSCULAR | Status: DC | PRN
  Administered 2017-08-25: 4 mg via INTRAVENOUS
  Filled 2017-08-25: qty 2

## 2017-08-25 MED ORDER — SODIUM CHLORIDE 0.9 % IJ SOLN
INTRAMUSCULAR | Status: DC | PRN
  Administered 2017-08-25 (×3): 4 mL via TOPICAL

## 2017-08-25 MED ORDER — ASPIRIN EC 325 MG PO TBEC
325.0000 mg | DELAYED_RELEASE_TABLET | Freq: Every day | ORAL | Status: DC
  Administered 2017-08-26 – 2017-08-29 (×4): 325 mg via ORAL
  Filled 2017-08-25 (×4): qty 1

## 2017-08-25 MED ORDER — MIDAZOLAM HCL 10 MG/2ML IJ SOLN
INTRAMUSCULAR | Status: AC
  Filled 2017-08-25: qty 2

## 2017-08-25 MED ORDER — DIPHENHYDRAMINE HCL 50 MG/ML IJ SOLN
INTRAMUSCULAR | Status: DC | PRN
  Administered 2017-08-25: 25 mg via INTRAVENOUS

## 2017-08-25 MED ORDER — NITROGLYCERIN IN D5W 200-5 MCG/ML-% IV SOLN: 0.0000 ug/min | INTRAVENOUS | Status: DC

## 2017-08-25 MED ORDER — MIDAZOLAM HCL 2 MG/2ML IJ SOLN: 2.0000 mg | INTRAMUSCULAR | Status: DC | PRN

## 2017-08-25 MED ORDER — MAGNESIUM SULFATE 4 GM/100ML IV SOLN
4.0000 g | Freq: Once | INTRAVENOUS | Status: AC
  Administered 2017-08-25: 4 g via INTRAVENOUS
  Filled 2017-08-25: qty 100

## 2017-08-25 MED ORDER — SODIUM CHLORIDE 0.9 % IV SOLN
0.0000 ug/kg/h | INTRAVENOUS | Status: DC
  Administered 2017-08-25: 0.1 ug/kg/h via INTRAVENOUS
  Filled 2017-08-25: qty 2

## 2017-08-25 MED ORDER — DEXTROSE 5 % IV SOLN
1.5000 g | Freq: Two times a day (BID) | INTRAVENOUS | Status: AC
  Administered 2017-08-25 – 2017-08-27 (×4): 1.5 g via INTRAVENOUS
  Filled 2017-08-25 (×5): qty 1.5

## 2017-08-25 MED ORDER — PROPOFOL 10 MG/ML IV BOLUS
INTRAVENOUS | Status: DC | PRN
  Administered 2017-08-25: 30 mg via INTRAVENOUS
  Administered 2017-08-25: 100 mg via INTRAVENOUS

## 2017-08-25 MED ORDER — PROTAMINE SULFATE 10 MG/ML IV SOLN
INTRAVENOUS | Status: DC | PRN
  Administered 2017-08-25: 250 mg via INTRAVENOUS
  Administered 2017-08-25: 10 mg via INTRAVENOUS

## 2017-08-25 MED ORDER — LACTATED RINGERS IV SOLN
INTRAVENOUS | Status: DC | PRN
  Administered 2017-08-25 (×2): via INTRAVENOUS

## 2017-08-25 MED ORDER — ROCURONIUM BROMIDE 10 MG/ML (PF) SYRINGE
PREFILLED_SYRINGE | INTRAVENOUS | Status: DC | PRN
  Administered 2017-08-25 (×2): 50 mg via INTRAVENOUS
  Administered 2017-08-25: 30 mg via INTRAVENOUS
  Administered 2017-08-25 (×2): 50 mg via INTRAVENOUS

## 2017-08-25 MED ORDER — BISACODYL 5 MG PO TBEC
10.0000 mg | DELAYED_RELEASE_TABLET | Freq: Every day | ORAL | Status: DC
  Administered 2017-08-28 – 2017-08-29 (×2): 10 mg via ORAL
  Filled 2017-08-25 (×3): qty 2

## 2017-08-25 MED ORDER — SODIUM CHLORIDE 0.9% FLUSH
3.0000 mL | Freq: Two times a day (BID) | INTRAVENOUS | Status: DC
  Administered 2017-08-26 – 2017-08-29 (×6): 3 mL via INTRAVENOUS

## 2017-08-25 MED ORDER — SODIUM CHLORIDE 0.9 % IV SOLN: 250.0000 mL | INTRAVENOUS | Status: DC

## 2017-08-25 MED ORDER — FENTANYL CITRATE (PF) 250 MCG/5ML IJ SOLN
INTRAMUSCULAR | Status: DC | PRN
  Administered 2017-08-25: 50 ug via INTRAVENOUS
  Administered 2017-08-25: 150 ug via INTRAVENOUS
  Administered 2017-08-25: 100 ug via INTRAVENOUS
  Administered 2017-08-25: 150 ug via INTRAVENOUS
  Administered 2017-08-25: 100 ug via INTRAVENOUS
  Administered 2017-08-25: 150 ug via INTRAVENOUS
  Administered 2017-08-25: 50 ug via INTRAVENOUS
  Administered 2017-08-25: 150 ug via INTRAVENOUS
  Administered 2017-08-25: 25 ug via INTRAVENOUS
  Administered 2017-08-25 (×3): 100 ug via INTRAVENOUS
  Administered 2017-08-25: 25 ug via INTRAVENOUS

## 2017-08-25 SURGICAL SUPPLY — 137 items
ADAPTER CARDIO PERF ANTE/RETRO (ADAPTER) ×3 IMPLANT
ADPR PRFSN 84XANTGRD RTRGD (ADAPTER) ×2
BAG DECANTER FOR FLEXI CONT (MISCELLANEOUS) ×6 IMPLANT
BANDAGE ACE 4X5 VEL STRL LF (GAUZE/BANDAGES/DRESSINGS) ×3 IMPLANT
BANDAGE ACE 6X5 VEL STRL LF (GAUZE/BANDAGES/DRESSINGS) ×3 IMPLANT
BASKET HEART (ORDER IN 25'S) (MISCELLANEOUS) ×1
BASKET HEART (ORDER IN 25S) (MISCELLANEOUS) ×2 IMPLANT
BLADE CLIPPER SURG (BLADE) IMPLANT
BLADE STERNUM SYSTEM 6 (BLADE) ×3 IMPLANT
BLADE SURG 11 STRL SS (BLADE) ×9 IMPLANT
BNDG GAUZE ELAST 4 BULKY (GAUZE/BANDAGES/DRESSINGS) ×3 IMPLANT
CANISTER SUCT 3000ML PPV (MISCELLANEOUS) ×3 IMPLANT
CANNULA EZ GLIDE AORTIC 21FR (CANNULA) ×3 IMPLANT
CANNULA GUNDRY RCSP 15FR (MISCELLANEOUS) ×3 IMPLANT
CANNULA SOFTFLOW AORTIC 7M21FR (CANNULA) ×3 IMPLANT
CATH CPB KIT OWEN (MISCELLANEOUS) ×3 IMPLANT
CATH HEART VENT LEFT (CATHETERS) ×2 IMPLANT
CATH THORACIC 36FR (CATHETERS) ×3 IMPLANT
CATH THORACIC 36FR RT ANG (CATHETERS) ×3 IMPLANT
CLIP RETRACTION 3.0MM CORONARY (MISCELLANEOUS) ×3 IMPLANT
CLIP VESOCCLUDE MED 24/CT (CLIP) ×3 IMPLANT
CLIP VESOCCLUDE SM WIDE 24/CT (CLIP) ×9 IMPLANT
CONT SPEC 4OZ CLIKSEAL STRL BL (MISCELLANEOUS) ×3 IMPLANT
COVER SURGICAL LIGHT HANDLE (MISCELLANEOUS) ×3 IMPLANT
CRADLE DONUT ADULT HEAD (MISCELLANEOUS) ×3 IMPLANT
DRAIN CHANNEL 32F RND 10.7 FF (WOUND CARE) ×6 IMPLANT
DRAPE BILATERAL SPLIT (DRAPES) IMPLANT
DRAPE CARDIOVASCULAR INCISE (DRAPES) ×2
DRAPE CV SPLIT W-CLR ANES SCRN (DRAPES) IMPLANT
DRAPE INCISE IOBAN 66X45 STRL (DRAPES) ×6 IMPLANT
DRAPE SLUSH/WARMER DISC (DRAPES) ×3 IMPLANT
DRAPE SRG 135X102X78XABS (DRAPES) ×2 IMPLANT
DRSG AQUACEL AG ADV 3.5X14 (GAUZE/BANDAGES/DRESSINGS) ×3 IMPLANT
DRSG COVADERM 4X14 (GAUZE/BANDAGES/DRESSINGS) ×3 IMPLANT
ELECT BLADE 4.0 EZ CLEAN MEGAD (MISCELLANEOUS) ×3
ELECT REM PT RETURN 9FT ADLT (ELECTROSURGICAL) ×6
ELECTRODE BLDE 4.0 EZ CLN MEGD (MISCELLANEOUS) ×2 IMPLANT
ELECTRODE REM PT RTRN 9FT ADLT (ELECTROSURGICAL) ×4 IMPLANT
FELT TEFLON 1X6 (MISCELLANEOUS) ×6 IMPLANT
GAUZE SPONGE 4X4 12PLY STRL (GAUZE/BANDAGES/DRESSINGS) ×6 IMPLANT
GLOVE BIO SURGEON STRL SZ 6 (GLOVE) IMPLANT
GLOVE BIO SURGEON STRL SZ 6.5 (GLOVE) ×15 IMPLANT
GLOVE BIO SURGEON STRL SZ7 (GLOVE) IMPLANT
GLOVE BIO SURGEON STRL SZ7.5 (GLOVE) IMPLANT
GLOVE ORTHO TXT STRL SZ7.5 (GLOVE) ×9 IMPLANT
GOWN STRL REUS W/ TWL LRG LVL3 (GOWN DISPOSABLE) ×18 IMPLANT
GOWN STRL REUS W/TWL LRG LVL3 (GOWN DISPOSABLE) ×27
HEMOSTAT POWDER SURGIFOAM 1G (HEMOSTASIS) ×9 IMPLANT
INSERT FOGARTY XLG (MISCELLANEOUS) ×3 IMPLANT
IV NS IRRIG 3000ML ARTHROMATIC (IV SOLUTION) ×3 IMPLANT
KIT BASIN OR (CUSTOM PROCEDURE TRAY) ×3 IMPLANT
KIT DEVICE SUT COR-KNOT MIS 5 (INSTRUMENTS) ×3 IMPLANT
KIT ROOM TURNOVER OR (KITS) ×3 IMPLANT
KIT SUCTION CATH 14FR (SUCTIONS) ×9 IMPLANT
KIT SUT CK MINI COMBO 4X17 (Prosthesis & Implant Heart) ×3 IMPLANT
KIT VASOVIEW HEMOPRO VH 3000 (KITS) ×3 IMPLANT
LEAD PACING MYOCARDI (MISCELLANEOUS) ×3 IMPLANT
LINE VENT (MISCELLANEOUS) ×3 IMPLANT
MARKER GRAFT CORONARY BYPASS (MISCELLANEOUS) ×9 IMPLANT
MASK FACE 3 LYR ANT FOG FR FLM (MASK) ×2 IMPLANT
MASK SURG FACE ANTI FOG ADLT (MASK) ×3
NS IRRIG 1000ML POUR BTL (IV SOLUTION) ×15 IMPLANT
PACK OPEN HEART (CUSTOM PROCEDURE TRAY) ×3 IMPLANT
PAD ARMBOARD 7.5X6 YLW CONV (MISCELLANEOUS) ×6 IMPLANT
PAD ELECT DEFIB RADIOL ZOLL (MISCELLANEOUS) ×3 IMPLANT
PENCIL BUTTON HOLSTER BLD 10FT (ELECTRODE) ×3 IMPLANT
PUNCH AORTIC ROTATE  4.5MM 8IN (MISCELLANEOUS) ×3 IMPLANT
PUNCH AORTIC ROTATE 4.0MM (MISCELLANEOUS) IMPLANT
PUNCH AORTIC ROTATE 4.5MM 8IN (MISCELLANEOUS) IMPLANT
PUNCH AORTIC ROTATE 5MM 8IN (MISCELLANEOUS) IMPLANT
SET CARDIOPLEGIA MPS 5001102 (MISCELLANEOUS) ×3 IMPLANT
SET IRRIG TUBING LAPAROSCOPIC (IRRIGATION / IRRIGATOR) ×3 IMPLANT
SOLUTION ANTI FOG 6CC (MISCELLANEOUS) ×3 IMPLANT
SPONGE LAP 18X18 X RAY DECT (DISPOSABLE) IMPLANT
SPONGE LAP 4X18 X RAY DECT (DISPOSABLE) ×3 IMPLANT
SUT BONE WAX W31G (SUTURE) ×3 IMPLANT
SUT ETHIBON 2 0 V 52N 30 (SUTURE) ×6 IMPLANT
SUT ETHIBON EXCEL 2-0 V-5 (SUTURE) IMPLANT
SUT ETHIBOND 2 0 SH (SUTURE) ×12
SUT ETHIBOND 2 0 SH 36X2 (SUTURE) ×8 IMPLANT
SUT ETHIBOND 2 0 V4 (SUTURE) IMPLANT
SUT ETHIBOND 2 0V4 GREEN (SUTURE) IMPLANT
SUT ETHIBOND 4 0 RB 1 (SUTURE) IMPLANT
SUT ETHIBOND V-5 VALVE (SUTURE) IMPLANT
SUT ETHIBOND X763 2 0 SH 1 (SUTURE) ×24 IMPLANT
SUT MNCRL AB 3-0 PS2 18 (SUTURE) ×6 IMPLANT
SUT MNCRL AB 4-0 PS2 18 (SUTURE) ×3 IMPLANT
SUT PDS AB 1 CTX 36 (SUTURE) ×6 IMPLANT
SUT PROLENE 2 0 SH DA (SUTURE) IMPLANT
SUT PROLENE 3 0 SH DA (SUTURE) ×12 IMPLANT
SUT PROLENE 3 0 SH1 36 (SUTURE) ×3 IMPLANT
SUT PROLENE 4 0 RB 1 (SUTURE) ×21
SUT PROLENE 4 0 SH DA (SUTURE) ×6 IMPLANT
SUT PROLENE 4-0 RB1 .5 CRCL 36 (SUTURE) ×14 IMPLANT
SUT PROLENE 5 0 C 1 36 (SUTURE) ×6 IMPLANT
SUT PROLENE 6 0 C 1 30 (SUTURE) ×9 IMPLANT
SUT PROLENE 7.0 RB 3 (SUTURE) ×12 IMPLANT
SUT PROLENE 8 0 BV175 6 (SUTURE) ×6 IMPLANT
SUT PROLENE BLUE 7 0 (SUTURE) ×3 IMPLANT
SUT PROLENE POLY MONO (SUTURE) IMPLANT
SUT SILK  1 MH (SUTURE) ×5
SUT SILK 1 MH (SUTURE) ×10 IMPLANT
SUT SILK 1 TIES 10X30 (SUTURE) ×3 IMPLANT
SUT SILK 2 0 SH CR/8 (SUTURE) ×6 IMPLANT
SUT SILK 2 0 TIES 10X30 (SUTURE) ×3 IMPLANT
SUT SILK 2 0 TIES 17X18 (SUTURE) ×3
SUT SILK 2-0 18XBRD TIE BLK (SUTURE) ×2 IMPLANT
SUT SILK 3 0 SH CR/8 (SUTURE) ×3 IMPLANT
SUT SILK 4 0 TIES 17X18 (SUTURE) ×6 IMPLANT
SUT STEEL 6MS V (SUTURE) ×3 IMPLANT
SUT STEEL STERNAL CCS#1 18IN (SUTURE) IMPLANT
SUT STEEL SZ 6 DBL 3X14 BALL (SUTURE) ×6 IMPLANT
SUT TEM PAC WIRE 2 0 SH (SUTURE) ×6 IMPLANT
SUT VIC AB 1 CTX 36 (SUTURE)
SUT VIC AB 1 CTX36XBRD ANBCTR (SUTURE) IMPLANT
SUT VIC AB 2-0 CT1 27 (SUTURE) ×3
SUT VIC AB 2-0 CT1 TAPERPNT 27 (SUTURE) ×2 IMPLANT
SUT VIC AB 2-0 CTX 27 (SUTURE) ×6 IMPLANT
SUT VIC AB 3-0 SH 27 (SUTURE)
SUT VIC AB 3-0 SH 27X BRD (SUTURE) IMPLANT
SUT VIC AB 3-0 X1 27 (SUTURE) IMPLANT
SUT VICRYL 4-0 PS2 18IN ABS (SUTURE) IMPLANT
SUTURE E-PAK OPEN HEART (SUTURE) IMPLANT
SYSTEM SAHARA CHEST DRAIN ATS (WOUND CARE) ×3 IMPLANT
TAPE CLOTH SURG 4X10 WHT LF (GAUZE/BANDAGES/DRESSINGS) ×3 IMPLANT
TAPE PAPER 2X10 WHT MICROPORE (GAUZE/BANDAGES/DRESSINGS) ×3 IMPLANT
TOWEL GREEN STERILE (TOWEL DISPOSABLE) ×6 IMPLANT
TOWEL GREEN STERILE FF (TOWEL DISPOSABLE) ×3 IMPLANT
TOWEL OR 17X24 6PK STRL BLUE (TOWEL DISPOSABLE) ×3 IMPLANT
TOWEL OR 17X26 10 PK STRL BLUE (TOWEL DISPOSABLE) ×3 IMPLANT
TRAY FOLEY SILVER 16FR TEMP (SET/KITS/TRAYS/PACK) ×3 IMPLANT
TUBING INSUFFLATION (TUBING) ×3 IMPLANT
UNDERPAD 30X30 (UNDERPADS AND DIAPERS) ×3 IMPLANT
VALVE SYSTEM EDWS INTUITY 25A (Prosthesis & Implant Heart) ×3 IMPLANT
VENT LEFT HEART 12002 (CATHETERS) ×3
WATER STERILE IRR 1000ML POUR (IV SOLUTION) ×6 IMPLANT
YANKAUER SUCT BULB TIP NO VENT (SUCTIONS) ×3 IMPLANT

## 2017-08-25 NOTE — Progress Notes (Signed)
RR increased to 14 per MD.

## 2017-08-25 NOTE — Progress Notes (Signed)
Patient ID: James Wilkins, male   DOB: 1961/03/12, 56 y.o.   MRN: [id number] EVENING ROUNDS NOTE :     [address]       [place] [address]             [phone]                 Day of Surgery Procedure(s) (LRB): CORONARY ARTERY BYPASS GRAFTING (CABG) x two, using left internal mammary artery and left leg greater saphenous vein harvested endoscopically (N/A) AORTIC VALVE REPLACEMENT (AVR) (N/A) TRANSESOPHAGEAL ECHOCARDIOGRAM (TEE) (N/A)  Total Length of Stay:  LOS: 0 days  BP (!) 106/57 (BP Location: Right Arm)   Pulse 80   Temp 98.6 F (37 C)   Resp (!) 27   Ht 5' 5"  (1.651 m)   Wt 159 lb (72.1 kg)   SpO2 100%   BMI 26.46 kg/m   .Intake/Output      11/19 0701 - 11/20 0700 11/20 0701 - 11/21 0700   I.V. (mL/kg)  3334.6 (46.3)   Blood  240   IV Piggyback  1050   Total Intake(mL/kg)  4624.6 (64.1)   Urine (mL/kg/hr)  2675 (3.2)   Blood  400   Chest Tube  290   Total Output  3365   Net  +1259.6          . sodium chloride 20 mL/hr at 08/25/17 1800  . sodium chloride 100 mL/hr at 08/25/17 1800  . [START ON 08/26/2017] sodium chloride    . sodium chloride 20 mL/hr at 08/25/17 1800  . albumin human    . cefUROXime (ZINACEF)  IV    . dexmedetomidine (PRECEDEX) IV infusion Stopped (08/25/17 1547)  . famotidine (PEPCID) IV Stopped (08/25/17 1410)  . insulin (NOVOLIN-R) infusion 0.6 Units/hr (08/25/17 1800)  . lactated ringers    . lactated ringers    . lactated ringers 20 mL/hr at 08/25/17 1800  . nitroGLYCERIN Stopped (08/25/17 1505)  . phenylephrine (NEO-SYNEPHRINE) Adult infusion 10 mcg/min (08/25/17 1800)  . vancomycin       Lab Results  Component Value Date   WBC 13.2 (H) 08/25/2017   HGB 10.5 (L) 08/25/2017   HCT 31.0 (L) 08/25/2017   PLT 105 (L) 08/25/2017   GLUCOSE 79 08/25/2017   ALT 38 08/21/2017   AST 39 08/21/2017   NA 142 08/25/2017   K 3.8 08/25/2017   CL 102 08/25/2017   CREATININE 0.90 08/25/2017   BUN 21 (H) 08/25/2017   CO2 24 08/21/2017   INR 1.46 08/25/2017   HGBA1C 5.5 08/21/2017   Patient extubated neurologically intact Not bleeding Does have some nausea related to pain medication  Grace Isaac MD  Beeper [phone] Office [phone] 08/25/2017 6:45 PM

## 2017-08-25 NOTE — Anesthesia Procedure Notes (Signed)
Arterial Line Insertion Start/End11/20/2018 7:01 AM, 08/25/2017 7:01 AM  Patient location: Pre-op. Preanesthetic checklist: patient identified, IV checked, site marked, risks and benefits discussed, surgical consent, monitors and equipment checked, pre-op evaluation, timeout performed and anesthesia consent Lidocaine 1% used for infiltration Left, radial was placed Catheter size: 20 Fr Hand hygiene performed  and maximum sterile barriers used   Attempts: 1 Procedure performed without using ultrasound guided technique. Following insertion, dressing applied. Post procedure assessment: normal and unchanged

## 2017-08-25 NOTE — Op Note (Signed)
CARDIOTHORACIC SURGERY OPERATIVE NOTE  Date of Procedure:  08/25/2017  Preoperative Diagnosis:   Severe Aortic Stenosis   Severe Multi-vessel Coronary Artery Disease  Postoperative Diagnosis: Same   Procedure:    Aortic Valve Replacement  Edwards Intuity Elite rapid deployment bovine pericardial tissue valve (size 25 mm, model # 8300AB, serial # [id number])   Coronary Artery Bypass Grafting x 2   Left Internal Mammary Artery to Distal Left Anterior Descending Coronary Artery  Saphenous Vein Graft to the First Obtuse Marginal Branch of the Left Circumflex Coronary Artery  Endoscopic Vein Harvest from Left Thigh   Surgeon: Valentina Gu. Roxy Manns, MD  Assistant: Nicholes Rough, PA-C  Anesthesia: Roberts Gaudy, MD  Operative Findings:  James Wilkins type I bicuspid aortic valve with severe aortic stenosis  Normal left ventricular systolic function  Good quality LIMA and SVG conduit for grafting  Good quality target vessels for grafting           BRIEF CLINICAL NOTE AND INDICATIONS FOR SURGERY  Patient is a 56 year old male with history of heart murmur, hypertension, celiac disease, and hyperlipidemia who has been referred for surgical consultation to discuss treatment options for management of recently discovered severe symptomatic aortic stenosis. The patient states that he has known of the presence of a heart murmur since childhood. At some point he was evaluated by a cardiologist in the remote past and advised that he should probably avoid strenuous physical exertion but because of concerns of possible risk of sudden death. He recently was noted to have a prominent systolic murmur on physical exam by his primary care physician, and a transthoracic echocardiogram was performed demonstrating likely bicuspid aortic valve with severe aortic stenosis and preserved left ventricular systolic function. The patient admits to a several year history of exertional shortness of breath and chest  tightness and was referred to Dr. Burt Knack for consultation. He has been scheduled for diagnostic cardiac catheterization and a CT angiogram performed to evaluate the size of the ascending thoracic aorta. The patient was referred for surgical consultation.  Since then he underwent diagnostic cardiac catheterization by Dr. Burt Knack on August 12, 2017. He was found to have severe two-vessel coronary artery disease in addition to his severe aortic stenosis.  The patient has been seen in consultation and counseled at length regarding the indications, risks and potential benefits of surgery.  All questions have been answered, and the patient provides full informed consent for the operation as described.     DETAILS OF THE OPERATIVE PROCEDURE  Preparation:  The patient is brought to the operating room on the above mentioned date and central monitoring was established by the anesthesia team including placement of Swan-Ganz catheter and radial arterial line. The patient is placed in the supine position on the operating table.  Intravenous antibiotics are administered. General endotracheal anesthesia is induced uneventfully. A Foley catheter is placed.  Baseline transesophageal echocardiogram was performed.  Findings were notable for normal left ventricular size and systolic function.  The aortic valve is bicuspid.  There was severe aortic stenosis.  There was mild aortic insufficiency.  There was trace mitral regurgitation.  Right ventricular size and function was normal.  No other significant abnormalities were noted.  The patient's chest, abdomen, both groins, and both lower extremities are prepared and draped in a sterile manner. A time out procedure is performed.   Surgical Approach and Conduit Harvest:  A median sternotomy incision was performed and the left internal mammary artery is dissected from the chest wall and  prepared for bypass grafting. The left internal mammary artery is notably good  quality conduit. Simultaneously, the greater saphenous vein is obtained from the patient's left thigh using endoscopic vein harvest technique. The saphenous vein is notably good quality conduit. After removal of the saphenous vein, the small surgical incisions in the lower extremity are closed with absorbable suture. Following systemic heparinization, the left internal mammary artery was transected distally noted to have excellent flow.   Extracorporeal Cardiopulmonary Bypass and Myocardial Protection:  The ascending aorta and the right atrium are cannulated for cardiopulmonary bypass.  Adequate heparinization is verified.   A retrograde cardioplegia cannula is placed through the right atrium into the coronary sinus.  The operative field was continuously flooded with carbon dioxide gas.  The entire pre-bypass portion of the operation was notable for stable hemodynamics.  Cardiopulmonary bypass was begun and the surface of the heart is inspected.  A left ventricular vent is placed through the right superior pulmonary vein.  A cardioplegia cannula is placed in the ascending aorta.  A temperature probe was placed in the interventricular septum.  The patient is cooled to 32C systemic temperature.  The aortic cross clamp is applied and cardioplegia is delivered initially in an antegrade fashion through the aortic root using modified [place] cold blood cardioplegia (KBC protocol).   The initial cardioplegic arrest is rapid with early diastolic arrest.  Approximately 600 mL of the initial arresting dose is administered antegrade through the aortic root, and the remainder is administered retrograde through the coronary sinus catheter.  Repeat doses of cardioplegia are administered at 90 minutes and every 30 minutes thereafter through the coronary sinus catheter in order to maintain completely flat electrocardiogram.  Myocardial protection was felt to be excellent.   Coronary Artery Bypass Grafting:   The  first obtuse marginal branch of the left circumflex coronary artery was grafted using a reversed saphenous vein graft in an end-to-side fashion.  At the site of distal anastomosis the target vessel was good quality and measured approximately 2.0 mm in diameter.  The distal left anterior coronary artery was grafted with the left internal mammary artery in an end-to-side fashion.  At the site of distal anastomosis the target vessel was good quality and measured approximately 1.7 mm in diameter.   Aortic Valve Replacement:  An oblique transverse aortotomy incision was performed.  The aortic valve was inspected and notable for Sievers type I bicuspid aortic valve with a single raphe joining the left and right cusp of the valve.  There was severe aortic stenosis.  The aortic valve leaflets were excised sharply and the aortic annulus decalcified.  Decalcification was notably straightforward.  The aortic annulus was sized to accept a 25 mm prosthesis.  The aortic root and left ventricle were irrigated with copious cold saline solution.  Aortic valve replacement was performed using an [employer] rapid deployment pericardial tissue valve (size 25 mm, model #8300AB, serial # [id number]).  The valve was rinsed in saline for manufacture recommendations. A total of 4 individual guiding sutures are placed through the aortic annulus, each at the corresponding nadir of each sinus of Valsalva.  The valve was attached to the delivery device and the guiding sutures placed through the valve sewing cuff. The valve is lowered into place. Care is taken to insure that the valve is completely seated in the annulus. Each of the guide sutures are secured using Cor knot clips.  The valve stent is deployed by inflating the deployment balloon to  5.0 atm pressure and holding for 10 seconds. The balloon is deflated the valve holder sutures cut In the deployment system removed.   The valve is carefully inspected to make sure that  it is seated appropriately. Endoscopic visualization through the valve is utilized to inspect the left ventricular outflow tract. Aortic root was filled with saline to make sure the valve is competent. Rewarming is begun.   Procedure Completion:  The aortotomy was closed using a 2-layer closure of running 4-0 Prolene suture.  The single proximal saphenous vein graft anastomosis was performed directly to the ascending aorta prior to removal of the aortic cross-clamp.  One final dose of warm retrograde "reanimation dose" cardioplegia was administered retrograde through the coronary sinus catheter while all air was evacuated through the aortic root.  The aortic cross clamp was removed after a total cross clamp time of 85 minutes.  Epicardial pacing wires are fixed to the right ventricular outflow tract and to the right atrial appendage. The patient is rewarmed to 37C temperature. The aortic and left ventricular vents are removed.  The patient is weaned and disconnected from cardiopulmonary bypass.  The patient's rhythm at separation from bypass was AV paced.  The patient was weaned from cardiopulmonary bypass without any inotropic support. Total cardiopulmonary bypass time for the operation was 123 minutes.  Followup transesophageal echocardiogram performed after separation from bypass revealed a well-seated aortic valve prosthesis that was functioning normally and without any sign of perivalvular leak.  Deep transgastric views are obtained from multiple angles to make certain the valve is well-seated.  Mean transvalvular gradient across the valve is estimated 6 mmHg.  Left ventricular function was unchanged from preoperatively.  The aortic and venous cannula were removed uneventfully. Protamine was administered to reverse the anticoagulation. The mediastinum and pleural space were inspected for hemostasis and irrigated with saline solution. The mediastinum and and the left pleural space were drained using  3 chest tubes placed through separate stab incisions inferiorly.  The soft tissues anterior to the aorta were reapproximated loosely. The sternum is closed with double strength sternal wire. The soft tissues anterior to the sternum were closed in multiple layers and the skin is closed with a running subcuticular skin closure.  The post-bypass portion of the operation was notable for stable rhythm and hemodynamics.  No blood products were administered during the operation.   Disposition:  The patient tolerated the procedure well and is transported to the surgical intensive care in stable condition. There are no intraoperative complications. All sponge instrument and needle counts are verified correct at completion of the operation.    Valentina Gu. Roxy Manns MD 08/25/2017 1:06 PM

## 2017-08-25 NOTE — Procedures (Signed)
Extubation Procedure Note  Patient Details:   Name: James Wilkins DOB: Sep 14, 1961 MRN: [id number]   Airway Documentation:  Airway 8 mm (Active)  Secured at (cm) 22 cm 08/25/2017  3:51 PM  Measured From Lips 08/25/2017  3:51 PM  Secured Location Right 08/25/2017  3:51 PM  Secured By Pink Tape 08/25/2017  3:51 PM  Site Condition Dry 08/25/2017  3:51 PM    Evaluation  O2 sats: stable throughout Complications: No apparent complications Patient did tolerate procedure well. Bilateral Breath Sounds: Clear, Diminished   Yes   Patient extubated per protocol to 4L [place]. Positive cuff leak was noted prior to extubation. Patient achieved NIF of -34 and VC of 1.1L. Patient is alert and oriented to time and place and is able to speak. Vitals are stable and sats are 100%. RT will continue to monitor.   James Wilkins 08/25/2017, 5:20 PM

## 2017-08-25 NOTE — Anesthesia Preprocedure Evaluation (Signed)
Anesthesia Evaluation  Patient identified by MRN, date of birth, ID band Patient awake    Reviewed: Allergy & Precautions, NPO status , Patient's Chart, lab work & pertinent test results  Airway Mallampati: II  TM Distance: >3 FB Neck ROM: Full    Dental  (+) Teeth Intact, Dental Advisory Given   Pulmonary    breath sounds clear to auscultation       Cardiovascular hypertension,  Rhythm:Regular Rate:Normal     Neuro/Psych    GI/Hepatic   Endo/Other    Renal/GU      Musculoskeletal   Abdominal   Peds  Hematology   Anesthesia Other Findings   Reproductive/Obstetrics                             Anesthesia Physical Anesthesia Plan  ASA: III  Anesthesia Plan: General   Post-op Pain Management:    Induction: Intravenous  PONV Risk Score and Plan: Ondansetron and Dexamethasone  Airway Management Planned: Oral ETT  Additional Equipment: 3D TEE, PA Cath, CVP and Arterial line  Intra-op Plan:   Post-operative Plan: Post-operative intubation/ventilation  Informed Consent: I have reviewed the patients History and Physical, chart, labs and discussed the procedure including the risks, benefits and alternatives for the proposed anesthesia with the patient or authorized representative who has indicated his/her understanding and acceptance.   Dental advisory given  Plan Discussed with: CRNA and Anesthesiologist  Anesthesia Plan Comments:         Anesthesia Quick Evaluation

## 2017-08-25 NOTE — Brief Op Note (Signed)
08/25/2017  11:51 AM  PATIENT:  James Wilkins  56 y.o. male  PRE-OPERATIVE DIAGNOSIS:  Aortic Stenosis, coronary artery disease  POST-OPERATIVE DIAGNOSIS:  Aortic Stenosis, coronary artery disease  PROCEDURE:  Procedure(s): CORONARY ARTERY BYPASS GRAFTING (CABG) x , using left internal mammary artery and left leg greater saphenous vein harvested endoscopically (N/A) AORTIC VALVE REPLACEMENT (AVR) (N/A) TRANSESOPHAGEAL ECHOCARDIOGRAM (TEE) (N/A)  LIMA to LAD SVG to OM1  SURGEON:  Surgeon(s) and Role:    Rexene Alberts, MD - Primary  PHYSICIAN ASSISTANT:  Nicholes Rough, PA-C  ANESTHESIA:   general  EBL: TBD  BLOOD ADMINISTERED:none  DRAINS: ROUTINE   LOCAL MEDICATIONS USED:  NONE  SPECIMEN:  Source of Specimen:  AORTIC VALVE LEAFLETS  DISPOSITION OF SPECIMEN:  PATHOLOGY  COUNTS:  YES  TOURNIQUET:  * No tourniquets in log *  DICTATION: .Dragon Dictation  PLAN OF CARE: Admit to inpatient   PATIENT DISPOSITION:  ICU - intubated and hemodynamically stable.   Delay start of Pharmacological VTE agent (>24hrs) due to surgical blood loss or risk of bleeding: yes

## 2017-08-25 NOTE — Care Management Note (Signed)
Case Management Note  Patient Details  Name: James Wilkins MRN: [id number] Date of Birth: 1960-12-29  Subjective/Objective:   From home post op CABG, AVR  , conts on vent, precedex, nitro, and  Neo.             Action/Plan: NCM will follow patient's progression.  Expected Discharge Date:                  Expected Discharge Plan:     In-House Referral:     Discharge planning Services  CM Consult  Post Acute Care Choice:    Choice offered to:     DME Arranged:    DME Agency:     HH Arranged:    HH Agency:     Status of Service:  In process, will continue to follow  If discussed at Long Length of Stay Meetings, dates discussed:    Additional Comments:  Zenon Mayo, RN 08/25/2017, 5:01 PM

## 2017-08-25 NOTE — Progress Notes (Signed)
Rapid weaning protocol

## 2017-08-25 NOTE — OR Nursing (Signed)
12:25 - 1st call to SICU 13:00 -2nd call to SICU

## 2017-08-25 NOTE — Anesthesia Procedure Notes (Signed)
Procedure Name: Intubation Date/Time: 08/25/2017 8:05 AM Performed by: Clearnce Sorrel, CRNA Pre-anesthesia Checklist: Patient identified, Emergency Drugs available, Suction available, Patient being monitored and Timeout performed Patient Re-evaluated:Patient Re-evaluated prior to induction Oxygen Delivery Method: Circle system utilized Preoxygenation: Pre-oxygenation with 100% oxygen Induction Type: IV induction Ventilation: Mask ventilation without difficulty, Oral airway inserted - appropriate to patient size and Two handed mask ventilation required Laryngoscope Size: Mac, 3, Miller and 2 Grade View: Grade III Tube type: Oral Tube size: 8.0 mm Number of attempts: 2 Airway Equipment and Method: Stylet Placement Confirmation: ETT inserted through vocal cords under direct vision,  positive ETCO2 and breath sounds checked- equal and bilateral Secured at: 23 cm Tube secured with: Tape Dental Injury: Teeth and Oropharynx as per pre-operative assessment

## 2017-08-25 NOTE — Anesthesia Procedure Notes (Signed)
Central Venous Catheter Insertion Performed by: Suzette Battiest, MD, anesthesiologist Start/End11/20/2018 6:45 AM, 08/25/2017 6:58 AM Patient location: Pre-op. Preanesthetic checklist: patient identified, IV checked, site marked, risks and benefits discussed, surgical consent, monitors and equipment checked, pre-op evaluation, timeout performed and anesthesia consent Position: Trendelenburg Lidocaine 1% used for infiltration and patient sedated Hand hygiene performed , maximum sterile barriers used  and Seldinger technique used Catheter size: 8.5 Fr Total catheter length 10. Central line and PA cath was placed.Sheath introducer Swan type:thermodilution PA Cath depth:45 Procedure performed using ultrasound guided technique. Ultrasound Notes:anatomy identified, needle tip was noted to be adjacent to the nerve/plexus identified, no ultrasound evidence of intravascular and/or intraneural injection and image(s) printed for medical record Attempts: 1 Following insertion, line sutured and dressing applied. Post procedure assessment: blood return through all ports, free fluid flow and no air  Patient tolerated the procedure well with no immediate complications.

## 2017-08-25 NOTE — Anesthesia Postprocedure Evaluation (Signed)
Anesthesia Post Note  Patient: James Wilkins  Procedure(s) Performed: CORONARY ARTERY BYPASS GRAFTING (CABG) x two, using left internal mammary artery and left leg greater saphenous vein harvested endoscopically (N/A Chest) AORTIC VALVE REPLACEMENT (AVR) (N/A Chest) TRANSESOPHAGEAL ECHOCARDIOGRAM (TEE) (N/A )     Patient location during evaluation: SICU Anesthesia Type: General Level of consciousness: sedated and patient remains intubated per anesthesia plan Vital Signs Assessment: post-procedure vital signs reviewed and stable Respiratory status: patient remains intubated per anesthesia plan and patient on ventilator - see flowsheet for VS Cardiovascular status: blood pressure returned to baseline Anesthetic complications: no    Last Vitals:  Vitals:   08/25/17 1815 08/25/17 1830  BP:    Pulse: 80 80  Resp: (!) 23 (!) 27  Temp: 37 C 37 C  SpO2: 100% 100%    Last Pain:  Vitals:   08/25/17 1744  TempSrc:   PainSc: 4                  Yanira Tolsma COKER

## 2017-08-25 NOTE — Interval H&P Note (Signed)
History and Physical Interval Note:  08/25/2017 5:45 AM  James Wilkins  has presented today for surgery, with the diagnosis of Aortic Stenosis  The various methods of treatment have been discussed with the patient and family. After consideration of risks, benefits and other options for treatment, the patient has consented to  Procedure(s): CORONARY ARTERY BYPASS GRAFTING (CABG) (N/A) AORTIC VALVE REPLACEMENT (AVR) (N/A) TRANSESOPHAGEAL ECHOCARDIOGRAM (TEE) (N/A) as a surgical intervention .  The patient's history has been reviewed, patient examined, no change in status, stable for surgery.  I have reviewed the patient's chart and labs.  Questions were answered to the patient's satisfaction.     Rexene Alberts

## 2017-08-25 NOTE — Anesthesia Procedure Notes (Signed)
Central Venous Catheter Insertion Performed by: Suzette Battiest, MD, anesthesiologist Start/End11/20/2018 6:45 AM, 08/25/2017 6:58 AM Patient location: Pre-op. Preanesthetic checklist: patient identified, IV checked, site marked, risks and benefits discussed, surgical consent, monitors and equipment checked, pre-op evaluation, timeout performed and anesthesia consent Hand hygiene performed  and maximum sterile barriers used  PA cath was placed.Swan type:thermodilution PA Cath depth:45 Procedure performed without using ultrasound guided technique. Attempts: 1 Patient tolerated the procedure well with no immediate complications.

## 2017-08-25 NOTE — Transfer of Care (Signed)
Immediate Anesthesia Transfer of Care Note  Patient: James Wilkins  Procedure(s) Performed: CORONARY ARTERY BYPASS GRAFTING (CABG) x two, using left internal mammary artery and left leg greater saphenous vein harvested endoscopically (N/A Chest) AORTIC VALVE REPLACEMENT (AVR) (N/A Chest) TRANSESOPHAGEAL ECHOCARDIOGRAM (TEE) (N/A )  Patient Location: SICU  Anesthesia Type:General  Level of Consciousness: Patient remains intubated per anesthesia plan  Airway & Oxygen Therapy: Patient remains intubated per anesthesia plan  Post-op Assessment: Report given to RN and Post -op Vital signs reviewed and stable  Post vital signs: Reviewed and stable  Last Vitals:  Vitals:   08/25/17 0647 08/25/17 1335  BP: (!) 145/78   Pulse: 71 80  Resp:  12  Temp:    SpO2:  100%    Last Pain:  Vitals:   08/25/17 0601  TempSrc: Oral         Complications: No apparent anesthesia complications

## 2017-08-25 NOTE — H&P (Signed)
[address]       [place] [address]             [phone]          CARDIOTHORACIC SURGERY HISTORY AND PHYSICAL EXAM  Referring Provider is [name], MD PCP is Marton Redwood, MD      Chief Complaint  Patient presents with  . New Patient (Initial Visit)    aortic valve stenosis, eval for AVR, Cath 08/12/2017, echo    HPI:  Patient is a 56 year old male with history of heart murmur, hypertension, celiac disease, and hyperlipidemia who has been referred for surgical consultation to discuss treatment options for management of recently discovered severe symptomatic aortic stenosis.  The patient states that he has known of the presence of a heart murmur since childhood.  At some point he was evaluated by a cardiologist in the remote past and advised that he should probably avoid strenuous physical exertion but because of concerns of possible risk of sudden death.  He recently was noted to have a prominent systolic murmur on physical exam by his primary care physician, and a transthoracic echocardiogram was performed demonstrating likely bicuspid aortic valve with severe aortic stenosis and preserved left ventricular systolic function.  The patient admits to a several year history of exertional shortness of breath and chest tightness and was referred to Dr. Burt Knack for consultation.  He has been scheduled for diagnostic cardiac catheterization and a CT angiogram performed to evaluate the size of the ascending thoracic aorta.  The patient was referred for surgical consultation.  The patient is married and lives locally in [place] with his wife.  They have no children.  He works full-time as a Aeronautical engineer.  This requires some degree of physical activity and lifting.  The patient has also remained quite active physically all of his adult life.  He enjoys exercising, including lifting weights.  He states that approximately 3 years ago he began to notice a  tendency to get short of breath and chest tightness with more strenuous physical exertion.  He states that if he rakes leaves or petals on a stationary bicycle he will get winded and some tightness across his chest.  He does not get any associated symptoms with low level activity and he can still exercise to some degree.  He has never had any resting shortness of breath, PND, orthopnea, or lower extremity edema.  He does report occasional dizzy spells without any history of syncope.  Patient is a 56 year old male with history of aortic stenosis, hypertension, celiac disease, and hyperlipidemia who returns the office today for follow-up of severe symptomatic aortic stenosis with tentative plans to proceed with elective aortic valve replacement and coronary artery bypass grafting tomorrow.  He was originally seen in consultation on August 10, 2017.  Since then he underwent diagnostic cardiac catheterization by Dr. Burt Knack on August 12, 2017.  He was found to have severe two-vessel coronary artery disease in addition to his severe aortic stenosis.  He returns to the office today and reports no new problems or complaints.  He has been taking it easy physically since his heart catheterization and he has not had any symptoms of exertional chest pain or shortness of breath.  His previous problems with sinusitis have resolved.   Past Medical History:  Diagnosis Date  . Aortic stenosis   . Bicuspid aortic valve   . Celiac disease   . Coronary artery disease involving native coronary  artery of native heart with angina pectoris ([place])   . Dilation of esophagus    tortuous esophagus  . Dyspnea   . Gastritis    non erosive  . GERD (gastroesophageal reflux disease)   . Hemorrhoids   . Hiatal hernia   . HLD (hyperlipidemia)   . Hypertension   . Hypothyroidism     Past Surgical History:  Procedure Laterality Date  . BACK SURGERY  1999  . BLADDER SURGERY    . COLONOSCOPY  2009  .  ESOPHAGOGASTRODUODENOSCOPY  2008  . RIGHT HEART CATH AND CORONARY ANGIOGRAPHY N/A 08/12/2017   Performed by [name], MD at [facility]  . SHOULDER SURGERY Right 2012  . SHOULDER SURGERY Left 2014   x2    Family History  Problem Relation Age of Onset  . Diabetes Father        Type II  . Brain cancer Father   . Heart disease Father   . Heart disease Mother   . Colon polyps Sister   . Cancer Other   . Hypertension Other   . Colon cancer Neg Hx   . Kidney disease Neg Hx   . Esophageal cancer Neg Hx     Social History Social History   Tobacco Use  . Smoking status: Never Smoker  . Smokeless tobacco: Never Used  Substance Use Topics  . Alcohol use: No    Alcohol/week: 0.0 oz  . Drug use: No    Prior to Admission medications   Medication Sig Start Date End Date Taking? Authorizing Provider  levothyroxine (SYNTHROID, LEVOTHROID) 100 MCG tablet Take 100 mcg by mouth daily before breakfast.   Yes [provider]  lisinopril (PRINIVIL,ZESTRIL) 20 MG tablet Take 20 mg by mouth daily.   Yes [provider]  simvastatin (ZOCOR) 20 MG tablet Take 20 mg by mouth at bedtime.   Yes [provider]  Multiple Vitamins-Minerals (MULTIVITAMIN WITH MINERALS) tablet Take 1 tablet by mouth daily.    [provider]    Allergies  Allergen Reactions  . Wheat Bran Diarrhea    Barley,rye    Review of Systems:              General:                      normal appetite, normal energy, no weight gain, no weight loss, no fever             Cardiac:                       + chest pain with exertion, no chest pain at rest, + SOB with exertion, no resting SOB, no PND, no orthopnea, no palpitations, no arrhythmia, no atrial fibrillation, no LE edema, + dizzy spells, no syncope             Respiratory:                 no shortness of breath, no home oxygen, no productive cough, no dry cough, no bronchitis, no wheezing, no hemoptysis, no asthma, no pain  with inspiration or cough, no sleep apnea, no CPAP at night             GI:                               no difficulty swallowing, no reflux, no  frequent heartburn, no hiatal hernia, no abdominal pain, no constipation, no diarrhea, no hematochezia, no hematemesis, no melena             GU:                              no dysuria,  no frequency, no urinary tract infection, no hematuria, no enlarged prostate, no kidney stones, no kidney disease             Vascular:                     no pain suggestive of claudication, no pain in feet, no leg cramps, no varicose veins, no DVT, no non-healing foot ulcer             Neuro:                         no stroke, no TIA's, no seizures, no headaches, no temporary blindness one eye,  no slurred speech, no peripheral neuropathy, no chronic pain, no instability of gait, no memory/cognitive dysfunction             Musculoskeletal:         mild arthritis, no joint swelling, no myalgias, no difficulty walking, normal mobility              Skin:                            no rash, no itching, no skin infections, no pressure sores or ulcerations             Psych:                         no anxiety, no depression, no nervousness, no unusual recent stress             Eyes:                           no blurry vision, no floaters, no recent vision changes, + wears glasses or contacts             ENT:                            no hearing loss, no loose or painful teeth, no dentures, last saw dentist within the past 6 months             Hematologic:               no easy bruising, no abnormal bleeding, no clotting disorder, no frequent epistaxis             Endocrine:                   no diabetes, does not check CBG's at home                                                       Physical Exam:              BP 134/74 (BP Location: Left Arm, Patient Position:  Sitting)   Pulse (!) 56   Ht 5' 5"  (1.651 m)   Wt 160 lb (72.6 kg)   SpO2 99%   BMI 26.63 kg/m               General:                        well-appearing             HEENT:                       Unremarkable              Neck:                           no JVD, no bruits, no adenopathy              Chest:                          clear to auscultation, symmetrical breath sounds, no wheezes, no rhonchi              CV:                              RRR, grade III/VI crescendo/decrescendo murmur heard best at RSB,  no diastolic murmur             Abdomen:                    soft, non-tender, no masses              Extremities:                 warm, well-perfused, pulses palpable, no LE edema             Rectal/GU                   Deferred             Neuro:                         Grossly non-focal and symmetrical throughout             Skin:                            Clean and dry, no rashes, no breakdown   Diagnostic Tests:  Echocardiography  Patient: James Wilkins, James Wilkins MR #: [id number] Study Date: 07/10/2017 Gender: M Age: 67 Height: 165.7 cm Weight: 74.8 kg BSA: 1.87 m^2 Pt. Status: Room:  ATTENDING Lamarr Lulas SONOGRAPHER Wyatt Mage, RDCS PERFORMING [facility], Outpatient  cc:  ------------------------------------------------------------------- LV EF: 55% - 60%  ------------------------------------------------------------------- Indications: Murmur (R01.1).  ------------------------------------------------------------------- History: Risk factors: Dyslipidemia.  ------------------------------------------------------------------- Study Conclusions  - Left ventricle: The cavity size was normal. Systolic function was normal. The estimated ejection fraction was in the range of 55% to 60%. Wall motion was normal; there were no regional wall motion abnormalities. Left ventricular diastolic function parameters were normal. Doppler parameters are  consistent with indeterminate ventricular filling pressure. - Aortic valve: Bicuspid; severely thickened, severely calcified leaflets. There appears to be fusion of the non and left coronary cusps. Valve mobility was  restricted. There was severe stenosis. There was no regurgitation. Peak velocity (S): 434 cm/s. Mean gradient (S): 45 mm Hg. Peak gradient (S): 75 mm Hg. - Aorta: Ascending aortic diameter: 38 mm (S). - Ascending aorta: The ascending aorta was mildly dilated. - Mitral valve: Transvalvular velocity was within the normal range. There was no evidence for stenosis. There was trivial regurgitation. - Right ventricle: The cavity size was mildly dilated. Wall thickness was normal. - Tricuspid valve: There was mild regurgitation. - Pulmonary arteries: Systolic pressure was within the normal range. PA peak pressure: 30 mm Hg (S).  ------------------------------------------------------------------- Study data: No prior study was available for comparison. Study status: Routine. Procedure: The patient reported no pain pre or post test. Transthoracic echocardiography. Image quality was adequate. Study completion: There were no complications. Echocardiography. M-mode, complete 2D, 3D, spectral Doppler, and color Doppler. Birthdate: Patient birthdate: 05-May-1961. Age: Patient is 56 yr old. Sex: Gender: male. BMI: 27.3 kg/m^2. Blood pressure: 137/76 Patient status: Outpatient. Study date: Study date: 07/10/2017. Study time: 03:52 PM. Location: [facility]  -------------------------------------------------------------------  ------------------------------------------------------------------- Left ventricle: The cavity size was normal. Systolic function was normal. The estimated ejection fraction was in the range of 55% to 60%. Wall motion was normal; there were no regional wall motion abnormalities. The transmitral flow pattern was  normal. The deceleration time of the early transmitral flow velocity was normal. The pulmonary vein flow pattern was normal. The tissue Doppler parameters were normal. Left ventricular diastolic function parameters were normal. Doppler parameters are consistent with indeterminate ventricular filling pressure.  ------------------------------------------------------------------- Aortic valve: Bicuspid; severely thickened, severely calcified leaflets. There appears to be fusion of the non and left coronary cusps. Valve mobility was restricted. Doppler: There was severe stenosis. There was no regurgitation. VTI ratio of LVOT to aortic valve: 0.32. Valve area (VTI): 1.1 cm^2. Indexed valve area (VTI): 0.59 cm^2/m^2. Peak velocity ratio of LVOT to aortic valve: 0.31. Valve area (Vmax): 1.08 cm^2. Indexed valve area (Vmax): 0.58 cm^2/m^2. Mean velocity ratio of LVOT to aortic valve: 0.31. Valve area (Vmean): 1.06 cm^2. Indexed valve area (Vmean): 0.57 cm^2/m^2. Mean gradient (S): 45 mm Hg. Peak gradient (S): 75 mm Hg.  ------------------------------------------------------------------- Aorta: Aortic root: The aortic root was normal in size. Ascending aorta: The ascending aorta was mildly dilated.  ------------------------------------------------------------------- Mitral valve: Structurally normal valve. Mobility was not restricted. Doppler: Transvalvular velocity was within the normal range. There was no evidence for stenosis. There was trivial regurgitation. Peak gradient (D): 3 mm Hg.  ------------------------------------------------------------------- Left atrium: The atrium was normal in size.  ------------------------------------------------------------------- Right ventricle: The cavity size was mildly dilated. Wall thickness was normal. Systolic function was normal.  ------------------------------------------------------------------- Pulmonic valve:  Structurally normal valve. Cusp separation was normal. Doppler: Transvalvular velocity was within the normal range. There was no evidence for stenosis. There was no regurgitation.  ------------------------------------------------------------------- Tricuspid valve: Structurally normal valve. Doppler: Transvalvular velocity was within the normal range. There was mild regurgitation.  ------------------------------------------------------------------- Pulmonary artery: The main pulmonary artery was normal-sized. Systolic pressure was within the normal range.  ------------------------------------------------------------------- Right atrium: The atrium was normal in size.  ------------------------------------------------------------------- Pericardium: There was no pericardial effusion.  ------------------------------------------------------------------- Systemic veins: Inferior vena cava: The vessel was normal in size. The respirophasic diameter changes were blunted (< 50%), consistent with elevated central venous pressure.  ------------------------------------------------------------------- Measurements  Left ventricle Value Reference LV ID, ED, PLAX chordal (L) 40.6 mm 43 - 52 LV ID, ES, PLAX chordal 28.6 mm 23 - 38 LV fx shortening, PLAX  chordal 30 % >=29 LV PW thickness, ED 8.66 mm --------- IVS/LV PW ratio, ED 0.98 <=1.3 Stroke volume, 2D 111 ml --------- Stroke volume/bsa, 2D 59 ml/m^2 --------- LV e&', lateral 9.25 cm/s --------- LV E/e&', lateral 8.81 --------- LV e&', medial 8.16 cm/s --------- LV E/e&', medial  9.99 --------- LV e&', average 8.71 cm/s --------- LV E/e&', average 9.36 ---------  Ventricular septum Value Reference IVS thickness, ED 8.47 mm ---------  LVOT Value Reference LVOT ID, S 21 mm --------- LVOT area 3.46 cm^2 --------- LVOT peak velocity, S 135 cm/s --------- LVOT mean velocity, S 97 cm/s --------- LVOT VTI, S 32.1 cm --------- LVOT peak gradient, S 7 mm Hg ---------  Aortic valve Value Reference Aortic valve peak velocity, S 434 cm/s --------- Aortic valve mean velocity, S 317 cm/s --------- Aortic valve VTI, S 101 cm --------- Aortic mean gradient, S 45 mm Hg --------- Aortic peak gradient, S 75 mm Hg --------- VTI ratio, LVOT/AV 0.32 --------- Aortic valve area, VTI 1.1 cm^2 --------- Aortic valve area/bsa, VTI 0.59 cm^2/m^2 --------- Velocity ratio, peak, LVOT/AV 0.31 --------- Aortic valve area, peak velocity 1.08 cm^2 --------- Aortic valve area/bsa, peak 0.58 cm^2/m^2 --------- velocity Velocity ratio, mean, LVOT/AV 0.31 --------- Aortic valve area, mean velocity 1.06 cm^2 --------- Aortic valve area/bsa, mean 0.57 cm^2/m^2 --------- velocity  Aorta Value  Reference Aortic root ID, ED 36 mm --------- Ascending aorta ID, A-P, S 38 mm ---------  Left atrium Value Reference LA ID, A-P, ES 31 mm --------- LA ID/bsa, A-P 1.65 cm/m^2 <=2.2 LA volume, S 45.5 ml --------- LA volume/bsa, S 24.3 ml/m^2 --------- LA volume, ES, 1-p A4C 40.7 ml --------- LA volume/bsa, ES, 1-p A4C 21.7 ml/m^2 --------- LA volume, ES, 1-p A2C 47.3 ml --------- LA volume/bsa, ES, 1-p A2C 25.2 ml/m^2 ---------  Mitral valve Value Reference Mitral E-wave peak velocity 81.5 cm/s --------- Mitral A-wave peak velocity 61.6 cm/s --------- Mitral deceleration time (H) 285 ms 150 - 230 Mitral peak gradient, D 3 mm Hg --------- Mitral E/A ratio, peak 1.3 ---------  Pulmonary arteries Value Reference PA pressure, S, DP 30 mm Hg <=30  Tricuspid valve Value Reference Tricuspid regurg peak velocity 232 cm/s --------- Tricuspid peak RV-RA gradient 22 mm Hg ---------  Systemic veins Value Reference Estimated CVP 8 mm Hg ---------  Right ventricle Value Reference TAPSE 15.6 mm --------- RV pressure, S, DP 30 mm Hg <=30 RV s&', lateral, S 14 cm/s  ---------  Legend: (L) and (H) mark values outside specified reference range.  ------------------------------------------------------------------- Prepared and Electronically Authenticated by  Skeet Latch, MD 2018-10-05T18:12:35    CT ANGIOGRAPHY CHEST WITH CONTRAST  TECHNIQUE: Multidetector CT imaging of the chest was performed using the standard protocol during bolus administration of intravenous contrast. Multiplanar CT image reconstructions and MIPs were obtained to evaluate the vascular anatomy.  CONTRAST: 100 cc Isovue 370  COMPARISON: None.  FINDINGS: Vascular Findings:  There is mild fusiform aneurysmal dilatation of the ascending thoracic aorta with measurements as follows. The thoracic aorta tapers to a normal caliber at the level of the aortic arch. Scattered atherosclerotic plaque within the thoracic aorta, not resulting in a hemodynamically significant stenosis. No definite evidence of thoracic aortic dissection or periaortic stranding on this nongated examination.  Conventional configuration of the aortic arch. The branch vessels of the aortic arch appear widely patent throughout their imaged course.  Normal heart size. Calcifications within the aortic valve leaflets compatible provided history of aortic stenosis. No pericardial effusion.  Although this examination was not tailored  for the evaluation the pulmonary arteries, there are no discrete filling defects within the central pulmonary arterial tree to suggest central pulmonary embolism. Normal caliber of the main pulmonary artery.  -------------------------------------------------------------  Thoracic aortic measurements:  Sinotubular junction  32 mm as measured in greatest oblique coronal dimension.  Proximal ascending aorta  41 mm as measured in greatest oblique axial dimension at the level of the main pulmonary artery (image 43, series 4)  Aortic  arch aorta  24 mm as measured in greatest oblique sagittal dimension.  Proximal descending thoracic aorta  23 mm as measured in greatest oblique axial dimension at the level of the main pulmonary artery.  Distal descending thoracic aorta  22 mm as measured in greatest oblique axial dimension at the level of the diaphragmatic hiatus.  Review of the MIP images confirms the above findings.  -------------------------------------------------------------  Non-Vascular Findings:  Mediastinum/Lymph Nodes: No bulky mediastinal, hilar or axillary lymphadenopathy.  Lungs/Pleura: Minimal tree-in-bud type opacification involving the superior segment of the right lower lobe with associated faint internal calcification (image 49, series 5) compatible with localized post inflammatory change. No discrete focal airspace opacities. No pleural effusion or pneumothorax. The central pulmonary airways appear widely patent.  No discrete worrisome pulmonary nodules.  Upper abdomen: Limited early arterial phase evaluation of the upper abdomen demonstrates a peripherally enhancing nodule within the dome of the right lobe of the liver which measures approximately 1.2 x 1.0 x 1.0 cm (axial image 70, series 4, coronal image 40, series 7, which is incompletely evaluated present examination though appears to demonstrate peripheral nodular interrupted enhancement and is favored to represent a benign hepatic hemangioma. Small hiatal hernia.  Musculoskeletal: No acute or aggressive osseous abnormalities. Stigmata of DISH within the thoracic spine.  IMPRESSION: 1. Mild fusiform aneurysmal dilatation of the ascending thoracic aorta measuring 4 cm in maximal diameter. Recommend annual imaging followup by CTA or MRA. This recommendation follows 2010 ACCF/AHA/AATS/ACR/ASA/SCA/SCAI/SIR/STS/SVM Guidelines for the Diagnosis and Management of Patients with Thoracic Aortic Disease. Circulation.  2010; 121: Y850-Y774. Aortic aneurysm NOS (ICD10-I71.9). 2. Aortic Atherosclerosis (ICD10-I70.0). 3. Calcifications within the aortic valve leaflets compatible provided history of aortic stenosis. 4. Incidentally noted approximately 1.2 cm lesion within the dome of the right lobe of the liver, incompletely characterize though in the absence of a known cirrhosis and/or a primary malignancy is favored to represent a hepatic hemangioma. Further evaluation with right upper quadrant abdominal ultrasound could be performed as indicated.   Electronically Signed By: Sandi Mariscal M.D. On: 08/05/2017 16:59   RIGHT HEART CATH AND CORONARY ANGIOGRAPHY  Conclusion   1. Severe LAD stenosis at the level of the first septal perforator 2. Severe ostial left circumflex stenosis 3. Mild nonobstructive stenosis of the right coronary artery 4. Normal right heart hemodynamics 5. Known severe aortic stenosis  The patient will continue evaluation for CABG/AVR. Anticipate grafting of the LAD and large first OM branches.  Indications   Severe aortic stenosis [I35.0 (ICD-10-CM)]  Procedural Details/Technique   Technical Details INDICATION: Severe aortic stenosis  PROCEDURAL DETAILS: There was an indwelling IV in a right antecubital vein. Using normal sterile technique, the IV was changed out for a 5 Fr brachial sheath over a 0.018 inch wire. The right wrist was then prepped, draped, and anesthetized with 1% lidocaine. Using the modified Seldinger technique a 5/6 French Slender sheath was placed in the right radial artery. Intra-arterial verapamil was administered through the radial artery sheath. IV heparin was administered after a JR4 catheter was advanced into  the central aorta. A Swan-Ganz catheter was used for the right heart catheterization. A Glidewire was used to navigate the Swan-Ganz catheter into the heart. Standard protocol was followed for recording of right heart pressures and  sampling of oxygen saturations. Fick cardiac output was calculated. Standard Judkins catheters were used for selective coronary angiography. No attempt was made to cross the aortic valve in this patient with known severe aortic stenosis. There were no immediate procedural complications. The patient was transferred to the post catheterization recovery area for further monitoring.     Estimated blood loss <50 mL.  During this procedure the patient was administered the following to achieve and maintain moderate conscious sedation: Versed 2 mg, Fentanyl 50 mcg, while the patient's heart rate, blood pressure, and oxygen saturation were continuously monitored. The period of conscious sedation was 32 minutes, of which I was present face-to-face 100% of this time.  Coronary Findings   Diagnostic  Dominance: Right  Left Main  Mid LM to Dist LM lesion 30% stenosed  Mid LM to Dist LM lesion is 30% stenosed. The distal left mainstem has mild disease without high-grade obstruction  Left Anterior Descending  Prox LAD lesion 85% stenosed  Prox LAD lesion is 85% stenosed. The LAD at the first septal perforator has severe 85% stenosis just beyond a large first diagonal branch.  Left Circumflex  Ost Cx to Prox Cx lesion 90% stenosed  Ost Cx to Prox Cx lesion is 90% stenosed. There is severe stenosis at the ostium of the left circumflex. There is a large intermediate/high OM branch beyond the area of severe stenosis  Prox Cx lesion 80% stenosed  Prox Cx lesion is 80% stenosed.  First Obtuse Marginal Branch  Vessel is large in size.  Right Coronary Artery  There is mild diffuse disease throughout the vessel. The right coronary artery is dominant. The vessel has mild diffuse calcific disease. There are no high-grade stenoses present. The PDA is a large vessel with mild diffuse nonobstructive disease as well.  Prox RCA to Mid RCA lesion 40% stenosed  Prox RCA to Mid RCA lesion is 40% stenosed.  Intervention    No interventions have been documented.  Left Heart   Aortic Valve There is severe aortic valve stenosis. The aortic valve is calcified. There is restricted aortic valve motion.  Coronary Diagrams   Diagnostic Diagram       Implants        No implant documentation for this case.  MERGE Images   Show images for CARDIAC CATHETERIZATION   Link to Procedure Log   Procedure Log    Hemo Data    Most Recent Value  Fick Cardiac Output 4.62 L/min  Fick Cardiac Output Index 2.55 (L/min)/BSA  RA A Wave 10 mmHg  RA V Wave 8 mmHg  RA Mean 6 mmHg  RV Systolic Pressure 33 mmHg  RV Diastolic Pressure 1 mmHg  RV EDP 7 mmHg  PA Systolic Pressure 29 mmHg  PA Diastolic Pressure 9 mmHg  PA Mean 17 mmHg  PW A Wave 16 mmHg  PW V Wave 17 mmHg  PW Mean 13 mmHg  AO Systolic Pressure 810 mmHg  AO Diastolic Pressure 59 mmHg  AO Mean 78 mmHg  QP/QS 1  TPVR Index 6.66 HRUI  TSVR Index 30.59 HRUI  PVR SVR Ratio 0.06  TPVR/TSVR Ratio 0.22      Impression:  Patient has functionally bicuspid aortic valve with stage D severe symptomatic aortic stenosis and severe multivessel coronary  artery disease. He describes a 3-year history of mild symptoms of exertional shortness of breath and chest tightness consistent with chronic diastolic congestive heart failure and angina pectoris, [place] Heart Association functional class I-II. I have personally reviewed the patient's recent transthoracic echocardiogram, CTA and diagnostic cardiac catheterization. Patient appears to have a Sievers type I bicuspid aortic valve. There is severe thickening, moderate calcification, and restricted leaflet mobility. Peak velocity across the aortic valve measured 4.3 m/s corresponding to a mean transvalvular gradient estimated 45 mmHg. Left ventricular systolic function remains normal. The aortic root appears normal sized.   Catheterization revealed severe multivessel coronary artery disease with  high-grade stenosis of the proximal left anterior descending coronary artery and the left circumflex coronary artery.   Plan:  The patient and his wife were again counseled at length regarding treatment alternatives for management of severe aortic stenosis and coronary artery disease including continued medical therapy versus proceeding with aortic valve replacement in the near future.  The natural history of aortic stenosis was reviewed, as was long term prognosis with medical therapy alone. Discussion was held comparing the relative risks of mechanical valve replacement with need for lifelong anticoagulation versus use of a bioprosthetic tissue valve and the associated potential for late structural valve deterioration and failure.  This discussion was placed in the context of the patient's particular circumstances, and as a result the patient specifically requests that their valve be replaced using a bioprosthetic tissue valve .  The potential advantages and disadvantages associated with use of a rapid-deployment bioprosthetic aortic valve were discussed, including the risks of paravalvular leak, need for permanent pacemaker placement, and expectations for long-term durability.  The patient understands and accepts all potential associated risks of surgery including but not limited to risk of death, stroke, myocardial infarction, congestive heart failure, respiratory failure, renal failure, pneumonia, bleeding requiring blood transfusion and or reexploration, arrhythmia, heart block or bradycardia requiring permanent pacemaker, aortic dissection or other major vascular complication, pleural effusions or other delayed complications related to continued congestive heart failure, and other late complications related to valve replacement including structural valve deterioration and failure, thrombosis, endocarditis, paravalvular leak, or late recurrence of ischemic heart disease.  Expectations for his  postoperative convalescence have been discussed.  All questions answered.  For surgery tomorrow morning.     Valentina Gu. Roxy Manns, MD 08/24/2017 11:41 AM

## 2017-08-26 ENCOUNTER — Inpatient Hospital Stay (HOSPITAL_COMMUNITY): Payer: 59

## 2017-08-26 LAB — BLOOD GAS, ARTERIAL
ACID-BASE DEFICIT: 0.4 mmol/L (ref 0.0–2.0)
Bicarbonate: 24.4 mmol/L (ref 20.0–28.0)
DRAWN BY: 41977
O2 CONTENT: 3 L/min
O2 SAT: 98.9 %
Patient temperature: 98.6
pCO2 arterial: 44.9 mmHg (ref 32.0–48.0)
pH, Arterial: 7.354 (ref 7.350–7.450)
pO2, Arterial: 149 mmHg — ABNORMAL HIGH (ref 83.0–108.0)

## 2017-08-26 LAB — CBC
HCT: 32.1 % — ABNORMAL LOW (ref 39.0–52.0)
HCT: 34.9 % — ABNORMAL LOW (ref 39.0–52.0)
Hemoglobin: 11 g/dL — ABNORMAL LOW (ref 13.0–17.0)
Hemoglobin: 12.1 g/dL — ABNORMAL LOW (ref 13.0–17.0)
MCH: 30.8 pg (ref 26.0–34.0)
MCH: 31.3 pg (ref 26.0–34.0)
MCHC: 34.3 g/dL (ref 30.0–36.0)
MCHC: 34.7 g/dL (ref 30.0–36.0)
MCV: 89.9 fL (ref 78.0–100.0)
MCV: 90.4 fL (ref 78.0–100.0)
PLATELETS: 126 10*3/uL — AB (ref 150–400)
PLATELETS: 126 10*3/uL — AB (ref 150–400)
RBC: 3.57 MIL/uL — AB (ref 4.22–5.81)
RBC: 3.86 MIL/uL — ABNORMAL LOW (ref 4.22–5.81)
RDW: 13 % (ref 11.5–15.5)
RDW: 12.9 % (ref 11.5–15.5)
WBC: 14.8 10*3/uL — AB (ref 4.0–10.5)
WBC: 16.3 10*3/uL — AB (ref 4.0–10.5)

## 2017-08-26 LAB — POCT I-STAT, CHEM 8
BUN: 22 mg/dL — AB (ref 6–20)
CALCIUM ION: 1.21 mmol/L (ref 1.15–1.40)
CHLORIDE: 98 mmol/L — AB (ref 101–111)
Creatinine, Ser: 1.6 mg/dL — ABNORMAL HIGH (ref 0.61–1.24)
Glucose, Bld: 116 mg/dL — ABNORMAL HIGH (ref 65–99)
HEMATOCRIT: 35 % — AB (ref 39.0–52.0)
Hemoglobin: 11.9 g/dL — ABNORMAL LOW (ref 13.0–17.0)
Potassium: 4.5 mmol/L (ref 3.5–5.1)
SODIUM: 137 mmol/L (ref 135–145)
TCO2: 27 mmol/L (ref 22–32)

## 2017-08-26 LAB — GLUCOSE, CAPILLARY
GLUCOSE-CAPILLARY: 117 mg/dL — AB (ref 65–99)
GLUCOSE-CAPILLARY: 120 mg/dL — AB (ref 65–99)
Glucose-Capillary: 121 mg/dL — ABNORMAL HIGH (ref 65–99)
Glucose-Capillary: 128 mg/dL — ABNORMAL HIGH (ref 65–99)
Glucose-Capillary: 120 mg/dL — ABNORMAL HIGH (ref 65–99)
Glucose-Capillary: 101 mg/dL — ABNORMAL HIGH (ref 65–99)
Glucose-Capillary: 118 mg/dL — ABNORMAL HIGH (ref 65–99)

## 2017-08-26 LAB — MAGNESIUM
MAGNESIUM: 2.4 mg/dL (ref 1.7–2.4)
Magnesium: 2.3 mg/dL (ref 1.7–2.4)

## 2017-08-26 LAB — BASIC METABOLIC PANEL
ANION GAP: 4 — AB (ref 5–15)
BUN: 16 mg/dL (ref 6–20)
CO2: 25 mmol/L (ref 22–32)
Calcium: 8 mg/dL — ABNORMAL LOW (ref 8.9–10.3)
Chloride: 106 mmol/L (ref 101–111)
Creatinine, Ser: 1.21 mg/dL (ref 0.61–1.24)
Glucose, Bld: 124 mg/dL — ABNORMAL HIGH (ref 65–99)
POTASSIUM: 4.6 mmol/L (ref 3.5–5.1)
SODIUM: 135 mmol/L (ref 135–145)

## 2017-08-26 LAB — CREATININE, SERUM
Creatinine, Ser: 1.7 mg/dL — ABNORMAL HIGH (ref 0.61–1.24)
GFR calc non Af Amer: 43 mL/min — ABNORMAL LOW (ref >60)
GFR, EST AFRICAN AMERICAN: 50 mL/min — AB (ref >60)

## 2017-08-26 MED ORDER — KETOROLAC TROMETHAMINE 15 MG/ML IJ SOLN
15.0000 mg | Freq: Four times a day (QID) | INTRAMUSCULAR | Status: DC
  Administered 2017-08-26 (×2): 15 mg via INTRAVENOUS
  Filled 2017-08-26 (×2): qty 1

## 2017-08-26 MED ORDER — FUROSEMIDE 10 MG/ML IJ SOLN
20.0000 mg | Freq: Three times a day (TID) | INTRAMUSCULAR | Status: AC
  Administered 2017-08-26 (×3): 20 mg via INTRAVENOUS
  Filled 2017-08-26 (×3): qty 2

## 2017-08-26 MED ORDER — LABETALOL HCL 5 MG/ML IV SOLN: 10.0000 mg | INTRAVENOUS | Status: DC | PRN

## 2017-08-26 MED ORDER — SIMETHICONE 80 MG PO CHEW
80.0000 mg | CHEWABLE_TABLET | Freq: Four times a day (QID) | ORAL | Status: DC | PRN
  Administered 2017-08-26 – 2017-08-27 (×3): 80 mg via ORAL
  Filled 2017-08-26 (×3): qty 1

## 2017-08-26 MED FILL — Sodium Chloride IV Soln 0.9%: INTRAVENOUS | Qty: 2000 | Status: AC

## 2017-08-26 MED FILL — Sodium Bicarbonate IV Soln 8.4%: INTRAVENOUS | Qty: 50 | Status: AC

## 2017-08-26 MED FILL — Mannitol IV Soln 20%: INTRAVENOUS | Qty: 500 | Status: AC

## 2017-08-26 MED FILL — Electrolyte-R (PH 7.4) Solution: INTRAVENOUS | Qty: 3000 | Status: AC

## 2017-08-26 NOTE — Progress Notes (Signed)
      [address]       [place] [address]             [phone]        CARDIOTHORACIC SURGERY PROGRESS NOTE   R1 Day Post-Op Procedure(s) (LRB): CORONARY ARTERY BYPASS GRAFTING (CABG) x two, using left internal mammary artery and left leg greater saphenous vein harvested endoscopically (N/A) AORTIC VALVE REPLACEMENT (AVR) (N/A) TRANSESOPHAGEAL ECHOCARDIOGRAM (TEE) (N/A)  Subjective: Feels sore in chest.  Difficulty getting comfortable.  Apparently was not on Precedex overnight.  Objective: Vital signs: BP Readings from Last 1 Encounters:  08/26/17 104/70   Pulse Readings from Last 1 Encounters:  08/26/17 84   Resp Readings from Last 1 Encounters:  08/26/17 (!) 31   Temp Readings from Last 1 Encounters:  08/26/17 98.6 F (37 C)    Hemodynamics: PAP: (18-42)/(8-28) 31/19 CO:  [4.1 L/min-6.9 L/min] 4.4 L/min CI:  [1.8 L/min/m2-3.9 L/min/m2] 2.5 L/min/m2  Physical Exam:  Rhythm:   sinus  Breath sounds: clear  Heart sounds:  RRR  Incisions:  Dressing dry, intact  Abdomen:  Soft, non-distended, non-tender  Extremities:  Warm, well-perfused  Chest tubes:  low volume thin serosanguinous output, no air leak    Intake/Output from previous day: 11/20 0701 - 11/21 0700 In: 6604.5 [I.V.:5014.5; Blood:240; IV [id number]:5697] Out: 9480 [Urine:3660; Blood:400; Chest Tube:620] Intake/Output this shift: Total I/O In: 51.5 [I.V.:1.5; IV Piggyback:50] Out: -   Lab Results:  CBC: Recent Labs    08/25/17 1914 08/25/17 1927 08/26/17 0424  WBC 16.0*  --  14.8*  HGB 11.1* 9.9* 11.0*  HCT 32.1* 29.0* 32.1*  PLT 128*  --  126*    BMET:  Recent Labs    08/25/17 1927 08/26/17 0424  NA 139 135  K 4.5 4.6  CL 106 106  CO2  --  25  GLUCOSE 122* 124*  BUN 18 16  CREATININE 1.10 1.21  CALCIUM  --  8.0*     PT/INR:   Recent Labs    08/25/17 1337  LABPROT 17.6*  INR 1.46    CBG (last 3)  Recent Labs    08/25/17 1959 08/25/17 2337  08/26/17 0330  GLUCAP 117* 135* 121*    ABG    Component Value Date/Time   PHART 7.354 08/26/2017 0421   PCO2ART 44.9 08/26/2017 0421   PO2ART 149 (H) 08/26/2017 0421   HCO3 24.4 08/26/2017 0421   TCO2 23 08/25/2017 1927   ACIDBASEDEF 0.4 08/26/2017 0421   O2SAT 98.9 08/26/2017 0421    CXR: Clear.  Mild bibasilar atelectasis L>R  EKG: NSR w/out acute ischemic changes, LBBB (new)   Assessment/Plan: S/P Procedure(s) (LRB): CORONARY ARTERY BYPASS GRAFTING (CABG) x two, using left internal mammary artery and left leg greater saphenous vein harvested endoscopically (N/A) AORTIC VALVE REPLACEMENT (AVR) (N/A) TRANSESOPHAGEAL ECHOCARDIOGRAM (TEE) (N/A)  Overall doing well POD1 Maintaining NSR w/ stable hemodynamics on IV NTG for hypertension Breathing comfortably w/ O2 sats 100% on 2 L/min Post-op chest wall pain, expected Expected post op acute blood loss anemia, mild Expected post op atelectasis, mild Post op thrombocytopenia, mild Expected post op volume excess, weight reportedly 8-11 lbs > preop   Mobilize  Diuresis  Add toradol for pain control D/C tubes later today or tomorrow, depending on output   Rexene Alberts, MD 08/26/2017 8:01 AM

## 2017-08-26 NOTE — Progress Notes (Signed)
Patient ID: James Wilkins, male   DOB: [birth year], 56 y.o.   MRN: [id number]  TCTS Evening Rounds:   Hemodynamically stable  Atrial paced   Urine output good  CT output low  CBC    Component Value Date/Time   WBC 16.3 (H) 08/26/2017 1557   RBC 3.86 (L) 08/26/2017 1557   HGB 11.9 (L) 08/26/2017 1605   HGB 14.2 07/30/2017 1656   HCT 35.0 (L) 08/26/2017 1605   HCT 41.0 07/30/2017 1656   PLT 126 (L) 08/26/2017 1557   PLT 239 07/30/2017 1656   MCV 90.4 08/26/2017 1557   MCV 90 07/30/2017 1656   MCH 31.3 08/26/2017 1557   MCHC 34.7 08/26/2017 1557   RDW 12.9 08/26/2017 1557   RDW 13.4 07/30/2017 1656   LYMPHSABS 2.8 07/30/2017 1656   EOSABS 0.1 07/30/2017 1656   BASOSABS 0.0 07/30/2017 1656     BMET    Component Value Date/Time   NA 137 08/26/2017 1605   NA 144 07/30/2017 1656   K 4.5 08/26/2017 1605   CL 98 (L) 08/26/2017 1605   CO2 25 08/26/2017 0424   GLUCOSE 116 (H) 08/26/2017 1605   BUN 22 (H) 08/26/2017 1605   BUN 17 07/30/2017 1656   CREATININE 1.60 (H) 08/26/2017 1605   CALCIUM 8.0 (L) 08/26/2017 0424   GFRNONAA 43 (L) 08/26/2017 1557   GFRAA 50 (L) 08/26/2017 1557     A/P:  Stable postop course.  Creatinine rise to 1.6-1.7 this pm. Will hold off on further Toradol. Repeat BMET in am.

## 2017-08-27 ENCOUNTER — Encounter (HOSPITAL_COMMUNITY): Payer: Self-pay | Admitting: General Practice

## 2017-08-27 ENCOUNTER — Inpatient Hospital Stay (HOSPITAL_COMMUNITY): Payer: 59

## 2017-08-27 ENCOUNTER — Other Ambulatory Visit: Payer: Self-pay

## 2017-08-27 LAB — GLUCOSE, CAPILLARY
Glucose-Capillary: 120 mg/dL — ABNORMAL HIGH (ref 65–99)
Glucose-Capillary: 116 mg/dL — ABNORMAL HIGH (ref 65–99)
Glucose-Capillary: 107 mg/dL — ABNORMAL HIGH (ref 65–99)

## 2017-08-27 LAB — BASIC METABOLIC PANEL
Anion gap: 8 (ref 5–15)
BUN: 19 mg/dL (ref 6–20)
CO2: 28 mmol/L (ref 22–32)
Calcium: 8.5 mg/dL — ABNORMAL LOW (ref 8.9–10.3)
Chloride: 97 mmol/L — ABNORMAL LOW (ref 101–111)
Creatinine, Ser: 1.49 mg/dL — ABNORMAL HIGH (ref 0.61–1.24)
GFR calc Af Amer: 58 mL/min — ABNORMAL LOW (ref >60)
GFR calc non Af Amer: 50 mL/min — ABNORMAL LOW (ref >60)
GLUCOSE: 118 mg/dL — AB (ref 65–99)
POTASSIUM: 4.1 mmol/L (ref 3.5–5.1)
Sodium: 133 mmol/L — ABNORMAL LOW (ref 135–145)

## 2017-08-27 LAB — CBC
HEMATOCRIT: 32.9 % — AB (ref 39.0–52.0)
Hemoglobin: 11.4 g/dL — ABNORMAL LOW (ref 13.0–17.0)
MCH: 31.4 pg (ref 26.0–34.0)
MCHC: 34.7 g/dL (ref 30.0–36.0)
MCV: 90.6 fL (ref 78.0–100.0)
Platelets: 113 10*3/uL — ABNORMAL LOW (ref 150–400)
RBC: 3.63 MIL/uL — ABNORMAL LOW (ref 4.22–5.81)
RDW: 13 % (ref 11.5–15.5)
WBC: 17 10*3/uL — ABNORMAL HIGH (ref 4.0–10.5)

## 2017-08-27 MED ORDER — POTASSIUM CHLORIDE CRYS ER 20 MEQ PO TBCR
20.0000 meq | EXTENDED_RELEASE_TABLET | Freq: Every day | ORAL | Status: AC
  Administered 2017-08-28 – 2017-08-29 (×2): 20 meq via ORAL
  Filled 2017-08-27 (×2): qty 1

## 2017-08-27 MED ORDER — SODIUM CHLORIDE 0.9 % IV SOLN: 250.0000 mL | INTRAVENOUS | Status: DC | PRN

## 2017-08-27 MED ORDER — SODIUM CHLORIDE 0.9% FLUSH: 3.0000 mL | Freq: Two times a day (BID) | INTRAVENOUS | Status: DC

## 2017-08-27 MED ORDER — MOVING RIGHT ALONG BOOK
Freq: Once | Status: DC
  Filled 2017-08-27: qty 1

## 2017-08-27 MED ORDER — SODIUM CHLORIDE 0.9% FLUSH: 3.0000 mL | INTRAVENOUS | Status: DC | PRN

## 2017-08-27 MED ORDER — METOPROLOL TARTRATE 12.5 MG HALF TABLET
12.5000 mg | ORAL_TABLET | Freq: Two times a day (BID) | ORAL | Status: DC
  Administered 2017-08-27 (×2): 12.5 mg via ORAL
  Filled 2017-08-27 (×2): qty 1

## 2017-08-27 MED ORDER — SIMVASTATIN 20 MG PO TABS
20.0000 mg | ORAL_TABLET | Freq: Every day | ORAL | Status: DC
  Administered 2017-08-28: 20 mg via ORAL
  Filled 2017-08-27: qty 1

## 2017-08-27 MED ORDER — FUROSEMIDE 10 MG/ML IJ SOLN
20.0000 mg | Freq: Once | INTRAMUSCULAR | Status: AC
  Administered 2017-08-27: 20 mg via INTRAVENOUS
  Filled 2017-08-27: qty 2

## 2017-08-27 MED ORDER — FUROSEMIDE 40 MG PO TABS
40.0000 mg | ORAL_TABLET | Freq: Every day | ORAL | Status: AC
  Administered 2017-08-28 – 2017-08-29 (×2): 40 mg via ORAL
  Filled 2017-08-27 (×2): qty 1

## 2017-08-27 NOTE — Plan of Care (Addendum)
Pt. Continues to progress clinically, plans to transfer to stepdown per MD.

## 2017-08-27 NOTE — Progress Notes (Signed)
[address]       [place] [address]             [phone]        CARDIOTHORACIC SURGERY PROGRESS NOTE   R2 Days Post-Op Procedure(s) (LRB): CORONARY ARTERY BYPASS GRAFTING (CABG) x two, using left internal mammary artery and left leg greater saphenous vein harvested endoscopically (N/A) AORTIC VALVE REPLACEMENT (AVR) (N/A) TRANSESOPHAGEAL ECHOCARDIOGRAM (TEE) (N/A)  Subjective: Feels better.  Just ambulated around entire ICU.  Some "gas pains" in stomach  Objective: Vital signs: BP Readings from Last 1 Encounters:  08/27/17 111/75   Pulse Readings from Last 1 Encounters:  08/27/17 100   Resp Readings from Last 1 Encounters:  08/27/17 (!) 36   Temp Readings from Last 1 Encounters:  08/27/17 99 F (37.2 C) (Oral)    Hemodynamics:    Physical Exam:  Rhythm:   Sinus tach  Breath sounds: clear  Heart sounds:  RRR  Incisions:  Dressing dry, intact  Abdomen:  Soft, non-distended, non-tender  Extremities:  Warm, well-perfused  Chest tubes:  low volume thin serosanguinous output, no air leak    Intake/Output from previous day: 11/21 0701 - 11/22 0700 In: 693.5 [P.O.:120; I.V.:473.5; IV Piggyback:100] Out: 2230 [Urine:1880; Chest Tube:350] Intake/Output this shift: Total I/O In: 70 [I.V.:20; IV Piggyback:50] Out: 80 [Urine:60; Chest Tube:20]  Lab Results:  CBC: Recent Labs    08/26/17 1557 08/26/17 1605 08/27/17 0416  WBC 16.3*  --  17.0*  HGB 12.1* 11.9* 11.4*  HCT 34.9* 35.0* 32.9*  PLT 126*  --  113*    BMET:  Recent Labs    08/26/17 0424  08/26/17 1605 08/27/17 0416  NA 135  --  137 133*  K 4.6  --  4.5 4.1  CL 106  --  98* 97*  CO2 25  --   --  28  GLUCOSE 124*  --  116* 118*  BUN 16  --  22* 19  CREATININE 1.21   < > 1.60* 1.49*  CALCIUM 8.0*  --   --  8.5*   < > = values in this interval not displayed.     PT/INR:   Recent Labs    08/25/17 1337  LABPROT 17.6*  INR 1.46    CBG (last 3)  Recent Labs      08/26/17 2324 08/27/17 0403 08/27/17 0803  GLUCAP 120* 120* 116*    ABG    Component Value Date/Time   PHART 7.354 08/26/2017 0421   PCO2ART 44.9 08/26/2017 0421   PO2ART 149 (H) 08/26/2017 0421   HCO3 24.4 08/26/2017 0421   TCO2 27 08/26/2017 1605   ACIDBASEDEF 0.4 08/26/2017 0421   O2SAT 98.9 08/26/2017 0421    CXR: PORTABLE CHEST 1 VIEW  COMPARISON:  08/26/2017  FINDINGS: Swan-Ganz removed. Right introducer sheath left in place. Chest and mediastinal tubes remain in place. There is no pneumothorax. Lungs are very under aerated with bibasilar atelectasis. No sign of vascular congestion or pulmonary edema. Aortic valve replacement hardware is in place.  IMPRESSION: Swan-Ganz removed  Chest tube stable without pneumothorax  Bibasilar atelectasis unchanged.  No pneumothorax or CHF.   Electronically Signed   By: Marybelle Killings M.D.   On: 08/27/2017 08:59  Assessment/Plan: S/P Procedure(s) (LRB): CORONARY ARTERY BYPASS GRAFTING (CABG) x two, using left internal mammary artery and left leg greater saphenous vein harvested endoscopically (N/A) AORTIC VALVE REPLACEMENT (AVR) (N/A) TRANSESOPHAGEAL ECHOCARDIOGRAM (TEE) (N/A)  Doing well  POD2 Maintaining NSR - sinus tach with stable BP Breathing comfortably w/ O2 sats 92-98% on room air Expected post op acute blood loss anemia, mild, stable Expected post op atelectasis, mild, stable Post op thrombocytopenia, mild, stable Expected post op volume excess, diuresing well and weight down 4 lbs but still reportedly 4 lbs > preop LBBB (new)   Mobilize  D/C tubes and Foley  Diuresis  Start low dose beta blocker  Transfer 4E  Rexene Alberts, MD 08/27/2017 9:06 AM

## 2017-08-27 NOTE — Progress Notes (Addendum)
08/27/2017 1000 All CT d/c by Cleotis Nipper RN per orders. One of the anterior mediastinal tube sites sutures not approximated. Dr. Roxy Manns on floor and made aware. Orders received to benzoin and steri strip site. Orders enacted and occlusive dressing applied. Will continue to closely monitor patient.  James Wilkins, [name]

## 2017-08-27 NOTE — Progress Notes (Signed)
Patient arrived to 4E room 10.  Telemetry monitor applied and CCMD notified.  Patient oriented to unit and room to include call light and phone.  Will continue to monitor.

## 2017-08-28 ENCOUNTER — Inpatient Hospital Stay (HOSPITAL_COMMUNITY): Payer: 59

## 2017-08-28 ENCOUNTER — Encounter (HOSPITAL_COMMUNITY): Payer: Self-pay | Admitting: Thoracic Surgery (Cardiothoracic Vascular Surgery)

## 2017-08-28 LAB — CBC
HCT: 32 % — ABNORMAL LOW (ref 39.0–52.0)
HEMOGLOBIN: 11.1 g/dL — AB (ref 13.0–17.0)
MCH: 30.9 pg (ref 26.0–34.0)
MCHC: 34.7 g/dL (ref 30.0–36.0)
MCV: 89.1 fL (ref 78.0–100.0)
Platelets: 93 10*3/uL — ABNORMAL LOW (ref 150–400)
RBC: 3.59 MIL/uL — ABNORMAL LOW (ref 4.22–5.81)
RDW: 12.6 % (ref 11.5–15.5)
WBC: 7 10*3/uL (ref 4.0–10.5)

## 2017-08-28 LAB — BASIC METABOLIC PANEL
Anion gap: 8 (ref 5–15)
BUN: 20 mg/dL (ref 6–20)
CALCIUM: 8.3 mg/dL — AB (ref 8.9–10.3)
CHLORIDE: 93 mmol/L — AB (ref 101–111)
CO2: 31 mmol/L (ref 22–32)
CREATININE: 1.35 mg/dL — AB (ref 0.61–1.24)
GFR calc Af Amer: >60 mL/min (ref >60)
GFR calc non Af Amer: 57 mL/min — ABNORMAL LOW (ref >60)
GLUCOSE: 128 mg/dL — AB (ref 65–99)
Potassium: 4 mmol/L (ref 3.5–5.1)
Sodium: 132 mmol/L — ABNORMAL LOW (ref 135–145)

## 2017-08-28 MED ORDER — METOPROLOL TARTRATE 25 MG PO TABS
25.0000 mg | ORAL_TABLET | Freq: Two times a day (BID) | ORAL | Status: DC
  Administered 2017-08-28 – 2017-08-29 (×3): 25 mg via ORAL
  Filled 2017-08-28 (×3): qty 1

## 2017-08-28 NOTE — Progress Notes (Signed)
CARDIAC REHAB PHASE I   PRE:  Rate/Rhythm: 104 ST BBB  BP:  Supine:   Sitting: 123/78  Standing:    SaO2: 95 RA  MODE:  Ambulation: 790 ft   POST:  Rate/Rhythm: 119 ST BBB  BP:  Supine:   Sitting: 11/72  Standing:    SaO2: 91 RA 0820-0920  Pt assisted X 1 and used walker to ambulate. Gait steady with walker. HR before walk 104 after 119. No c/o with walking. Pt back to recliner after walk with call light in reach and wife present. Completed discharge edcucation with pt and wife. We discussed sternal precaution, use of IS, exercise guidelines, heart healthy diet and Outpt. CRP. Will send referral to [facility]. CRP. Encouraged them to watch Recovery from surgery video today.  Rodney Langton RN 08/28/2017 9:16 AM

## 2017-08-28 NOTE — Progress Notes (Addendum)
      [name]       [place] [address]             [phone]      3 Days Post-Op Procedure(s) (LRB): CORONARY ARTERY BYPASS GRAFTING (CABG) x two, using left internal mammary artery and left leg greater saphenous vein harvested endoscopically (N/A) AORTIC VALVE REPLACEMENT (AVR) (N/A) TRANSESOPHAGEAL ECHOCARDIOGRAM (TEE) (N/A)   Subjective:  Doing okay.  Wants to stop using Oxycodone as he states it makes him feel jittery.  I told him he would have Tramadol available for severe pain if Tylenol does not provide enough relief.  Getting ready to ambulate with cardiac rehab  Objective: Vital signs in last 24 hours: Temp:  [98.2 F (36.8 C)-99.2 F (37.3 C)] 98.9 F (37.2 C) (11/23 0518) Pulse Rate:  [87-108] 101 (11/23 0518) Cardiac Rhythm: Sinus tachycardia;Bundle branch block (11/23 0505) Resp:  [19-37] 29 (11/23 0518) BP: (110-142)/(64-85) 120/73 (11/23 0518) SpO2:  [95 %-100 %] 97 % (11/23 0518) Weight:  [165 lb (74.8 kg)] 165 lb (74.8 kg) (11/23 0518)  Intake/Output from previous day: 11/22 0701 - 11/23 0700 In: 790 [P.O.:720; I.V.:20; IV Piggyback:50] Out: 310 [Urine:290; Chest Tube:20]  General appearance: alert, cooperative and no distress Heart: regular rate and rhythm and tachy Lungs: clear to auscultation bilaterally Abdomen: soft, non-tender; bowel sounds normal; no masses,  no organomegaly Extremities: edema trace Wound: clean and dry  Lab Results: Recent Labs    08/27/17 0416 08/28/17 0225  WBC 17.0* 7.0  HGB 11.4* 11.1*  HCT 32.9* 32.0*  PLT 113* 93*   BMET:  Recent Labs    08/27/17 0416 08/28/17 0225  NA 133* 132*  K 4.1 4.0  CL 97* 93*  CO2 28 31  GLUCOSE 118* 128*  BUN 19 20  CREATININE 1.49* 1.35*  CALCIUM 8.5* 8.3*    PT/INR:  Recent Labs    08/25/17 1337  LABPROT 17.6*  INR 1.46   ABG    Component Value Date/Time   PHART 7.354 08/26/2017 0421   HCO3 24.4 08/26/2017 0421   TCO2 27 08/26/2017 1605   ACIDBASEDEF 0.4 08/26/2017 0421   O2SAT 98.9 08/26/2017 0421   CBG (last 3)  Recent Labs    08/27/17 0403 08/27/17 0803 08/27/17 1152  GLUCAP 120* 116* 107*    Assessment/Plan: S/P Procedure(s) (LRB): CORONARY ARTERY BYPASS GRAFTING (CABG) x two, using left internal mammary artery and left leg greater saphenous vein harvested endoscopically (N/A) AORTIC VALVE REPLACEMENT (AVR) (N/A) TRANSESOPHAGEAL ECHOCARDIOGRAM (TEE) (N/A)  1. CV- Sinus Tach, mild HTN- will increase Lopressor today.. If remains hypertensive can possibly restart ACE tomorrow 2. Pulm- no acute issues, continue IS 3. Renal- creatinine trending down at 1.35 today, weight is improving, continue Lasix, potassium WNL 4. Expected post operative blood loss anemia- mild Hgb at 11.1 5. Thrombocytopenia- plt count down to 93 from 113 this morning, will monitor, may need to stop Lovenox 6. Dispo- patient stable, increase Lopressor for tachycardia, may be able to restart ACE tomorrow if BP allows, drop in platelets today will repeat in AM, may need to stop Lovenox, d/c EPW... Possibly ready for d/c in next 24-48 hours   LOS: 3 days    James Wilkins 08/28/2017  I have seen and examined the patient and agree with the assessment and plan as outlined.  James Alberts, MD 08/28/2017 1:55 PM

## 2017-08-28 NOTE — Progress Notes (Signed)
Anesthesiology Follow-up:  Awake and alert, neuro intact, in good spirits, sitting in chair, ambulating with assistance.  VS: T-37.2 BP- 120/73 HR- 101 RR- 23 O2 Sat 97% on RA  K-4.0 Na- 132 BUN/Cr. 20/1.35 H/H- 11.1/32 Platelets-93,000  Extubated 3 hours post-op. Had increase in creatinine to 1.70 following surgery, baseline is 1.20, now trending down.  56 year old male 3 days S/P CABG X2 and AVR using [employer] Intuity Elite rapid deployment valve. Stable post-op course, renal function improving as noted.  Roberts Gaudy

## 2017-08-28 NOTE — Discharge Summary (Signed)
Physician Discharge Summary  Patient ID: James Wilkins MRN: [id number] DOB/AGE: 1961/05/23 56 y.o.  Admit date: 08/25/2017 Discharge date: 08/29/2017  Admission Diagnoses:  Patient Active Problem List   Diagnosis Date Noted  . Coronary artery disease involving native coronary artery of native heart with angina pectoris ([place])   . Severe aortic stenosis   . Bicuspid aortic valve   . Celiac disease 08/18/2014  . Bleeding hemorrhoids 08/18/2014  . HYPOTHYROIDISM 10/30/2007  . HYPERLIPIDEMIA 10/30/2007  . GERD 10/30/2007   Discharge Diagnoses:   Patient Active Problem List   Diagnosis Date Noted  . S/P aortic valve replacement with bioprosthetic valve + CABG x2 08/25/2017  . Coronary artery disease involving native coronary artery of native heart with angina pectoris ([place])   . Severe aortic stenosis   . Bicuspid aortic valve   . Celiac disease 08/18/2014  . Bleeding hemorrhoids 08/18/2014  . HYPOTHYROIDISM 10/30/2007  . HYPERLIPIDEMIA 10/30/2007  . GERD 10/30/2007   Discharged Condition: good  History of Present Illness:  James Wilkins is a 56 yo white male with history of heart murmur, hypertension, celiac disease, and hyperlipidemia.  The patient states that he has known of the presence of a heart murmur since childhood. At some point he was evaluated by a cardiologist in the remote past and advised that he should probably avoid strenuous physical exertion due to concerns of possible risk of sudden death. He recently was noted to have a prominent systolic murmur on physical exam by his primary care physician.  A transthoracic echocardiogram was performed demonstrating a likely bicuspid aortic valve with severe aortic stenosis and preserved left ventricular systolic function. The patient admits to a several year history of exertional shortness of breath and chest tightness and was referred to Dr. Burt Knack for consultation.  It was felt patient would require surgical replacement of his  Aortic valve.  He would require cardiac catheterization prior to proceeding.  This was performed on 08/12/2017 and showed 2 vessel CAD.  He was referred to Dr. Roxy Manns at [facility] for surgical evaluation.  He was in agreement the patient would require surgical intervention.  CTA of the chest was performed and did not show evidence of aortic aneurysm or enlargement.  Dental clearance was also obtained prior to surgery.  The risks and benefits of the procedure were explained to the patient and he was agreeable to proceed.  Hospital Course:   James Wilkins presented to [facility] on 08/25/2017.  He was taken to the operating room and underwent CABG x 2 utilizing LIMA to LAD, and SVG to OM 1, Aortic Valve Replacement with a 25 Edwards Intuity Elite rapid debloyment bovine tissue valve, and endoscopic harvest of greater saphenous vein from his left thigh.  He tolerated the procedure without difficulty and was taken to the SICU in stable condition.  The patient was extubated the evening of surgery.  During his stay in the SICU the patient was weaned off NTG as tolerated.  His chest tubes and arterial lines were removed on POD #2.  He was having issues with pain control.  He was treated with IV Toradol, but unfortunately developed a rise in his creatinine level.  He was started on low dose beta blocker.  He was maintaining NSR with a new LBBB.  He was ambulating with minimal assistance and was transferred to the telemetry unit in stable condition on 08/27/2017.  The patient continues to make progress.  His creatinine level trended down with most recent level  of 1.35.  He continued to maintain NSR but was tachycardic and his beta blocker was titrated for this.  He will be started on an ACE inhibitor prior to discharge if his BP allows.  He was diuresed for hypervolemia.  His pacing wires were removed without difficulty.  He continued to ambulate independently.  He was tolerating a heart healthy diet.  He is medically stable  for discharge home today.         Significant Diagnostic Studies: cardiac graphics:   Echocardiogram:   Study Conclusions  - Left ventricle: The cavity size was normal. Systolic function was   normal. The estimated ejection fraction was in the range of 55%   to 60%. Wall motion was normal; there were no regional wall   motion abnormalities. Left ventricular diastolic function   parameters were normal. Doppler parameters are consistent with   indeterminate ventricular filling pressure. - Aortic valve: Bicuspid; severely thickened, severely calcified   leaflets. There appears to be fusion of the non and left coronary   cusps. Valve mobility was restricted. There was severe stenosis.   There was no regurgitation. Peak velocity (S): 434 cm/s. Mean   gradient (S): 45 mm Hg. Peak gradient (S): 75 mm Hg. - Aorta: Ascending aortic diameter: 38 mm (S). - Ascending aorta: The ascending aorta was mildly dilated. - Mitral valve: Transvalvular velocity was within the normal range.   There was no evidence for stenosis. There was trivial   regurgitation. - Right ventricle: The cavity size was mildly dilated. Wall   thickness was normal. - Tricuspid valve: There was mild regurgitation. - Pulmonary arteries: Systolic pressure was within the normal   range. PA peak pressure: 30 mm Hg (S).  Angiography:   1.  Severe LAD stenosis at the level of the first septal perforator 2.  Severe ostial left circumflex stenosis 3.  Mild nonobstructive stenosis of the right coronary artery 4.  Normal right heart hemodynamics 5.  Known severe aortic stenosis  Treatments: surgery:   Procedure:        Aortic Valve Replacement             Edwards Intuity Elite rapid deployment bovine pericardial tissue valve (size 25 mm, model # 8300AB, serial # [id number])   Coronary Artery Bypass Grafting x 2              Left Internal Mammary Artery to Distal Left Anterior Descending Coronary Artery              Saphenous Vein Graft to the First Obtuse Marginal Branch of the Left Circumflex Coronary Artery             Endoscopic Vein Harvest from Left Thigh              Disposition: 01-Home or Self Care   Discharge Medications:  The patient has been discharged on:   1.Beta Blocker:  Yes [ x ]                              No   [   ]                              If No, reason:  2.Ace Inhibitor/ARB: Yes [   ]  No  [  n  ]                                     If No, reason:low BP  3.Statin:   Yes [ x  ]                  No  [   ]                  If No, reason:  4.Ecasa:  Yes  [x   ]                  No   [   ]                  If No, reason:  Discharge Instructions    Discharge patient   Complete by:  As directed    Discharge disposition:  01-Home or Self Care   Discharge patient date:  08/29/2017     Allergies as of 08/29/2017      Reactions   Wheat Bran Diarrhea   Barley,rye      Medication List    STOP taking these medications   lisinopril 20 MG tablet Commonly known as:  PRINIVIL,ZESTRIL     TAKE these medications   aspirin 325 MG EC tablet Take 1 tablet (325 mg total) by mouth daily.   furosemide 40 MG tablet Commonly known as:  LASIX Take 1 tablet (40 mg total) by mouth daily.   levothyroxine 100 MCG tablet Commonly known as:  SYNTHROID, LEVOTHROID Take 100 mcg by mouth daily before breakfast.   metoprolol tartrate 25 MG tablet Commonly known as:  LOPRESSOR Take 1 tablet (25 mg total) by mouth 2 (two) times daily.   multivitamin with minerals tablet Take 1 tablet by mouth daily.   potassium chloride SA 20 MEQ tablet Commonly known as:  K-DUR,KLOR-CON Take 1 tablet (20 mEq total) by mouth daily.   simvastatin 20 MG tablet Commonly known as:  ZOCOR Take 20 mg by mouth at bedtime.      Follow-up Information    Rexene Alberts, MD Follow up in 4 week(s).   Specialty:  Cardiothoracic Surgery Why:  Office will  contact you with appointment date and time.  Please get CXR 30 minutes prior to your appointment with Dr. Roxy Manns at [facility] located on first floor of our office building Contact information: [address] [place] [address] [phone]           Signed: John Giovanni 08/29/2017, 8:08 AM

## 2017-08-28 NOTE — Discharge Instructions (Signed)
Aortic Valve Replacement, Care After Refer to this sheet in the next few weeks. These instructions provide you with information about caring for yourself after your procedure. Your health care provider may also give you more specific instructions. Your treatment has been planned according to current medical practices, but problems sometimes occur. Call your health care provider if you have any problems or questions after your procedure. What can I expect after the procedure? After the procedure, it is common to have:  Pain around your incision area.  A small amount of blood or clear fluid coming from your incision.  Follow these instructions at home: Eating and drinking   Follow instructions from your health care provider about eating or drinking restrictions. ? Limit alcohol intake to no more than 1 drink per day for nonpregnant women and 2 drinks per day for men. One drink equals 12 oz of beer, 5 oz of wine, or 1 oz of hard liquor. ? Limit how much caffeine you drink. Caffeine can affect your heart's rate and rhythm.  Drink enough fluid to keep your urine clear or pale yellow.  Eat a heart-healthy diet. This should include plenty of fresh fruits and vegetables. If you eat meat, it should be lean cuts. Avoid foods that are: ? High in salt, saturated fat, or sugar. ? Canned or highly processed. ? Fried. Activity  Return to your normal activities as told by your health care provider. Ask your health care provider what activities are safe for you.  Exercise regularly once you have recovered, as told by your health care provider.  Avoid sitting for more than 2 hours at a time without moving. Get up and move around at least once every 1-2 hours. This helps to prevent blood clots in the legs.  Do not lift anything that is heavier than 10 lb (4.5 kg) until your health care provider approves.  Avoid pushing or pulling things with your arms until your health care provider approves. This  includes pulling on handrails to help you climb stairs. Incision care   Follow instructions from your health care provider about how to take care of your incision. Make sure you: ? Wash your hands with soap and water before you change your bandage (dressing). If soap and water are not available, use hand sanitizer. ? Change your dressing as told by your health care provider. ? Leave stitches (sutures), skin glue, or adhesive strips in place. These skin closures may need to stay in place for 2 weeks or longer. If adhesive strip edges start to loosen and curl up, you may trim the loose edges. Do not remove adhesive strips completely unless your health care provider tells you to do that.  Check your incision area every day for signs of infection. Check for: ? More redness, swelling, or pain. ? More fluid or blood. ? Warmth. ? Pus or a bad smell. Medicines  Take over-the-counter and prescription medicines only as told by your health care provider.  If you were prescribed an antibiotic medicine, take it as told by your health care provider. Do not stop taking the antibiotic even if you start to feel better. Travel  Avoid airplane travel for as long as told by your health care provider.  When you travel, bring a list of your medicines and a record of your medical history with you. Carry your medicines with you. Driving  Ask your health care provider when it is safe for you to drive. Do not drive until your health  care provider approves.  Do not drive or operate heavy machinery while taking prescription pain medicine. Lifestyle   Do not use any tobacco products, such as cigarettes, chewing tobacco, or e-cigarettes. If you need help quitting, ask your health care provider.  Resume sexual activity as told by your health care provider. Do not use medicines for erectile dysfunction unless your health care provider approves, if this applies.  Work with your health care provider to keep your  blood pressure and cholesterol under control, and to manage any other heart conditions that you have.  Maintain a healthy weight. General instructions  Do not take baths, swim, or use a hot tub until your health care provider approves.  Do not strain to have a bowel movement.  Avoid crossing your legs while sitting down.  Check your temperature every day for a fever. A fever may be a sign of infection.  If you are a woman and you plan to become pregnant, talk with your health care provider before you become pregnant.  Wear compression stockings if your health care provider instructs you to do this. These stockings help to prevent blood clots and reduce swelling in your legs.  Tell all health care providers who care for you that you have an artificial (prosthetic) aortic valve. If you have or have had heart disease or endocarditis, tell all health care providers about these conditions as well.  Keep all follow-up visits as told by your health care provider. This is important. Contact a health care provider if:  You develop a skin rash.  You experience sudden, unexplained changes in your weight.  You have more redness, swelling, or pain around your incision.  You have more fluid or blood coming from your incision.  Your incision feels warm to the touch.  You have pus or a bad smell coming from your incision.  You have a fever. Get help right away if:  You develop chest pain that is different from the pain coming from your incision.  You develop shortness of breath or difficulty breathing.  You start to feel light-headed. These symptoms may represent a serious problem that is an emergency. Do not wait to see if the symptoms will go away. Get medical help right away. Call your local emergency services (911 in the [place] Do not drive yourself to the hospital. This information is not intended to replace advice given to you by your health care provider. Make sure you discuss any  questions you have with your health care provider. Document Released: 04/10/2005 Document Revised: 02/28/2016 Document Reviewed: 08/26/2015 Elsevier Interactive Patient Education  2017 [employer].   Endoscopic Saphenous Vein Harvesting, Care After Refer to this sheet in the next few weeks. These instructions provide you with information about caring for yourself after your procedure. Your health care provider may also give you more specific instructions. Your treatment has been planned according to current medical practices, but problems sometimes occur. Call your health care provider if you have any problems or questions after your procedure. What can I expect after the procedure? After the procedure, it is common to have:  Pain.  Bruising.  Swelling.  Numbness.  Follow these instructions at home: Medicine  Take over-the-counter and prescription medicines only as told by your health care provider.  Do not drive or operate heavy machinery while taking prescription pain medicine. Incision care   Follow instructions from your health care provider about how to take care of the cut made during surgery (incision). Make  sure you: ? Wash your hands with soap and water before you change your bandage (dressing). If soap and water are not available, use hand sanitizer. ? Change your dressing as told by your health care provider. ? Leave stitches (sutures), skin glue, or adhesive strips in place. These skin closures may need to be in place for 2 weeks or longer. If adhesive strip edges start to loosen and curl up, you may trim the loose edges. Do not remove adhesive strips completely unless your health care provider tells you to do that.  Check your incision area every day for signs of infection. Check for: ? More redness, swelling, or pain. ? More fluid or blood. ? Warmth. ? Pus or a bad smell. General instructions  Raise (elevate) your legs above the level of your heart while you are  sitting or lying down.  Do any exercises your health care providers have given you. These may include deep breathing, coughing, and walking exercises.  Do not shower, take baths, swim, or use a hot tub unless told by your health care provider.  Wear your elastic stocking if told by your health care provider.  Keep all follow-up visits as told by your health care provider. This is important. Contact a health care provider if:  Medicine does not help your pain.  Your pain gets worse.  You have new leg bruises or your leg bruises get bigger.  You have a fever.  Your leg feels numb.  You have more redness, swelling, or pain around your incision.  You have more fluid or blood coming from your incision.  Your incision feels warm to the touch.  You have pus or a bad smell coming from your incision. Get help right away if:  Your pain is severe.  You develop pain, tenderness, warmth, redness, or swelling in any part of your leg.  You have chest pain.  You have trouble breathing. This information is not intended to replace advice given to you by your health care provider. Make sure you discuss any questions you have with your health care provider. Document Released: 06/04/2011 Document Revised: 02/28/2016 Document Reviewed: 08/06/2015 Elsevier Interactive Patient Education  2018 [employer].

## 2017-08-29 MED ORDER — METOPROLOL TARTRATE 25 MG PO TABS: 25.0000 mg | ORAL_TABLET | Freq: Two times a day (BID) | ORAL | 1 refills | Status: DC

## 2017-08-29 MED ORDER — POTASSIUM CHLORIDE CRYS ER 20 MEQ PO TBCR: 20.0000 meq | EXTENDED_RELEASE_TABLET | Freq: Every day | ORAL | 0 refills | Status: DC

## 2017-08-29 MED ORDER — FUROSEMIDE 40 MG PO TABS: 40.0000 mg | ORAL_TABLET | Freq: Every day | ORAL | 0 refills | Status: DC

## 2017-08-29 MED ORDER — ASPIRIN 325 MG PO TBEC
325.0000 mg | DELAYED_RELEASE_TABLET | Freq: Every day | ORAL | Status: DC
Start: 2017-08-29 — End: 2018-01-22

## 2017-08-29 NOTE — Progress Notes (Addendum)
[address]       [place] [address]             [phone]      4 Days Post-Op Procedure(s) (LRB): CORONARY ARTERY BYPASS GRAFTING (CABG) x two, using left internal mammary artery and left leg greater saphenous vein harvested endoscopically (N/A) AORTIC VALVE REPLACEMENT (AVR) (N/A) TRANSESOPHAGEAL ECHOCARDIOGRAM (TEE) (N/A) Subjective: Feels pretty well, had + BM  Objective: Vital signs in last 24 hours: Temp:  [97.7 F (36.5 C)-98.5 F (36.9 C)] 98.4 F (36.9 C) (11/24 0345) Pulse Rate:  [84-100] 84 (11/23 2349) Cardiac Rhythm: Normal sinus rhythm (11/24 0700) BP: (103-118)/(64-71) 118/71 (11/24 0345) SpO2:  [90 %-99 %] 90 % (11/23 2349) Weight:  [166 lb 1.6 oz (75.3 kg)] 166 lb 1.6 oz (75.3 kg) (11/24 0331)  Hemodynamic parameters for last 24 hours:    Intake/Output from previous day: 11/23 0701 - 11/24 0700 In: -  Out: 200 [Urine:200] Intake/Output this shift: No intake/output data recorded.  General appearance: alert, cooperative and no distress Heart: regular rate and rhythm Lungs: clear to auscultation bilaterally Abdomen: benign Extremities: + edema left>right LE Wound: incis healing well  Lab Results: Recent Labs    08/27/17 0416 08/28/17 0225  WBC 17.0* 7.0  HGB 11.4* 11.1*  HCT 32.9* 32.0*  PLT 113* 93*   BMET:  Recent Labs    08/27/17 0416 08/28/17 0225  NA 133* 132*  K 4.1 4.0  CL 97* 93*  CO2 28 31  GLUCOSE 118* 128*  BUN 19 20  CREATININE 1.49* 1.35*  CALCIUM 8.5* 8.3*    PT/INR: No results for input(s): LABPROT, INR in the last 72 hours. ABG    Component Value Date/Time   PHART 7.354 08/26/2017 0421   HCO3 24.4 08/26/2017 0421   TCO2 27 08/26/2017 1605   ACIDBASEDEF 0.4 08/26/2017 0421   O2SAT 98.9 08/26/2017 0421   CBG (last 3)  Recent Labs    08/27/17 0403 08/27/17 0803 08/27/17 1152  GLUCAP 120* 116* 107*    Meds Scheduled Meds: . acetaminophen  1,000 mg Oral Q6H  . aspirin EC  325  mg Oral Daily  . bisacodyl  10 mg Oral Daily   Or  . bisacodyl  10 mg Rectal Daily  . docusate sodium  200 mg Oral Daily  . furosemide  40 mg Oral Daily  . levothyroxine  100 mcg Oral QAC breakfast  . mouth rinse  15 mL Mouth Rinse BID  . metoprolol tartrate  25 mg Oral BID  . moving right along book   Does not apply Once  . pantoprazole  40 mg Oral Daily  . potassium chloride  20 mEq Oral Daily  . simvastatin  20 mg Oral QHS  . sodium chloride flush  3 mL Intravenous Q12H   Continuous Infusions: . sodium chloride     PRN Meds:.labetalol, metoprolol tartrate, morphine injection, ondansetron (ZOFRAN) IV, simethicone, sodium chloride flush, traMADol  Xrays Dg Chest 2 View  Result Date: 08/28/2017 CLINICAL DATA:  CABG and cardiac valve replacement 08/25/2017. EXAM: CHEST  2 VIEW COMPARISON:  08/27/2017. FINDINGS: Prior CABG is cardiac valve replacement. Air noted in the mediastinum consistent with recent surgery . Cardiomegaly with mild bilateral interstitial prominence and small bilateral pleural effusions suggesting mild CHF. Low lung volumes with mild basilar atelectasis. Distended loops of colon noted. Adynamic ileus may be present. Follow-up exam suggested to demonstrate clearing . IMPRESSION: 1. Prior CABG and cardiac valve  replacement. Air noted in the mediastinum consistent with recent surgery. Cardiomegaly with mild bilateral interstitial prominence and bilateral pleural effusions suggesting mild CHF. Low lung volumes with basilar atelectasis. 2. Distended loops of colon noted. Adynamic ileus may be present. Follow-up exam suggested to demonstrate clearing. Electronically Signed   By: Marcello Moores  Register   On: 08/28/2017 07:04    Assessment/Plan: S/P Procedure(s) (LRB): CORONARY ARTERY BYPASS GRAFTING (CABG) x two, using left internal mammary artery and left leg greater saphenous vein harvested endoscopically (N/A) AORTIC VALVE REPLACEMENT (AVR) (N/A) TRANSESOPHAGEAL ECHOCARDIOGRAM  (TEE) (N/A) Plan for discharge: see discharge orders hemodyn stable , sinus with LBBB, BP is well controlled, would not restart ACE-I or ARB currently No other new labs Ambulated well off O2 yesterday Will cont lasix short term  LOS: 4 days    James Wilkins 08/29/2017   I have seen and examined the patient and agree with the assessment and plan as outlined.  D/C home.  Instructions given.  Rexene Alberts, MD 08/29/2017 10:00 AM

## 2017-08-31 MED FILL — Potassium Chloride Inj 2 mEq/ML: INTRAVENOUS | Qty: 20 | Status: AC

## 2017-08-31 MED FILL — Magnesium Sulfate Inj 50%: INTRAMUSCULAR | Qty: 10 | Status: AC

## 2017-08-31 MED FILL — Heparin Sodium (Porcine) Inj 1000 Unit/ML: INTRAMUSCULAR | Qty: 30 | Status: AC

## 2017-09-01 ENCOUNTER — Other Ambulatory Visit: Payer: Self-pay | Admitting: *Deleted

## 2017-09-01 DIAGNOSIS — G8918 Other acute postprocedural pain: Secondary | ICD-10-CM

## 2017-09-01 MED ORDER — TRAMADOL HCL 50 MG PO TABS: 50.0000 mg | ORAL_TABLET | Freq: Four times a day (QID) | ORAL | 0 refills | Status: DC | PRN

## 2017-09-03 ENCOUNTER — Telehealth: Payer: Self-pay

## 2017-09-03 ENCOUNTER — Telehealth: Payer: Self-pay | Admitting: Cardiovascular Disease

## 2017-09-03 DIAGNOSIS — K769 Liver disease, unspecified: Secondary | ICD-10-CM

## 2017-09-03 NOTE — Telephone Encounter (Signed)
Pt needed a date added to his [employer] disability paperwork. Dr.Cooper nurse Valetta Fuller did this for me. Called Wife back ( on [place]) she is aware dats added and paperwork left at front desk for pick up.

## 2017-09-03 NOTE — Telephone Encounter (Signed)
Informed patient of results and verbal understanding expressed.   Repeat US ordered to be scheduled next November. Patient agrees with treatment plan.

## 2017-09-03 NOTE — Telephone Encounter (Signed)
-----   Message from [name], MD sent at 08/18/2017  4:37 PM EST ----- Study reviewed.  1 year follow-up ultrasound is recommended.

## 2017-09-07 ENCOUNTER — Inpatient Hospital Stay (HOSPITAL_COMMUNITY): Admission: RE | Admit: 2017-09-07 | Payer: 59 | Source: Ambulatory Visit

## 2017-09-07 ENCOUNTER — Encounter: Payer: 59 | Admitting: Thoracic Surgery (Cardiothoracic Vascular Surgery)

## 2017-09-07 ENCOUNTER — Other Ambulatory Visit (HOSPITAL_COMMUNITY): Payer: 59

## 2017-09-07 ENCOUNTER — Encounter (HOSPITAL_COMMUNITY): Payer: 59

## 2017-09-15 ENCOUNTER — Ambulatory Visit: Payer: No Typology Code available for payment source | Admitting: Physician Assistant

## 2017-09-17 ENCOUNTER — Encounter: Payer: Self-pay | Admitting: Cardiovascular Disease

## 2017-09-17 ENCOUNTER — Ambulatory Visit: Payer: 59 | Admitting: Cardiovascular Disease

## 2017-09-17 VITALS — BP 116/70 | HR 63 | Ht 65.0 in | Wt 162.1 lb

## 2017-09-17 DIAGNOSIS — Z951 Presence of aortocoronary bypass graft: Secondary | ICD-10-CM

## 2017-09-17 DIAGNOSIS — I2510 Atherosclerotic heart disease of native coronary artery without angina pectoris: Secondary | ICD-10-CM | POA: Diagnosis not present

## 2017-09-17 DIAGNOSIS — Z952 Presence of prosthetic heart valve: Secondary | ICD-10-CM

## 2017-09-17 NOTE — Patient Instructions (Addendum)
Medication Instructions:  1) You may decrease your ASPIRIN to 81 mg daily 3 months after your procedure (around November 25, 2017).  Labwork: TODAY: CBC  Testing/Procedures: Your provider has requested that you have an echocardiogram. Echocardiography is a painless test that uses sound waves to create images of your heart. It provides your doctor with information about the size and shape of your heart and how well your heart's chambers and valves are working. This procedure takes approximately one hour. There are no restrictions for this procedure.  Follow-Up: Your provider recommends that you schedule a follow-up appointment in 3 months with Richardson Dopp, PA.  Any Other Special Instructions Will Be Listed Below (If Applicable).     If you need a refill on your cardiac medications before your next appointment, please call your pharmacy.

## 2017-09-17 NOTE — Progress Notes (Signed)
Cardiology Office Note Date:  09/17/2017   ID:  James, Wilkins Feb 29, 1961, MRN [id number]  PCP:  Marton Redwood, MD  Cardiologist:  [name], MD    Chief Complaint  Patient presents with  . Follow-up    Post-AVR Visit  . Aortic Stenosis     History of Present Illness: James Wilkins is a 56 y.o. male who presents for follow-up of coronary artery and aortic valve disease.  He was initially seen in October 2018 after being diagnosed with severe bicuspid aortic valve stenosis.  Preoperative cardiac catheterization demonstrated severe stenosis of the LAD and left circumflex and the patient was referred for consideration of CABG/aortic valve replacement.  On August 25, 2017 the patient underwent CABG/AVR with grafting of the LIMA to LAD and saphenous vein graft to the first OM and AVR using a 25 mm Edwards Intuity bovine pericardial tissue valve.  The patient's postoperative course was uncomplicated and he underwent hospital discharge on postoperative day #4.  The patient is here with his wife today.  He is doing well.  Complains of expected positional chest soreness, but no other chest pain or pressure.  Denies shortness of breath.  He has had mild leg swelling involving the left leg which was the saphenous vein graft harvest side.  No heart palpitations, orthopnea, or PND.  He complains of mild lightheadedness with positional change.  He is walking 30 minutes on the treadmill without exertional symptoms.  He does complain of blood in his stool.  He has a history of hemorrhoids and has seen bright red blood in the stool since discharge from the hospital.  He denies black or tarry stools.  Past Medical History:  Diagnosis Date  . Aortic stenosis   . Arthritis    "joints" (08/27/2017)  . Bicuspid aortic valve   . Celiac disease   . Coronary artery disease involving native coronary artery of native heart with angina pectoris ([place])   . Dilation of esophagus    tortuous esophagus  .  Dyspnea   . Gastritis    non erosive  . GERD (gastroesophageal reflux disease)   . Heart murmur    "ever since I was little" (08/27/2017)  . Hemorrhoids   . Hiatal hernia   . HLD (hyperlipidemia)   . Hypertension   . Hypothyroidism   . S/P aortic valve replacement with bioprosthetic valve 08/25/2017   25 mm Edwards Intuity Elite bovine pericardial stented rapid deployment valve    Past Surgical History:  Procedure Laterality Date  . AORTIC VALVE REPLACEMENT N/A 08/25/2017   Procedure: AORTIC VALVE REPLACEMENT (AVR);  Surgeon: Rexene Alberts, MD;  Location: [facility];  Service: Open Heart Surgery;  Laterality: N/A;  . AORTIC VALVE REPLACEMENT (AVR)/CORONARY ARTERY BYPASS GRAFTING (CABG)  08/25/2017   25 mm Edwards Intuity Elite bovine pericardial stented rapid deployment valve  . BACK SURGERY    . BLADDER SURGERY     "weak spots in my bladder; they took them out"  . CARDIAC CATHETERIZATION  08/12/2017  . CARDIAC VALVE REPLACEMENT    . COLONOSCOPY  2009  . CORONARY ANGIOPLASTY    . CORONARY ARTERY BYPASS GRAFT  08/25/2017   LIMA to LAD, SVG to [place] Intermediate, EVH via left thigh  . CORONARY ARTERY BYPASS GRAFT N/A 08/25/2017   Procedure: CORONARY ARTERY BYPASS GRAFTING (CABG) x two, using left internal mammary artery and left leg greater saphenous vein harvested endoscopically;  Surgeon: Rexene Alberts, MD;  Location: [facility];  Service: Open Heart Surgery;  Laterality: N/A;  . ESOPHAGOGASTRODUODENOSCOPY (EGD) WITH ESOPHAGEAL DILATION  2008  . [place]; 2016 X 2  . SHOULDER ARTHROSCOPY W/ ROTATOR CUFF REPAIR Right 2012  . SHOULDER ARTHROSCOPY W/ ROTATOR CUFF REPAIR Left 2014 X 2  . TEE WITHOUT CARDIOVERSION N/A 08/25/2017   Procedure: TRANSESOPHAGEAL ECHOCARDIOGRAM (TEE);  Surgeon: Rexene Alberts, MD;  Location: [facility];  Service: Open Heart Surgery;  Laterality: N/A;    Current Outpatient Medications  Medication Sig Dispense Refill  . aspirin EC 325 MG EC  tablet Take 1 tablet (325 mg total) by mouth daily.    Marland Kitchen levothyroxine (SYNTHROID, LEVOTHROID) 100 MCG tablet Take 100 mcg by mouth daily before breakfast.    . metoprolol tartrate (LOPRESSOR) 25 MG tablet Take 1 tablet (25 mg total) by mouth 2 (two) times daily. 60 tablet 1  . Multiple Vitamins-Minerals (MULTIVITAMIN WITH MINERALS) tablet Take 1 tablet by mouth daily.    . Probiotic Product (PROBIOTIC PO) Take 1 capsule by mouth daily.    . simvastatin (ZOCOR) 20 MG tablet Take 20 mg by mouth at bedtime.    . traMADol (ULTRAM) 50 MG tablet Take 50 mg by mouth every 6 (six) hours as needed (pain).     No current facility-administered medications for this visit.     Allergies:   Wheat bran   Social History:  The patient  reports that  has never smoked. he has never used smokeless tobacco. He reports that he does not drink alcohol or use drugs.   Family History:  The patient's family history includes Brain cancer in his father; Cancer in his other; Colon polyps in his sister; Diabetes in his father; Heart disease in his father and mother; Hypertension in his other.    ROS:  Please see the history of present illness.  All other systems are reviewed and negative.    PHYSICAL EXAM: VS:  BP 116/70   Pulse 63   Ht 5' 5"  (1.651 m)   Wt 162 lb 1.9 oz (73.5 kg)   BMI 26.98 kg/m  , BMI Body mass index is 26.98 kg/m. GEN: Well nourished, well developed, in no acute distress  HEENT: normal  Neck: no JVD, no masses. No carotid bruits Cardiac: RRR with 2/6 SEM at the RUSB, no diastolic murmur             Respiratory:  clear to auscultation bilaterally, normal work of breathing GI: soft, nontender, nondistended, + BS MS: no deformity or atrophy  Ext: no pretibial edema, pedal pulses 2+= bilaterally. SVG harvest sites clear on left leg Skin: warm and dry, no rash Neuro:  Strength and sensation are intact Psych: euthymic mood, full affect  EKG:  EKG is ordered today. The ekg ordered today  shows normal sinus rhythm 63 bpm, left bundle branch block  Recent Labs: 08/21/2017: ALT 38; B Natriuretic Peptide 11.4 08/26/2017: Magnesium 2.3 08/28/2017: BUN 20; Creatinine, Ser 1.35; Hemoglobin 11.1; Platelets 93; Potassium 4.0; Sodium 132   Lipid Panel  No results found for: CHOL, TRIG, HDL, CHOLHDL, VLDL, LDLCALC, LDLDIRECT    Wt Readings from Last 3 Encounters:  09/17/17 162 lb 1.9 oz (73.5 kg)  08/29/17 166 lb 1.6 oz (75.3 kg)  08/24/17 160 lb (72.6 kg)     ASSESSMENT AND PLAN: 1.  Aortic valve disease status post bioprosthetic aortic valve replacement: The patient's exam suggests normal function of his aortic valve bioprosthesis.  I have recommended an echocardiogram to  establish a postoperative baseline.  He understands the indication for SBE prophylaxis.  He will continue on aspirin 325 mg daily for a period of 3 months, then reduce his aspirin dose to 81 mg daily.  Will check a CBC to make sure he is not having significant blood loss from his lower GI tract.  I suspect his bleeding is hemorrhoidal.  2.  Coronary artery disease, native vessel, without angina: He seems to be progressing well after surgery.  He is walking 30 minutes on the treadmill.  Phase 2 cardiac rehab will cost him $60 per session and he is not inclined to participate.  We reviewed instructions for slowly increasing his physical activity.  3.  Left bundle branch block: Related to aortic valve replacement.  No symptoms of arrhythmia.  Seems to be tolerating a beta-blocker.  I reviewed his home blood pressure readings which are in a good range.  4.  Hyperlipidemia: He is treated with simvastatin and followed by Dr. Brigitte Pulse.  Overall Mr Cross is progressing well after cardiac surgery. He will have a CBC today and will be scheduled for a post-op echocardiogram. Will arrange 3 month APP follow-up.   Current medicines are reviewed with the patient today.  The patient does not have concerns regarding  medicines.  Labs/ tests ordered today include:   Orders Placed This Encounter  Procedures  . CBC with Differential/Platelet  . EKG 12-Lead  . ECHOCARDIOGRAM COMPLETE    Disposition:   FU 3 months with APP  Signed, [name], MD  09/17/2017 5:54 PM    [facility] [address], [place],   [address] Phone: [phone]; Fax: [phone]

## 2017-09-18 LAB — CBC WITH DIFFERENTIAL/PLATELET
BASOS: 1 %
Basophils Absolute: 0 10*3/uL (ref 0.0–0.2)
EOS (ABSOLUTE): 0.2 10*3/uL (ref 0.0–0.4)
Eos: 3 %
Hematocrit: 34.2 % — ABNORMAL LOW (ref 37.5–51.0)
Hemoglobin: 11.4 g/dL — ABNORMAL LOW (ref 13.0–17.7)
IMMATURE GRANULOCYTES: 0 %
Immature Grans (Abs): 0 10*3/uL (ref 0.0–0.1)
LYMPHS ABS: 1.5 10*3/uL (ref 0.7–3.1)
Lymphs: 24 %
MCH: 29.8 pg (ref 26.6–33.0)
MCHC: 33.3 g/dL (ref 31.5–35.7)
MCV: 90 fL (ref 79–97)
MONOS ABS: 0.7 10*3/uL (ref 0.1–0.9)
Monocytes: 10 %
NEUTROS PCT: 62 %
Neutrophils Absolute: 3.9 10*3/uL (ref 1.4–7.0)
PLATELETS: 422 10*3/uL — AB (ref 150–379)
RBC: 3.82 x10E6/uL — ABNORMAL LOW (ref 4.14–5.80)
RDW: 13.5 % (ref 12.3–15.4)
WBC: 6.3 10*3/uL (ref 3.4–10.8)

## 2017-09-22 ENCOUNTER — Other Ambulatory Visit: Payer: Self-pay

## 2017-09-22 ENCOUNTER — Other Ambulatory Visit: Payer: Self-pay | Admitting: Thoracic Surgery (Cardiothoracic Vascular Surgery)

## 2017-09-22 MED ORDER — TRAMADOL HCL 50 MG PO TABS: 50.0000 mg | ORAL_TABLET | Freq: Four times a day (QID) | ORAL | 0 refills | Status: DC | PRN

## 2017-09-23 ENCOUNTER — Other Ambulatory Visit: Payer: Self-pay | Admitting: *Deleted

## 2017-09-24 ENCOUNTER — Ambulatory Visit (HOSPITAL_COMMUNITY): Payer: 59 | Attending: Cardiovascular Disease

## 2017-09-24 ENCOUNTER — Other Ambulatory Visit: Payer: Self-pay

## 2017-09-24 DIAGNOSIS — Z952 Presence of prosthetic heart valve: Secondary | ICD-10-CM

## 2017-09-24 DIAGNOSIS — E785 Hyperlipidemia, unspecified: Secondary | ICD-10-CM | POA: Insufficient documentation

## 2017-09-24 DIAGNOSIS — I340 Nonrheumatic mitral (valve) insufficiency: Secondary | ICD-10-CM | POA: Diagnosis not present

## 2017-09-24 DIAGNOSIS — Q231 Congenital insufficiency of aortic valve: Secondary | ICD-10-CM | POA: Insufficient documentation

## 2017-10-02 ENCOUNTER — Other Ambulatory Visit: Payer: Self-pay | Admitting: Thoracic Surgery (Cardiothoracic Vascular Surgery)

## 2017-10-02 DIAGNOSIS — Z951 Presence of aortocoronary bypass graft: Secondary | ICD-10-CM

## 2017-10-05 ENCOUNTER — Ambulatory Visit
Admission: RE | Admit: 2017-10-05 | Discharge: 2017-10-05 | Disposition: A | Discharge status: Home / Self Care | Payer: 59 | Source: Ambulatory Visit | Attending: Thoracic Surgery (Cardiothoracic Vascular Surgery) | Admitting: Thoracic Surgery (Cardiothoracic Vascular Surgery)

## 2017-10-05 ENCOUNTER — Ambulatory Visit: Payer: No Typology Code available for payment source | Admitting: Thoracic Surgery (Cardiothoracic Vascular Surgery)

## 2017-10-05 ENCOUNTER — Encounter: Payer: Self-pay | Admitting: Physician Assistant

## 2017-10-05 ENCOUNTER — Ambulatory Visit (INDEPENDENT_AMBULATORY_CARE_PROVIDER_SITE_OTHER): Payer: Self-pay | Admitting: Physician Assistant

## 2017-10-05 VITALS — BP 125/68 | HR 62 | Resp 16 | Ht 65.0 in | Wt 162.6 lb

## 2017-10-05 DIAGNOSIS — Q231 Congenital insufficiency of aortic valve: Secondary | ICD-10-CM

## 2017-10-05 DIAGNOSIS — Z953 Presence of xenogenic heart valve: Secondary | ICD-10-CM

## 2017-10-05 DIAGNOSIS — Z951 Presence of aortocoronary bypass graft: Secondary | ICD-10-CM

## 2017-10-05 DIAGNOSIS — I350 Nonrheumatic aortic (valve) stenosis: Secondary | ICD-10-CM

## 2017-10-05 DIAGNOSIS — Q2381 Bicuspid aortic valve: Secondary | ICD-10-CM

## 2017-10-05 DIAGNOSIS — I25119 Atherosclerotic heart disease of native coronary artery with unspecified angina pectoris: Secondary | ICD-10-CM

## 2017-10-05 NOTE — Patient Instructions (Addendum)
You may continue to gradually increase your physical activity as tolerated.  Refrain from any heavy lifting or strenuous use of your arms and shoulders until at least 8 weeks from the time of your surgery, and avoid activities that cause increased pain in your chest on the side of your surgical incision.  Otherwise, you may continue to increase activities without any particular limitations.  Increase the intensity and duration of physical activity gradually.  Endocarditis is a potentially serious infection of heart valves or inside lining of the heart.  It occurs more commonly in patients with diseased heart valves (such as patient's with aortic or mitral valve disease) and in patients who have undergone heart valve repair or replacement.  Certain surgical and dental procedures may put you at risk, such as dental cleaning, other dental procedures, or any surgery involving the respiratory, urinary, gastrointestinal tract, gallbladder or prostate gland.   To minimize your chances for develooping endocarditis, maintain good oral health and seek prompt medical attention for any infections involving the mouth, teeth, gums, skin or urinary tract.    Always notify your doctor or dentist about your underlying heart valve condition before having any invasive procedures. You will need to take antibiotics before certain procedures, including all routine dental cleanings or other dental procedures.  Your cardiologist or dentist should prescribe these antibiotics for you to be taken ahead of time.     You may return to driving an automobile as long as you are no longer requiring oral narcotic pain relievers during the daytime.  It would be wise to start driving only short distances during the daylight and gradually increase from there as you feel comfortable.

## 2017-10-05 NOTE — Progress Notes (Signed)
  HPI:  Patient returns for routine postoperative follow-up having undergone a CABG x 2 and aortic valve replacement Oletta Lamas Intuity Elite rapid deployment bovine pericardial tissue valve) on 08/25/2017 by Dr. Roxy Manns. Since hospital discharge the patient denies chest pain, shortness of breath, fever, or chills.  Current Outpatient Medications  Medication Sig Dispense Refill  . aspirin EC 325 MG EC tablet Take 1 tablet (325 mg total) by mouth daily.    Marland Kitchen levothyroxine (SYNTHROID, LEVOTHROID) 100 MCG tablet Take 100 mcg by mouth daily before breakfast.    . metoprolol tartrate (LOPRESSOR) 25 MG tablet Take 1 tablet (25 mg total) by mouth 2 (two) times daily. 60 tablet 1  . Multiple Vitamins-Minerals (MULTIVITAMIN WITH MINERALS) tablet Take 1 tablet by mouth daily.    . Probiotic Product (PROBIOTIC PO) Take 1 capsule by mouth daily.    . simvastatin (ZOCOR) 20 MG tablet Take 20 mg by mouth at bedtime.    . traMADol (ULTRAM) 50 MG tablet Take 1 tablet (50 mg total) by mouth every 6 (six) hours as needed. for pain 30 tablet 0  Vital Signs: BP 125/68, HR 62, RR 16, Oxygenation 97% on room air.   Physical Exam: CV-RRR, systolic ejection murmur II/VI Pulmonary-Clear to auscultation bilaterally Extremities-No LE edema Wounds-Clean and dry. Remnant of suture removed from LLE   Diagnostic Tests: CLINICAL DATA:  Aortic valve replacement 08/25/2017.  EXAM: CHEST  2 VIEW  COMPARISON:  Two-view chest x-ray 08/28/2017  FINDINGS: Heart is enlarged. Median sternotomy present. CABG and aortic valve replacement is stable. A small left pleural effusion and basilar atelectasis remains. This has improved significantly. The right lung is clear. Lung volumes remain low.  IMPRESSION: 1. Marked improvement in left lower lobe atelectasis and effusion with minimal disease remaining. 2. Postsurgical changes of CABG and aortic valve replacement.   Electronically Signed   By: San [place]  M.D.   On: 10/05/2017 14:45  Impression and Plan: Overall, Mr. Goetze is recovering well from coronary artery bypass grafting surgery and aortic valve replacement. He has already seen Dr. Burt Knack in follow up. Echo done 12/20 showed LVEF 60-65%, no wall motion abnormalities, peak AV gradient 18 mm Hg, no AS or AI, no MS and mild MR. He was instructed to take ecasa 325 mg daily for 3 months then decrease to 81 mg daily thereafter. He has already been driving. He has to pay out of pocket for cardiac rehab and does not wish to do this. Finally, he was instructed to continue with sternal precautions until 10/29/2017;at that time, he may resume normal activities. We did discuss the need for antibiotics prior to undergoing any dental procedure as he has had aortic valve replacement surgery. He will return to see Dr. Roxy Manns in 4-6 weeks and he will follow up with cardiology at their discretion.     Nani Skillern, PA-C [facility] [phone]

## 2017-10-28 ENCOUNTER — Other Ambulatory Visit: Payer: Self-pay

## 2017-10-28 ENCOUNTER — Other Ambulatory Visit: Payer: Self-pay | Admitting: Thoracic Surgery (Cardiothoracic Vascular Surgery)

## 2017-10-28 MED ORDER — METOPROLOL TARTRATE 25 MG PO TABS: 25.0000 mg | ORAL_TABLET | Freq: Two times a day (BID) | ORAL | 10 refills | Status: DC

## 2017-11-16 ENCOUNTER — Ambulatory Visit: Payer: 59 | Admitting: Thoracic Surgery (Cardiothoracic Vascular Surgery)

## 2017-11-17 ENCOUNTER — Ambulatory Visit (INDEPENDENT_AMBULATORY_CARE_PROVIDER_SITE_OTHER): Payer: Self-pay | Admitting: Physician Assistant

## 2017-11-17 VITALS — BP 147/80 | HR 55 | Resp 20 | Ht 65.0 in | Wt 168.0 lb

## 2017-11-17 DIAGNOSIS — Z953 Presence of xenogenic heart valve: Secondary | ICD-10-CM

## 2017-11-17 NOTE — Progress Notes (Addendum)
HPI:  Patient returns for 3 month follow up for his AVR/CABG x 2.  Him and his wife questioned why they aren't seeing Dr. Roxy Manns as they were told they would be seeing him.  The patient states that he is doing great.  He has no complaints and is eager to be able to increase his activity level.  He has resumed most of his activities including going to the gym and has been using his bowflex without difficulty.  He is ambulating without issues.  His weight is stable and he has no lower extremity edema.  Current Outpatient Medications  Medication Sig Dispense Refill  . aspirin EC 325 MG EC tablet Take 1 tablet (325 mg total) by mouth daily.    Marland Kitchen levothyroxine (SYNTHROID, LEVOTHROID) 100 MCG tablet Take 100 mcg by mouth daily before breakfast.    . lisinopril (PRINIVIL,ZESTRIL) 10 MG tablet Take 10 mg by mouth daily.    . metoprolol tartrate (LOPRESSOR) 25 MG tablet Take 1 tablet (25 mg total) by mouth 2 (two) times daily. 60 tablet 10  . Multiple Vitamins-Minerals (MULTIVITAMIN WITH MINERALS) tablet Take 1 tablet by mouth daily.    . Probiotic Product (PROBIOTIC PO) Take 1 capsule by mouth daily.    . simvastatin (ZOCOR) 20 MG tablet Take 20 mg by mouth at bedtime.     No current facility-administered medications for this visit.     Physical Exam:  BP (!) 147/80   Pulse (!) 55   Resp 20   Ht 5' 5"  (1.651 m)   Wt 168 lb (76.2 kg)   SpO2 97% Comment: RA  BMI 27.96 kg/m   Gen: no apparent distress Heart: RRR Lungs: CTA bilaterally Ext: no edema present Incisions: clean and well healed  A/P:  1. S/P AVR, CABG- patient is about 3 months after surgery-- doing very well 2. Mild Hypertension- will restart patient's home Lisinopril at 10 mg daily 3. Activity- continue to increase as tolerated.  Encouraged patient to becareful with weight lifting and to gradually increase his weight and if at any time he develops sternal discomfort he should decrease his activity level 4. RTC in 3 months for 6  months for follow up on his AVR/CABG x 2  Ellwood Handler, PA-C [facility] [phone]

## 2017-11-17 NOTE — Patient Instructions (Signed)
Endocarditis is a potentially serious infection of heart valves or inside lining of the heart.  It occurs more commonly in patients with diseased heart valves (such as patient's with aortic or mitral valve disease) and in patients who have undergone heart valve repair or replacement.  Certain surgical and dental procedures may put you at risk, such as dental cleaning, other dental procedures, or any surgery involving the respiratory, urinary, gastrointestinal tract, gallbladder or prostate gland.   To minimize your chances for develooping endocarditis, maintain good oral health and seek prompt medical attention for any infections involving the mouth, teeth, gums, skin or urinary tract.    Always notify your doctor or dentist about your underlying heart valve condition before having any invasive procedures. You will need to take antibiotics before certain procedures, including all routine dental cleanings or other dental procedures.  Your cardiologist or dentist should prescribe these antibiotics for you to be taken ahead of time.

## 2018-01-22 ENCOUNTER — Encounter: Payer: Self-pay | Admitting: Physician Assistant

## 2018-01-22 ENCOUNTER — Ambulatory Visit: Payer: BLUE CROSS/BLUE SHIELD | Admitting: Physician Assistant

## 2018-01-22 VITALS — BP 120/62 | HR 55 | Ht 65.0 in | Wt 167.8 lb

## 2018-01-22 DIAGNOSIS — Z953 Presence of xenogenic heart valve: Secondary | ICD-10-CM | POA: Diagnosis not present

## 2018-01-22 DIAGNOSIS — I447 Left bundle-branch block, unspecified: Secondary | ICD-10-CM

## 2018-01-22 DIAGNOSIS — R42 Dizziness and giddiness: Secondary | ICD-10-CM

## 2018-01-22 DIAGNOSIS — I2510 Atherosclerotic heart disease of native coronary artery without angina pectoris: Secondary | ICD-10-CM

## 2018-01-22 DIAGNOSIS — E785 Hyperlipidemia, unspecified: Secondary | ICD-10-CM | POA: Diagnosis not present

## 2018-01-22 MED ORDER — METOPROLOL TARTRATE 25 MG PO TABS: 12.5000 mg | ORAL_TABLET | Freq: Two times a day (BID) | ORAL | 11 refills | Status: DC

## 2018-01-22 NOTE — Progress Notes (Signed)
Cardiology Office Note:    Date:  01/22/2018   ID:  [name], DOB [birth year], MRN [id number]  PCP:  James Redwood, MD  Cardiologist:  James Mocha, MD   Referring MD: James Redwood, MD   Chief Complaint  Patient presents with  . Follow-up    CAD, s/p AVR  . Dizziness    History of Present Illness:    James Wilkins is a 57 y.o. male with coronary artery disease and aortic stenosis status post CABG + bioprosthetic AVR 08/2017.  He was last seen by Dr. Burt Wilkins 09/2017.  Mr. James Wilkins returns for follow up. He is here today with his wife.  He has gone back to work and does feel some pressure in his chest around his scar when he does heavy lifting.  His symptoms are not like his pain he had prior to his CABG.  He denies shortness of breath, paroxysmal nocturnal dyspnea, edema.  He does note frequent episodes (2 times a week) of dizziness described as near syncope.  It occurs while active and while sitting.  He denies syncope.    Prior CV studies:   The following studies were reviewed today:  Echo 09/24/17 EF 60-65, normal wall motion, grade 2 diastolic dysfunction, normally functioning bioprosthetic AVR with mean gradient 10/peak 18, mild MR  Preoperative Dopplers 08/21/17 Final Interpretation: Right Carotid: There is evidence in the right ICA of a 1-39% stenosis. Left Carotid: There is evidence in the left ICA of a 1-39% stenosis. Vertebrals: Both vertebral arteries were patent with antegrade flow. Right ABI: Resting right ankle-brachial index is within normal range. No evidence of significant right lower extremity arterial disease. Left ABI: Resting left ankle-brachial index is within normal range. No evidence of significant left lower extremity arterial disease.  Cardiac catheterization 08/12/17 Results for orders placed during the hospital encounter of 08/12/17  CARDIAC CATHETERIZATION   Narrative 1.  Severe LAD stenosis (85%) at the level of the first septal perforator 2.   Severe ostial left circumflex stenosis (90, 80%) 3.  Mild nonobstructive stenosis of the right coronary artery (40%) 4.  Normal right heart hemodynamics 5.  Known severe aortic stenosis  The patient will continue evaluation for CABG/AVR.  Anticipate grafting of  the LAD and large first OM branches.   Echo 07/10/17 EF 55-60, normal wall motion, normal diastolic function, bicuspid aortic valve with severe aortic stenosis (mean 45, peak 75), mildly dilated ascending aorta (38 mm), mild TR, PASP 30  Past Medical History:  Diagnosis Date  . Aortic stenosis   . Arthritis    "joints" (08/27/2017)  . Bicuspid aortic valve   . Celiac disease   . Coronary artery disease involving native coronary artery of native heart with angina pectoris ([place])   . Dilation of esophagus    tortuous esophagus  . Dyspnea   . Gastritis    non erosive  . GERD (gastroesophageal reflux disease)   . Heart murmur    "ever since I was little" (08/27/2017)  . Hemorrhoids   . Hiatal hernia   . HLD (hyperlipidemia)   . Hypertension   . Hypothyroidism   . S/P aortic valve replacement with bioprosthetic valve 08/25/2017   25 mm Edwards Intuity Elite bovine pericardial stented rapid deployment valve   Surgical Hx: The patient  has a past surgical history that includes Colonoscopy (2009); Bladder surgery; Cardiac valve replacement; Aortic valve replacement (avr)/coronary artery bypass grafting (cabg) (08/25/2017); Shoulder arthroscopy w/ rotator cuff repair (Right, 2012); Shoulder arthroscopy  w/ rotator cuff repair (Left, 2014 X 2); Back surgery; Lumbar disc surgery (1999; 2016 X 2); Cardiac catheterization (08/12/2017); Coronary angioplasty; Esophagogastroduodenoscopy (egd) with esophageal dilation (2008); Coronary artery bypass graft (08/25/2017); Coronary artery bypass graft (N/A, 08/25/2017); Aortic valve replacement (N/A, 08/25/2017); and TEE without cardioversion (N/A, 08/25/2017).   Current Medications: Current  Meds  Medication Sig  . aspirin EC 81 MG tablet Take 81 mg by mouth daily.  Marland Kitchen levothyroxine (SYNTHROID, LEVOTHROID) 100 MCG tablet Take 100 mcg by mouth daily before breakfast.  . lisinopril (PRINIVIL,ZESTRIL) 10 MG tablet Take 10 mg by mouth daily.  . Multiple Vitamins-Minerals (MULTIVITAMIN WITH MINERALS) tablet Take 1 tablet by mouth daily.  . Probiotic Product (PROBIOTIC PO) Take 1 capsule by mouth daily.  . simvastatin (ZOCOR) 20 MG tablet Take 20 mg by mouth at bedtime.  . [DISCONTINUED] aspirin EC 325 MG EC tablet Take 1 tablet (325 mg total) by mouth daily.  . [DISCONTINUED] metoprolol tartrate (LOPRESSOR) 25 MG tablet Take 1 tablet (25 mg total) by mouth 2 (two) times daily.     Allergies:   Wheat bran   Social History   Tobacco Use  . Smoking status: Never Smoker  . Smokeless tobacco: Never Used  Substance Use Topics  . Alcohol use: No  . Drug use: No     Family Hx: The patient's family history includes Brain cancer in his father; Cancer in his other; Colon polyps in his sister; Diabetes in his father; Heart disease in his father and mother; Hypertension in his other. There is no history of Colon cancer, Kidney disease, or Esophageal cancer.  ROS:   Please see the history of present illness.    Review of Systems  Respiratory: Positive for cough.    All other systems reviewed and are negative.  EKGs/Labs/Other Test Reviewed:    EKG:  EKG is  ordered today.  The ekg ordered today demonstrates sinus brady, HR 55, LBBB  Recent Labs: 08/21/2017: ALT 38; B Natriuretic Peptide 11.4 08/26/2017: Magnesium 2.3 08/28/2017: BUN 20; Creatinine, Ser 1.35; Potassium 4.0; Sodium 132 09/17/2017: Hemoglobin 11.4; Platelets 422   Recent Lipid Panel No results found for: CHOL, TRIG, HDL, CHOLHDL, LDLCALC, LDLDIRECT  Physical Exam:    VS:  BP 120/62   Pulse (!) 55   Ht 5' 5"  (1.651 m)   Wt 167 lb 12.8 oz (76.1 kg)   SpO2 97%   BMI 27.92 kg/m     Wt Readings from Last 3  Encounters:  01/22/18 167 lb 12.8 oz (76.1 kg)  11/17/17 168 lb (76.2 kg)  10/05/17 162 lb 9.6 oz (73.8 kg)     Physical Exam  Constitutional: He is oriented to person, place, and time. He appears well-developed and well-nourished. No distress.  HENT:  Head: Normocephalic and atraumatic.  Neck: Neck supple. No JVD present.  Cardiovascular: Normal rate, regular rhythm, S1 normal and S2 normal.  Murmur heard.  Low-pitched systolic murmur is present with a grade of 2/6 at the upper right sternal border. Pulmonary/Chest: Breath sounds normal. He has no rales.  Abdominal: Soft. There is no hepatomegaly.  Musculoskeletal: He exhibits no edema.  Neurological: He is alert and oriented to person, place, and time.  Skin: Skin is warm and dry.    ASSESSMENT & PLAN:    #1.  Dizziness He notes frequent episodes of dizziness/near syncope since his surgery.  There has been no significant change and he denies frank syncope.  He has evidence of conduction system disease with  LBBB on his ECG an his HR is in the 50s.  He is on beta-blocker therapy.  I suspect he is having bradyarrhythmias causing his symptoms.  I will reduce his beta-blocker and consider an event monitor if his symptoms continue.   -Decrease Metoprolol Tartrate to 12.5 mg Twice daily   -If he has recurrent dizziness on lower dose beta-blocker, DC Metoprolol Tartrate  -Arrange Event Monitor x 2 weeks if he has to stop beta-blocker  #2.  Coronary artery disease S/p CABG.  He has some discomfort that sounds like it is from healing of his sternotomy.  He denies any pain like his previous angina.  Continue ASA, statin.  #3.  S/P aortic valve replacement with bioprosthetic valve + CABG x2 Normal functioning AVR on echo in 12/18.  Continue SBE prophylaxis.  #4.  Hyperlipidemia, unspecified hyperlipidemia type Goal LDL < 70.  Simvastatin managed by PCP.  #5.  LBBB (left bundle branch block) Old.  I explained this to him today.  As noted,  with LBBB and dizziness, will decrease (possibly stop) beta-blocker.    Dispo:  Return in about 1 month (around 02/19/2018) for Close Follow Up, w/ Dr. Burt Wilkins, or Richardson Dopp, PA-C.   Medication Adjustments/Labs and Tests Ordered: Current medicines are reviewed at length with the patient today.  Concerns regarding medicines are outlined above.  Tests Ordered: Orders Placed This Encounter  Procedures  . EKG 12-Lead   Medication Changes: Meds ordered this encounter  Medications  . metoprolol tartrate (LOPRESSOR) 25 MG tablet    Sig: Take 0.5 tablets (12.5 mg total) by mouth 2 (two) times daily.    Dispense:  30 tablet    Refill:  11    Dose decreased    Order Specific Question:   Supervising Provider    Answer:   Evans Lance [[id number]]    Signed, Richardson Dopp, PA-C  01/22/2018 1:02 PM    [facility] [address], [place], [place]  [address] Phone: [phone]; Fax: [phone]

## 2018-01-22 NOTE — Patient Instructions (Addendum)
Medication Instructions:  Decrease Metoprolol Tartrate to 25 mg take 1/2 tablet (12.5 mg) Twice daily  If you have any more dizzy spells on the lower dose Metoprolol, you can stop taking it altogether.  Labwork: None   Testing/Procedures: None   Follow-Up: Richardson Dopp, PA-C Feb 26, 2018 @ 11:45 AM    Any Other Special Instructions Will Be Listed Below (If Applicable).  If you end up stopping the Metoprolol because you have more dizzy spells, call our office.  I will have you wear a heart monitor if we end up stopping the medication.    If you need a refill on your cardiac medications before your next appointment, please call your pharmacy.

## 2018-01-27 ENCOUNTER — Telehealth: Payer: Self-pay | Admitting: Cardiovascular Disease

## 2018-01-27 NOTE — Telephone Encounter (Signed)
New Message:     Pt is currently at urgent care and pt states he has been diagnosed with a real bad ear infection. Pt was advised to ask Dr. Burt Knack before starting to take Amoxicillin for the ear infection.

## 2018-01-27 NOTE — Telephone Encounter (Signed)
It is fine for pt to take amoxicillin for ear infection, there is no issue with his valve.

## 2018-01-27 NOTE — Telephone Encounter (Signed)
Informed DPR it is fine for patient to take amoxicillin for ear infection. She was grateful for call.

## 2018-02-15 ENCOUNTER — Ambulatory Visit: Payer: BLUE CROSS/BLUE SHIELD | Admitting: Thoracic Surgery (Cardiothoracic Vascular Surgery)

## 2018-02-15 ENCOUNTER — Other Ambulatory Visit: Payer: Self-pay

## 2018-02-15 ENCOUNTER — Telehealth: Payer: Self-pay | Admitting: *Deleted

## 2018-02-15 ENCOUNTER — Encounter: Payer: Self-pay | Admitting: Thoracic Surgery (Cardiothoracic Vascular Surgery)

## 2018-02-15 VITALS — BP 116/67 | HR 70 | Resp 16 | Ht 65.0 in | Wt 165.0 lb

## 2018-02-15 DIAGNOSIS — Z953 Presence of xenogenic heart valve: Secondary | ICD-10-CM

## 2018-02-15 DIAGNOSIS — Z951 Presence of aortocoronary bypass graft: Secondary | ICD-10-CM

## 2018-02-15 NOTE — Telephone Encounter (Signed)
DPR ok to s/w pt's wife Tailor Westfall. I called to see if possibly if pt would be able to change appt from 02/26/18 @ 11:45 to 02/24/18 @ 11:45 due to scheduling error. Pt wife asked if they could still have a Friday. Pt's wife said they will be willing to come in 03/05/18 @ 11:45. I thanked her for her help and apologized for any inconvenience.

## 2018-02-15 NOTE — Progress Notes (Signed)
[name]     CARDIOTHORACIC SURGERY OFFICE NOTE  Referring Provider is James Mocha, MD PCP is James Redwood, MD   HPI:  Patient is a 57 year old male with history of bicuspid aortic valve, coronary artery disease, hypertension, celiac disease, and hyperlipidemia who returns to the office today for routine follow-up status post aortic valve replacement using stented bioprosthetic tissue valve and coronary artery bypass grafting on 08/25/2017.  Patient's postoperative recovery was uneventful.  Follow-up echocardiogram performed September 24, 2017 revealed normal left ventricular systolic function with ejection fraction estimated 60 to 65%.  The bioprosthetic tissue valve in aortic position was functioning normally.  Peak velocity across aortic valve was reported 2.1 m/s corresponding to mean transvalvular gradient estimated 10 mmHg.  There was no aortic insufficiency.  He was last seen here in our office on November 17, 2017 at which time he was doing well.  Since then he has been seen in follow-up by James Wilkins at [facility] on January 22, 2018.  At the time he was having some mild intermittent dizzy spells, and his dose of metoprolol was cut in half.  He returns to our office today and reports that he feels fine.  He states that he has not been having any more dizzy spells since he cut back his dose of metoprolol.  He is back at work and reports normal physical activity without significant limitations.  Overall he is pleased with his progress.    Current Outpatient Medications  Medication Sig Dispense Refill  . aspirin EC 81 MG tablet Take 81 mg by mouth daily.    Marland Kitchen levothyroxine (SYNTHROID, LEVOTHROID) 100 MCG tablet Take 100 mcg by mouth daily before breakfast.    . lisinopril (PRINIVIL,ZESTRIL) 10 MG tablet Take 10 mg by mouth daily.    . metoprolol tartrate (LOPRESSOR) 25 MG tablet Take 0.5 tablets (12.5  mg total) by mouth 2 (two) times daily. 30 tablet 11  . Multiple Vitamins-Minerals (MULTIVITAMIN WITH MINERALS) tablet Take 1 tablet by mouth daily.    . Probiotic Product (PROBIOTIC PO) Take 1 capsule by mouth daily.    . simvastatin (ZOCOR) 20 MG tablet Take 20 mg by mouth at bedtime.     No current facility-administered medications for this visit.       Physical Exam:   BP 116/67 (BP Location: Left Arm, Patient Position: Sitting, Cuff Size: Normal)   Pulse 70   Resp 16   Ht 5' 5"  (1.651 m)   Wt 165 lb (74.8 kg)   SpO2 98% Comment: RA  BMI 27.46 kg/m   General:  Well-appearing  Chest:   Clear to auscultation  CV:   Regular rate and rhythm  Incisions:  Completely healed, sternum stable  Abdomen:  Soft nontender  Extremities:  Warm and well-perfused  Diagnostic Tests:  Transthoracic Echocardiography  Patient:    James, Wilkins MR #:       [id number] Study Date: 09/24/2017 Gender:     M Age:        80 Height:     165.1 cm Weight:     73.5 kg BSA:        1.85 m^2 Pt. Status: Room:   ATTENDING    James Mocha, MD  [facility]     James Mocha, MD  REFERRING    James Mocha, MD  SONOGRAPHER  James Wilkins, RDCS  PERFORMING   [facility], Outpatient  cc:  ------------------------------------------------------------------- LV EF: 60% -   65%  ------------------------------------------------------------------- Indications:      S/p AVR (Z95.2).  ------------------------------------------------------------------- History:   PMH:   Coronary artery disease.  Aortic valve disease. Risk factors:  Dyslipidemia. Bicuspid aortic valve.  ------------------------------------------------------------------- Study Conclusions  - Left ventricle: The cavity size was normal. Wall thickness was   normal. Systolic function was normal. The estimated ejection   fraction was in the range of 60% to 65%. Wall motion was normal;   there were no regional wall motion abnormalities.  Features are   consistent with a pseudonormal left ventricular filling pattern,   with concomitant abnormal relaxation and increased filling   pressure (grade 2 diastolic dysfunction). - Ventricular septum: Septal motion showed abnormal function and   dyssynergy. - Aortic valve: A bioprosthesis was present. Mean gradient (S): 10   mm Hg. Peak gradient (S): 18 mm Hg. - Mitral valve: There was mild regurgitation. - Right ventricle: The cavity size was mildly dilated.  Impressions:  - Normal LV systolic function; moderate diastolic dysfunction; s/p   AVR with mean gradient 10 mmHg; no AI; mild MR; mild RVE.  ------------------------------------------------------------------- Labs, prior tests, procedures, and surgery: Transthoracic echocardiography (07/10/2017).     EF was 55% and PA pressure was 30 (systolic). Aortic valve: peak gradient of 75 mm Hg and mean gradient of 45 mm Hg.  Coronary artery bypass grafting. Valve surgery (08/25/2017).     Aortic valve replacement with a bioprosthetic valve. 25 mm Edwards Intuity Elite bovine pericardial stented rapid deployment valve.  ------------------------------------------------------------------- Study data:  Comparison was made to the study of 07/10/2017.  Study status:  Routine.  Procedure:  The patient reported no pain pre or post test. Transthoracic echocardiography. Image quality was adequate.  Study completion:  There were no complications. Transthoracic echocardiography.  M-mode, complete 2D, 3D, spectral Doppler, and color Doppler.  Birthdate:  Patient birthdate: 11-Oct-1960.  Age:  Patient is 57 yr old.  Sex:  Gender: male. BMI: 27 kg/m^2.  Blood pressure:     116/70  Patient status: Outpatient.  Study date:  Study date: 09/24/2017. Study time: 03:34 PM.  Location:  [facility]  3  -------------------------------------------------------------------  ------------------------------------------------------------------- Left ventricle:  The cavity size was normal. Wall thickness was normal. Systolic function was normal. The estimated ejection fraction was in the range of 60% to 65%. Wall motion was normal; there were no regional wall motion abnormalities. Features are consistent with a pseudonormal left ventricular filling pattern, with concomitant abnormal relaxation and increased filling pressure (grade 2 diastolic dysfunction).  ------------------------------------------------------------------- Aortic valve:  A bioprosthesis was present. Mobility was not restricted.  Doppler:  Transvalvular velocity was within the normal range. There was no stenosis. There was no regurgitation.    VTI ratio of LVOT to aortic valve: 0.59. Valve area (VTI): 1.85 cm^2. Indexed valve area (VTI): 1 cm^2/m^2. Peak velocity ratio of LVOT to aortic valve: 0.62. Valve area (Vmax): 1.94 cm^2. Indexed valve area (Vmax): 1.05 cm^2/m^2. Mean velocity ratio of LVOT to aortic valve: 0.56. Valve area (Vmean): 1.76 cm^2. Indexed valve area (Vmean): 0.95 cm^2/m^2.    Mean gradient (S): 10 mm Hg. Peak gradient (S): 18 mm Hg.  ------------------------------------------------------------------- Aorta:  Aortic root: The aortic root was normal in size.  ------------------------------------------------------------------- Mitral valve:   Mildly thickened leaflets . Mobility was not restricted.  Doppler:  Transvalvular velocity was within the normal range. There was no  evidence for stenosis. There was mild regurgitation.    Peak gradient (D): 7 mm Hg.  ------------------------------------------------------------------- Left atrium:  The atrium was normal in size.  ------------------------------------------------------------------- Right ventricle:  The cavity size was mildly dilated.  Systolic function was normal.  ------------------------------------------------------------------- Ventricular septum:   Septal motion showed abnormal function and dyssynergy.  ------------------------------------------------------------------- Pulmonic valve:    Doppler:  Transvalvular velocity was within the normal range. There was no evidence for stenosis.  ------------------------------------------------------------------- Tricuspid valve:   Structurally normal valve.    Doppler: Transvalvular velocity was within the normal range. There was trivial regurgitation.  ------------------------------------------------------------------- Right atrium:  The atrium was normal in size.  ------------------------------------------------------------------- Pericardium:  There was no pericardial effusion.  ------------------------------------------------------------------- Systemic veins: Inferior vena cava: The vessel was normal in size.  ------------------------------------------------------------------- Measurements   Left ventricle                            Value          Reference  LV ID, ED, PLAX chordal                   43.1  mm       43 - 52  LV ID, ES, PLAX chordal                   31.2  mm       23 - 38  LV fx shortening, PLAX chordal    (L)     28    %        >=29  LV PW thickness, ED                       10.4  mm       ---------  IVS/LV PW ratio, ED                       0.97           <=1.3  Stroke volume, 2D                         88    ml       ---------  Stroke volume/bsa, 2D                     47    ml/m^2   ---------  LV e&', lateral                            9.48  cm/s     ---------  LV E/e&', lateral                          13.92          ---------  LV e&', medial                             5.02  cm/s     ---------  LV E/e&', medial                           26.29          ---------  LV e&', average  7.25  cm/s      ---------  LV E/e&', average                          18.21          ---------    Ventricular septum                        Value          Reference  IVS thickness, ED                         10.1  mm       ---------    LVOT                                      Value          Reference  LVOT ID, S                                20    mm       ---------  LVOT area                                 3.14  cm^2     ---------  LVOT peak velocity, S                     132   cm/s     ---------  LVOT mean velocity, S                     83.3  cm/s     ---------  LVOT VTI, S                               28    cm       ---------  LVOT peak gradient, S                     7     mm Hg    ---------    Aortic valve                              Value          Reference  Aortic valve peak velocity, S             214   cm/s     ---------  Aortic valve mean velocity, S             149   cm/s     ---------  Aortic valve VTI, S                       47.5  cm       ---------  Aortic mean gradient, S                   10    mm Hg    ---------  Aortic peak gradient, S  18    mm Hg    ---------  VTI ratio, LVOT/AV                        0.59           ---------  Aortic valve area, VTI                    1.85  cm^2     ---------  Aortic valve area/bsa, VTI                1     cm^2/m^2 ---------  Velocity ratio, peak, LVOT/AV             0.62           ---------  Aortic valve area, peak velocity          1.94  cm^2     ---------  Aortic valve area/bsa, peak               1.05  cm^2/m^2 ---------  velocity  Velocity ratio, mean, LVOT/AV             0.56           ---------  Aortic valve area, mean velocity          1.76  cm^2     ---------  Aortic valve area/bsa, mean               0.95  cm^2/m^2 ---------  velocity    Aorta                                     Value          Reference  Aortic root ID, ED                        32    mm       ---------  Ascending aorta ID, A-P, S                 37    mm       ---------    Left atrium                               Value          Reference  LA ID, A-P, ES                            39    mm       ---------  LA ID/bsa, A-P                            2.1   cm/m^2   <=2.2  LA volume, S                              40.1  ml       ---------  LA volume/bsa, S                          21.6  ml/m^2   ---------  LA volume, ES, 1-p A4C  35.7  ml       ---------  LA volume/bsa, ES, 1-p A4C                19.3  ml/m^2   ---------  LA volume, ES, 1-p A2C                    43.8  ml       ---------  LA volume/bsa, ES, 1-p A2C                23.6  ml/m^2   ---------    Mitral valve                              Value          Reference  Mitral E-wave peak velocity               132   cm/s     ---------  Mitral A-wave peak velocity               73.5  cm/s     ---------  Mitral deceleration time                  206   ms       150 - 230  Mitral peak gradient, D                   7     mm Hg    ---------  Mitral E/A ratio, peak                    1.8            ---------    Systemic veins                            Value          Reference  Estimated CVP                             3     mm Hg    ---------    Right ventricle                           Value          Reference  TAPSE                                     15.1  mm       ---------  RV s&', lateral, S                         8.73  cm/s     ---------  Legend: (L)  and  (H)  mark values outside specified reference range.  ------------------------------------------------------------------- Prepared and Electronically Authenticated by  Kirk Ruths 2018-12-20T16:39:34      Impression:  Patient is doing well approximately 6 months status post aortic valve replacement using a bioprosthetic tissue valve and coronary artery bypass grafting.  Plan:  We have not recommended any change the patient's current medications.  The patient may resume unrestricted  physical activity.  The patient has been reminded regarding the  importance of dental hygiene and the lifelong need for antibiotic prophylaxis for all dental cleanings and other related invasive procedures.  He will return to our office in approximately 6 months for routine follow-up.  He will call and return sooner should specific problems or questions arise.  I spent in excess of 15 minutes during the conduct of this office consultation and >50% of this time involved direct face-to-face encounter with the patient for counseling and/or coordination of their care.    Valentina Gu. Roxy Manns, MD 02/15/2018 4:24 PM

## 2018-02-15 NOTE — Patient Instructions (Signed)
Continue all previous medications without any changes at this time  You may resume unrestricted physical activity without any particular limitations at this time.  Endocarditis is a potentially serious infection of heart valves or inside lining of the heart.  It occurs more commonly in patients with diseased heart valves (such as patient's with aortic or mitral valve disease) and in patients who have undergone heart valve repair or replacement.  Certain surgical and dental procedures may put you at risk, such as dental cleaning, other dental procedures, or any surgery involving the respiratory, urinary, gastrointestinal tract, gallbladder or prostate gland.   To minimize your chances for develooping endocarditis, maintain good oral health and seek prompt medical attention for any infections involving the mouth, teeth, gums, skin or urinary tract.    Always notify your doctor or dentist about your underlying heart valve condition before having any invasive procedures. You will need to take antibiotics before certain procedures, including all routine dental cleanings or other dental procedures.  Your cardiologist or dentist should prescribe these antibiotics for you to be taken ahead of time.

## 2018-02-26 ENCOUNTER — Ambulatory Visit: Payer: BLUE CROSS/BLUE SHIELD | Admitting: Physician Assistant

## 2018-03-05 ENCOUNTER — Ambulatory Visit: Payer: BLUE CROSS/BLUE SHIELD | Admitting: Physician Assistant

## 2018-03-05 ENCOUNTER — Encounter: Payer: Self-pay | Admitting: Physician Assistant

## 2018-03-05 VITALS — BP 124/68 | HR 69 | Ht 65.0 in | Wt 166.0 lb

## 2018-03-05 DIAGNOSIS — I2510 Atherosclerotic heart disease of native coronary artery without angina pectoris: Secondary | ICD-10-CM | POA: Diagnosis not present

## 2018-03-05 DIAGNOSIS — R001 Bradycardia, unspecified: Secondary | ICD-10-CM | POA: Diagnosis not present

## 2018-03-05 NOTE — Patient Instructions (Signed)
Medication Instructions:  1. Your physician recommends that you continue on your current medications as directed. Please refer to the Current Medication list given to you today.   Labwork: NONE ORDERED TODAY  Testing/Procedures: NONE ORDERED TODAY  Follow-Up: Your physician wants you to follow-up in: 6 MONTHS WITH DR. Emelda Fear will receive a reminder letter in the mail two months in advance. If you don't receive a letter, please call our office to schedule the follow-up appointment.   Any Other Special Instructions Will Be Listed Below (If Applicable).     If you need a refill on your cardiac medications before your next appointment, please call your pharmacy.

## 2018-03-05 NOTE — Progress Notes (Signed)
Cardiology Office Note:    Date:  03/05/2018   ID:  LYAL HUSTED, DOB 1961/06/04, MRN [id number]  PCP:  Marton Redwood, MD  Cardiologist:  [name], MD  Cardiothoracic surgeon: Darylene Price, MD  Referring MD: Marton Redwood, MD   Chief Complaint  Patient presents with  . Follow-up    Bradycardia    History of Present Illness:    James Wilkins is a 57 y.o. male with coronary artery disease and aortic stenosis status post CABG + bioprosthetic AVR 08/2017.  He was last seen 01/22/2018.  He complained of frequent episodes of dizziness and near syncope.  I reduced his beta-blocker dose.     James Wilkins returns for follow-up.  He is here alone.  Since reducing the dose of his metoprolol, he has felt much better.  He has not had any further dizzy spells or near syncope.  He is working full-time without any problems.  He denies chest pain, shortness of breath, PND, edema.  He does note some fatigue.  Prior CV studies:   The following studies were reviewed today:  Echo 09/24/17 EF 60-65, normal wall motion, grade 2 diastolic dysfunction, normally functioning bioprosthetic AVR with mean gradient 10/peak 18, mild MR  Preoperative Dopplers 08/21/17 Final Interpretation: Right Carotid: There is evidence in the right ICA of a 1-39% stenosis. Left Carotid: There is evidence in the left ICA of a 1-39% stenosis. Vertebrals: Both vertebral arteries were patent with antegrade flow. Right ABI: Resting right ankle-brachial index is within normal range. No evidence of significant right lower extremity arterial disease. Left ABI: Resting left ankle-brachial index is within normal range. No evidence of significant left lower extremity arterial disease.  Cardiac catheterization 08/12/17     Results for orders placed during the hospital encounter of 08/12/17  CARDIAC CATHETERIZATION   Narrative 1.  Severe LAD stenosis (85%) at the level of the first septal perforator 2.  Severe ostial left  circumflex stenosis (90, 80%) 3.  Mild nonobstructive stenosis of the right coronary artery (40%) 4.  Normal right heart hemodynamics 5.  Known severe aortic stenosis  The patient will continue evaluation for CABG/AVR.  Anticipate grafting of  the LAD and large first OM branches.   Echo 07/10/17 EF 55-60, normal wall motion, normal diastolic function, bicuspid aortic valve with severe aortic stenosis (mean 45, peak 75), mildly dilated ascending aorta (38 mm), mild TR, PASP 30   Past Medical History:  Diagnosis Date  . Aortic stenosis   . Arthritis    "joints" (08/27/2017)  . Bicuspid aortic valve   . Celiac disease   . Coronary artery disease involving native coronary artery of native heart with angina pectoris ([place])   . Dilation of esophagus    tortuous esophagus  . Dyspnea   . Gastritis    non erosive  . GERD (gastroesophageal reflux disease)   . Heart murmur    "ever since I was little" (08/27/2017)  . Hemorrhoids   . Hiatal hernia   . HLD (hyperlipidemia)   . Hypertension   . Hypothyroidism   . S/P aortic valve replacement with bioprosthetic valve 08/25/2017   25 mm Edwards Intuity Elite bovine pericardial stented rapid deployment valve    Current Medications: Current Meds  Medication Sig  . aspirin EC 81 MG tablet Take 81 mg by mouth daily.  Marland Kitchen levothyroxine (SYNTHROID, LEVOTHROID) 100 MCG tablet Take 100 mcg by mouth daily before breakfast.  . lisinopril (PRINIVIL,ZESTRIL) 10 MG tablet Take 10 mg  by mouth daily.  . metoprolol tartrate (LOPRESSOR) 25 MG tablet Take 0.5 tablets (12.5 mg total) by mouth 2 (two) times daily.  . simvastatin (ZOCOR) 20 MG tablet Take 20 mg by mouth at bedtime.     Allergies:   Wheat bran   Social History   Tobacco Use  . Smoking status: Never Smoker  . Smokeless tobacco: Never Used  Substance Use Topics  . Alcohol use: No  . Drug use: No     Family Hx: The patient's family history includes Brain cancer in his father; Cancer  in his other; Colon polyps in his sister; Diabetes in his father; Heart disease in his father and mother; Hypertension in his other. There is no history of Colon cancer, Kidney disease, or Esophageal cancer.  ROS:   Please see the history of present illness.    ROS All other systems reviewed and are negative.   EKGs/Labs/Other Test Reviewed:    EKG:  EKG is not ordered today.    Recent Labs: 08/21/2017: ALT 38; B Natriuretic Peptide 11.4 08/26/2017: Magnesium 2.3 08/28/2017: BUN 20; Creatinine, Ser 1.35; Potassium 4.0; Sodium 132 09/17/2017: Hemoglobin 11.4; Platelets 422   Recent Lipid Panel No results found for: CHOL, TRIG, HDL, CHOLHDL, LDLCALC, LDLDIRECT  Physical Exam:    VS:  BP 124/68   Pulse 69   Ht 5' 5"  (1.651 m)   Wt 166 lb (75.3 kg)   BMI 27.62 kg/m     Wt Readings from Last 3 Encounters:  03/05/18 166 lb (75.3 kg)  02/15/18 165 lb (74.8 kg)  01/22/18 167 lb 12.8 oz (76.1 kg)     Physical Exam  Constitutional: He is oriented to person, place, and time. He appears well-developed and well-nourished. No distress.  HENT:  Head: Normocephalic and atraumatic.  Neck: Neck supple. No JVD present.  Cardiovascular: Normal rate, regular rhythm, S1 normal and S2 normal.  Murmur heard.  Low-pitched systolic murmur is present with a grade of 2/6 at the upper right sternal border. Pulmonary/Chest: Breath sounds normal. He has no rales.  Abdominal: Soft. There is no hepatomegaly.  Musculoskeletal: He exhibits no edema.  Neurological: He is alert and oriented to person, place, and time.  Skin: Skin is warm and dry.    ASSESSMENT & PLAN:    Bradycardia He was previously symptomatic with bradycardia.  His heart rate is improved on lower dose beta-blocker.  He has not had any further symptoms.  Continue current medical regimen.  Coronary artery disease involving native coronary artery of native heart without angina pectoris Status post CABG + AVR.  He is doing well  without anginal symptoms.  Continue SBE prophylaxis.  Continue aspirin, beta-blocker, ACE inhibitor, statin.   Dispo:  Return in about 6 months (around 09/04/2018) for Routine Follow Up, w/ Dr. Burt Knack.   Medication Adjustments/Labs and Tests Ordered: Current medicines are reviewed at length with the patient today.  Concerns regarding medicines are outlined above.  Tests Ordered: No orders of the defined types were placed in this encounter.  Medication Changes: No orders of the defined types were placed in this encounter.   Signed, Richardson Dopp, PA-C  03/05/2018 12:23 PM    [facility] [place], [place], [place]  [address] Phone: [phone]; Fax: [phone]

## 2018-05-10 ENCOUNTER — Encounter: Payer: Self-pay | Admitting: Cardiovascular Disease

## 2018-06-11 DIAGNOSIS — E7849 Other hyperlipidemia: Secondary | ICD-10-CM | POA: Diagnosis not present

## 2018-06-11 DIAGNOSIS — R82998 Other abnormal findings in urine: Secondary | ICD-10-CM | POA: Diagnosis not present

## 2018-06-11 DIAGNOSIS — I10 Essential (primary) hypertension: Secondary | ICD-10-CM | POA: Diagnosis not present

## 2018-06-11 DIAGNOSIS — Z125 Encounter for screening for malignant neoplasm of prostate: Secondary | ICD-10-CM | POA: Diagnosis not present

## 2018-06-11 DIAGNOSIS — E291 Testicular hypofunction: Secondary | ICD-10-CM | POA: Diagnosis not present

## 2018-06-11 DIAGNOSIS — E038 Other specified hypothyroidism: Secondary | ICD-10-CM | POA: Diagnosis not present

## 2018-06-25 DIAGNOSIS — I25810 Atherosclerosis of coronary artery bypass graft(s) without angina pectoris: Secondary | ICD-10-CM | POA: Diagnosis not present

## 2018-06-25 DIAGNOSIS — E7849 Other hyperlipidemia: Secondary | ICD-10-CM | POA: Diagnosis not present

## 2018-06-25 DIAGNOSIS — E038 Other specified hypothyroidism: Secondary | ICD-10-CM | POA: Diagnosis not present

## 2018-06-25 DIAGNOSIS — Z23 Encounter for immunization: Secondary | ICD-10-CM | POA: Diagnosis not present

## 2018-06-25 DIAGNOSIS — I2510 Atherosclerotic heart disease of native coronary artery without angina pectoris: Secondary | ICD-10-CM | POA: Insufficient documentation

## 2018-06-25 DIAGNOSIS — Z0000 Encounter for general adult medical examination without abnormal findings: Secondary | ICD-10-CM | POA: Diagnosis not present

## 2018-06-25 DIAGNOSIS — I10 Essential (primary) hypertension: Secondary | ICD-10-CM | POA: Diagnosis not present

## 2018-06-28 ENCOUNTER — Encounter: Payer: Self-pay | Admitting: Internal Medicine

## 2018-07-02 ENCOUNTER — Encounter (HOSPITAL_COMMUNITY): Payer: Self-pay | Admitting: Emergency Medicine

## 2018-07-02 ENCOUNTER — Emergency Department (HOSPITAL_COMMUNITY)
Admission: EM | Admit: 2018-07-02 | Discharge: 2018-07-02 | Disposition: A | Discharge status: Home / Self Care | Payer: BLUE CROSS/BLUE SHIELD | Attending: Emergency Medicine | Admitting: Emergency Medicine

## 2018-07-02 ENCOUNTER — Emergency Department (HOSPITAL_COMMUNITY): Payer: BLUE CROSS/BLUE SHIELD

## 2018-07-02 DIAGNOSIS — E039 Hypothyroidism, unspecified: Secondary | ICD-10-CM | POA: Insufficient documentation

## 2018-07-02 DIAGNOSIS — I10 Essential (primary) hypertension: Secondary | ICD-10-CM | POA: Diagnosis not present

## 2018-07-02 DIAGNOSIS — Z7982 Long term (current) use of aspirin: Secondary | ICD-10-CM | POA: Diagnosis not present

## 2018-07-02 DIAGNOSIS — I2510 Atherosclerotic heart disease of native coronary artery without angina pectoris: Secondary | ICD-10-CM | POA: Diagnosis not present

## 2018-07-02 DIAGNOSIS — R0789 Other chest pain: Secondary | ICD-10-CM | POA: Diagnosis not present

## 2018-07-02 DIAGNOSIS — Z79899 Other long term (current) drug therapy: Secondary | ICD-10-CM | POA: Insufficient documentation

## 2018-07-02 DIAGNOSIS — R079 Chest pain, unspecified: Secondary | ICD-10-CM | POA: Diagnosis not present

## 2018-07-02 LAB — BASIC METABOLIC PANEL
Anion gap: 9 (ref 5–15)
BUN: 14 mg/dL (ref 6–20)
CHLORIDE: 104 mmol/L (ref 98–111)
CO2: 27 mmol/L (ref 22–32)
Calcium: 9 mg/dL (ref 8.9–10.3)
Creatinine, Ser: 1.44 mg/dL — ABNORMAL HIGH (ref 0.61–1.24)
GFR calc non Af Amer: 53 mL/min — ABNORMAL LOW (ref >60)
Glucose, Bld: 108 mg/dL — ABNORMAL HIGH (ref 70–99)
Potassium: 3.8 mmol/L (ref 3.5–5.1)
SODIUM: 140 mmol/L (ref 135–145)

## 2018-07-02 LAB — CBC
HEMATOCRIT: 41.2 % (ref 39.0–52.0)
Hemoglobin: 13 g/dL (ref 13.0–17.0)
MCH: 28.4 pg (ref 26.0–34.0)
MCHC: 31.6 g/dL (ref 30.0–36.0)
MCV: 90.2 fL (ref 78.0–100.0)
PLATELETS: 181 10*3/uL (ref 150–400)
RBC: 4.57 MIL/uL (ref 4.22–5.81)
RDW: 13.5 % (ref 11.5–15.5)
WBC: 7.8 10*3/uL (ref 4.0–10.5)

## 2018-07-02 LAB — I-STAT TROPONIN, ED
TROPONIN I, POC: 0 ng/mL (ref 0.00–0.08)
Troponin i, poc: 0 ng/mL (ref 0.00–0.08)

## 2018-07-02 LAB — PROTIME-INR
INR: 0.98
PROTHROMBIN TIME: 12.8 s (ref 11.4–15.2)

## 2018-07-02 LAB — D-DIMER, QUANTITATIVE: D-Dimer, Quant: 0.55 ug/mL-FEU — ABNORMAL HIGH (ref 0.00–0.50)

## 2018-07-02 MED ORDER — KETOROLAC TROMETHAMINE 30 MG/ML IJ SOLN
30.0000 mg | Freq: Once | INTRAMUSCULAR | Status: AC
  Administered 2018-07-02: 30 mg via INTRAVENOUS
  Filled 2018-07-02: qty 1

## 2018-07-02 NOTE — ED Triage Notes (Signed)
Patient reports central chest pain with with mild SOB and occasional dry cough onset yesterday , pain increases with deep inspiration , history of CAD/CABG his cardiologist is Dr. Burt Knack.

## 2018-07-02 NOTE — ED Provider Notes (Signed)
TIME SEEN: 4:42 AM  CHIEF COMPLAINT: Chest pain  HPI: Patient is a 57 year old male with history of hypertension, hyperlipidemia, hypothyroidism, celiac disease, CAD status post CABG in November 2018 who presents to the emergency department with complaints of chest pain.  States chest pain has been present since yesterday evening.  Started after he was moving metal pieces into the back of his truck.  Pain is worse with deep inspiration and movement of the right arm.  No associated shortness of breath, nausea or vomiting, diaphoresis, dizziness.  Has had chills and cough however.  Cough is been nonproductive.  No fever.  ROS: See HPI Constitutional: no fever  Eyes: no drainage  ENT: no runny nose   Cardiovascular:   chest pain  Resp: no SOB  GI: no vomiting GU: no dysuria Integumentary: no rash  Allergy: no hives  Musculoskeletal: no leg swelling  Neurological: no slurred speech ROS otherwise negative  PAST MEDICAL HISTORY/PAST SURGICAL HISTORY:  Past Medical History:  Diagnosis Date  . Aortic stenosis   . Arthritis    "joints" (08/27/2017)  . Bicuspid aortic valve   . Celiac disease   . Coronary artery disease involving native coronary artery of native heart with angina pectoris ([place])   . Dilation of esophagus    tortuous esophagus  . Dyspnea   . Gastritis    non erosive  . GERD (gastroesophageal reflux disease)   . Heart murmur    "ever since I was little" (08/27/2017)  . Hemorrhoids   . Hiatal hernia   . HLD (hyperlipidemia)   . Hypertension   . Hypothyroidism   . S/P aortic valve replacement with bioprosthetic valve 08/25/2017   25 mm Edwards Intuity Elite bovine pericardial stented rapid deployment valve    MEDICATIONS:  Prior to Admission medications   Medication Sig Start Date End Date Taking? Authorizing Provider  aspirin EC 81 MG tablet Take 81 mg by mouth daily.    [provider]  levothyroxine (SYNTHROID, LEVOTHROID) 100 MCG tablet Take 100 mcg by  mouth daily before breakfast.    [provider]  lisinopril (PRINIVIL,ZESTRIL) 10 MG tablet Take 10 mg by mouth daily.    [provider]  metoprolol tartrate (LOPRESSOR) 25 MG tablet Take 0.5 tablets (12.5 mg total) by mouth 2 (two) times daily. 01/22/18 01/17/19  Richardson Dopp T, PA-C  simvastatin (ZOCOR) 20 MG tablet Take 20 mg by mouth at bedtime.    [provider]    ALLERGIES:  Allergies  Allergen Reactions  . Wheat Bran Diarrhea    Barley,rye    SOCIAL HISTORY:  Social History   Tobacco Use  . Smoking status: Never Smoker  . Smokeless tobacco: Never Used  Substance Use Topics  . Alcohol use: No    FAMILY HISTORY: Family History  Problem Relation Age of Onset  . Diabetes Father        Type II  . Brain cancer Father   . Heart disease Father   . Heart disease Mother   . Colon polyps Sister   . Cancer Other   . Hypertension Other   . Colon cancer Neg Hx   . Kidney disease Neg Hx   . Esophageal cancer Neg Hx     EXAM: BP (!) 148/82 (BP Location: Right Arm)   Pulse 73   Temp 99.9 F (37.7 C) (Oral)   Resp 18   SpO2 100%  CONSTITUTIONAL: Alert and oriented and responds appropriately to questions. Well-appearing; well-nourished HEAD: Normocephalic  EYES: Conjunctivae clear, pupils appear equal, EOMI ENT: normal nose; moist mucous membranes NECK: Supple, no meningismus, no nuchal rigidity, no LAD  CARD: RRR; S1 and S2 appreciated; no murmurs, no clicks, no rubs, no gallops CHEST:  Chest wall is tender to palpation just right of the sternum which reproduces pain.  No crepitus, ecchymosis, erythema, warmth, rash or other lesions present.   RESP: Normal chest excursion without splinting or tachypnea; breath sounds clear and equal bilaterally; no wheezes, no rhonchi, no rales, no hypoxia or respiratory distress, speaking full sentences ABD/GI: Normal bowel sounds; non-distended; soft, non-tender, no rebound, no guarding, no peritoneal signs,  no hepatosplenomegaly BACK:  The back appears normal and is non-tender to palpation, there is no CVA tenderness EXT: Normal ROM in all joints; non-tender to palpation; no edema; normal capillary refill; no cyanosis, no calf tenderness or swelling    SKIN: Normal color for age and race; warm; no rash NEURO: Moves all extremities equally PSYCH: The patient's mood and manner are appropriate. Grooming and personal hygiene are appropriate.  MEDICAL DECISION MAKING: Patient here with chest pain.  Seems to be musculoskeletal in nature but does have a low-grade temperature with suggest this could be pneumonia or pulmonary embolus.  Low risk for PE.  Will obtain d-dimer.  First troponin negative.  Will obtain second troponin in 3 hours but I feel this is very unlikely that this is ACS.  EKG shows no new ischemic abnormality.  Chest x-ray pending.  Doubt dissection.  ED PROGRESS: Patient's age-adjusted d-dimer is negative.  Second troponin pending at 6:15 AM.  Chest x-ray shows congestion without overt edema.  No pneumonia.  Patient second troponin is negative.  I feel safe with discharging him home.  He is also comfortable with this plan.  Recommended alternating Tylenol and Motrin for pain at home.   At this time, I do not feel there is any life-threatening condition present. I have reviewed and discussed all results (EKG, imaging, lab, urine as appropriate) and exam findings with patient/family. I have reviewed nursing notes and appropriate previous records.  I feel the patient is safe to be discharged home without further emergent workup and can continue workup as an outpatient as needed. Discussed usual and customary return precautions. Patient/family verbalize understanding and are comfortable with this plan.  Outpatient follow-up has been provided if needed. All questions have been answered.    EKG Interpretation  Date/Time:  Friday July 02 2018 03:05:18 EDT Ventricular Rate:  71 PR  Interval:  184 QRS Duration: 152 QT Interval:  402 QTC Calculation: 436 R Axis:   64 Text Interpretation:  Normal sinus rhythm Possible Left atrial enlargement Left bundle branch block Abnormal ECG No significant change since last tracing Confirmed by Siris Hoos, Cyril Mourning [phone]) on 07/02/2018 4:42:21 AM         Dayani Winbush, Delice Bison, DO 07/02/18 [id number]

## 2018-07-02 NOTE — Discharge Instructions (Signed)
You may alternate Tylenol 1000 mg every 6 hours as needed for pain and Ibuprofen 800 mg every 8 hours as needed for pain.  Please take Ibuprofen with food. ° °

## 2018-07-30 ENCOUNTER — Ambulatory Visit (AMBULATORY_SURGERY_CENTER): Payer: Self-pay

## 2018-07-30 VITALS — Ht 66.0 in | Wt 176.2 lb

## 2018-07-30 DIAGNOSIS — Z1211 Encounter for screening for malignant neoplasm of colon: Secondary | ICD-10-CM

## 2018-07-30 NOTE — Progress Notes (Signed)
No egg or soy allergy known to patient  No issues with past sedation with any surgeries  or procedures, no intubation problems  No diet pills per patient No home 02 use per patient  No blood thinners per patient  Pt denies issues with constipation  No A fib or A flutter  EMMI video sent to pt's e mail . Pt declined

## 2018-08-03 ENCOUNTER — Encounter: Payer: Self-pay | Admitting: Internal Medicine

## 2018-08-13 ENCOUNTER — Encounter: Payer: Self-pay | Admitting: Internal Medicine

## 2018-08-13 ENCOUNTER — Ambulatory Visit (AMBULATORY_SURGERY_CENTER): Payer: BLUE CROSS/BLUE SHIELD | Admitting: Internal Medicine

## 2018-08-13 VITALS — BP 116/70 | HR 59 | Temp 98.2°F | Resp 7 | Ht 66.0 in | Wt 176.0 lb

## 2018-08-13 DIAGNOSIS — Z1211 Encounter for screening for malignant neoplasm of colon: Secondary | ICD-10-CM

## 2018-08-13 MED ORDER — SODIUM CHLORIDE 0.9 % IV SOLN: 500.0000 mL | Freq: Once | INTRAVENOUS | Status: DC

## 2018-08-13 NOTE — Progress Notes (Signed)
Pt's states no medical or surgical changes since previsit or office visit. 

## 2018-08-13 NOTE — Op Note (Signed)
[facility] Patient Name: James Wilkins Procedure Date: 08/13/2018 12:00 PM MRN: [id number] Endoscopist: Gatha Mayer , MD Age: 57 Referring MD:  Date of Birth: 03-22-1961 Gender: Male Account #: [id number] Procedure:                Colonoscopy Indications:              Screening for colorectal malignant neoplasm Medicines:                Propofol per Anesthesia, Monitored Anesthesia Care Procedure:                Pre-Anesthesia Assessment:                           - Prior to the procedure, a History and Physical                            was performed, and patient medications and                            allergies were reviewed. The patient's tolerance of                            previous anesthesia was also reviewed. The risks                            and benefits of the procedure and the sedation                            options and risks were discussed with the patient.                            All questions were answered, and informed consent                            was obtained. Prior Anticoagulants: The patient has                            taken no previous anticoagulant or antiplatelet                            agents. ASA Grade Assessment: II - A patient with                            mild systemic disease. After reviewing the risks                            and benefits, the patient was deemed in                            satisfactory condition to undergo the procedure.                           After obtaining informed consent, the colonoscope  was passed under direct vision. Throughout the                            procedure, the patient's blood pressure, pulse, and                            oxygen saturations were monitored continuously. The                            Colonoscope was introduced through the anus and                            advanced to the the cecum, identified by   appendiceal orifice and ileocecal valve. The                            quality of the bowel preparation was good. The                            colonoscopy was performed without difficulty. The                            patient tolerated the procedure well. The bowel                            preparation used was Miralax. Scope In: 12:19:26 PM Scope Out: 12:34:20 PM Scope Withdrawal Time: 0 hours 10 minutes 13 seconds  Total Procedure Duration: 0 hours 14 minutes 54 seconds  Findings:                 The perianal and digital rectal examinations were                            normal. Pertinent negatives include normal prostate                            (size, shape, and consistency).                           The colon (entire examined portion) appeared normal.                           No additional abnormalities were found on                            retroflexion. Complications:            No immediate complications. Estimated blood loss:                            None. Estimated Blood Loss:     Estimated blood loss: none. Impression:               - The entire examined colon is normal.                           - No specimens  collected. Recommendation:           - Repeat colonoscopy in 10 years for screening                            purposes.                           - Patient has a contact number available for                            emergencies. The signs and symptoms of potential                            delayed complications were discussed with the                            patient. Return to normal activities tomorrow.                            Written discharge instructions were provided to the                            patient.                           - Resume previous diet.                           - Continue present medications. Gatha Mayer, MD 08/13/2018 12:40:07 PM This report has been signed electronically.

## 2018-08-13 NOTE — Patient Instructions (Addendum)
   No polyps or cancer seen. You do have internal hemorrhoids. Next routine colonoscopy or other screening test in 10 years - 2029  I appreciate the opportunity to care for you. Gatha Mayer, MD, FACG   YOU HAD AN ENDOSCOPIC PROCEDURE TODAY AT [facility]:   Refer to the procedure report that was given to you for any specific questions about what was found during the examination.  If the procedure report does not answer your questions, please call your gastroenterologist to clarify.  If you requested that your care partner not be given the details of your procedure findings, then the procedure report has been included in a sealed envelope for you to review at your convenience later.  YOU SHOULD EXPECT: Some feelings of bloating in the abdomen. Passage of more gas than usual.  Walking can help get rid of the air that was put into your GI tract during the procedure and reduce the bloating. If you had a lower endoscopy (such as a colonoscopy or flexible sigmoidoscopy) you may notice spotting of blood in your stool or on the toilet paper. If you underwent a bowel prep for your procedure, you may not have a normal bowel movement for a few days.  Please Note:  You might notice some irritation and congestion in your nose or some drainage.  This is from the oxygen used during your procedure.  There is no need for concern and it should clear up in a day or so.  SYMPTOMS TO REPORT IMMEDIATELY:   Following lower endoscopy (colonoscopy or flexible sigmoidoscopy):  Excessive amounts of blood in the stool  Significant tenderness or worsening of abdominal pains  Swelling of the abdomen that is new, acute  Fever of 100F or higher   For urgent or emergent issues, a gastroenterologist can be reached at any hour by calling [phone].   DIET:  We do recommend a small meal at first, but then you may proceed to your regular diet.  Drink plenty of fluids but you should avoid  alcoholic beverages for 24 hours.  ACTIVITY:  You should plan to take it easy for the rest of today and you should NOT DRIVE or use heavy machinery until tomorrow (because of the sedation medicines used during the test).    FOLLOW UP: Our staff will call the number listed on your records the next business day following your procedure to check on you and address any questions or concerns that you may have regarding the information given to you following your procedure. If we do not reach you, we will leave a message.  However, if you are feeling well and you are not experiencing any problems, there is no need to return our call.  We will assume that you have returned to your regular daily activities without incident.  If any biopsies were taken you will be contacted by phone or by letter within the next 1-3 weeks.  Please call us at [phone] if you have not heard about the biopsies in 3 weeks.    SIGNATURES/CONFIDENTIALITY: You and/or your care partner have signed paperwork which will be entered into your electronic medical record.  These signatures attest to the fact that that the information above on your After Visit Summary has been reviewed and is understood.  Full responsibility of the confidentiality of this discharge information lies with you and/or your care-partner.  Read all handouts given to you by your recovery room nurse.

## 2018-08-13 NOTE — Progress Notes (Signed)
A/ox3 pleased with MAC, report to RN 

## 2018-08-16 ENCOUNTER — Telehealth: Payer: Self-pay

## 2018-08-16 ENCOUNTER — Other Ambulatory Visit: Payer: Self-pay

## 2018-08-16 ENCOUNTER — Ambulatory Visit: Payer: BLUE CROSS/BLUE SHIELD | Admitting: Thoracic Surgery (Cardiothoracic Vascular Surgery)

## 2018-08-16 ENCOUNTER — Encounter: Payer: Self-pay | Admitting: Thoracic Surgery (Cardiothoracic Vascular Surgery)

## 2018-08-16 VITALS — BP 132/76 | HR 62 | Ht 66.0 in | Wt 176.0 lb

## 2018-08-16 DIAGNOSIS — Z953 Presence of xenogenic heart valve: Secondary | ICD-10-CM | POA: Diagnosis not present

## 2018-08-16 DIAGNOSIS — Z951 Presence of aortocoronary bypass graft: Secondary | ICD-10-CM | POA: Diagnosis not present

## 2018-08-16 NOTE — Progress Notes (Signed)
      [address]       [place] [address]             [phone]     CARDIOTHORACIC SURGERY OFFICE NOTE  Referring Provider is [name], MD PCP is Marton Redwood, MD   HPI:  Patient is a 57 year old male with history of bicuspid aortic valve, coronary artery disease, hypertension, celiac disease, and hyperlipidemia who returns to the office today for routine follow-up approximately 1 year status post aortic valve replacement using stented bioprosthetic tissue valve and coronary artery bypass grafting on 08/25/2017.  He was last seen in our office on Feb 15, 2018 at which time he was doing well.  Shortly after that he was seen in follow-up by Richardson Dopp on Mar 05, 2018.  He returns for office today and reports that he is doing exceptionally well.  He is back at work and he has no physical limitations whatsoever.  Overall he states that he feels "much improved" in comparison with how he felt prior to surgery.  He now is doing quite strenuous activities as part of his routine job, and he no longer gets any type of exertional chest pain or shortness of breath.  He reports no physical limitations at all.   Current Outpatient Medications  Medication Sig Dispense Refill  . aspirin EC 81 MG tablet Take 81 mg by mouth daily.    Marland Kitchen levothyroxine (SYNTHROID, LEVOTHROID) 88 MCG tablet Take 88 mcg by mouth daily before breakfast.    . lisinopril (PRINIVIL,ZESTRIL) 10 MG tablet Take 20 mg by mouth daily.     . metoprolol tartrate (LOPRESSOR) 25 MG tablet Take 0.5 tablets (12.5 mg total) by mouth 2 (two) times daily. 30 tablet 11   No current facility-administered medications for this visit.       Physical Exam:   BP 132/76 (BP Location: Right Arm, Patient Position: Sitting, Cuff Size: Large)   Pulse 62   Ht 5' 6"  (1.676 m)   Wt 176 lb (79.8 kg)   SpO2 98% Comment: ON RA  BMI 28.41 kg/m   General:  Well-appearing  Chest:   Clear to auscultation  CV:   Regular rate  and rhythm without murmur  Incisions:  Completely healed  Abdomen:  Soft nontender  Extremities:  Warm and well-perfused  Diagnostic Tests:  n/a   Impression:  Patient is doing very well approximately 1 year status post aortic valve replacement and coronary artery bypass grafting  Plan:  We have not recommended any change the patient's current medications.  The patient has been reminded regarding the importance of dental hygiene and the lifelong need for antibiotic prophylaxis for all dental cleanings and other related invasive procedures.  The patient will continue to follow-up intermittently with Dr. Burt Knack.  He will call return to our office in the future only should specific problems or questions arise.  I spent in excess of 15 minutes during the conduct of this office consultation and >50% of this time involved direct face-to-face encounter with the patient for counseling and/or coordination of their care.   Valentina Gu. Roxy Manns, MD 08/16/2018 4:46 PM

## 2018-08-16 NOTE — Telephone Encounter (Signed)
  Follow up Call-  Call back number 08/13/2018  Post procedure Call Back phone  # [phone]  Permission to leave phone message Yes  Some recent data might be hidden     Patient questions:  Do you have a fever, pain , or abdominal swelling? No. Pain Score  0 *  Have you tolerated food without any problems? Yes.    Have you been able to return to your normal activities? Yes.    Do you have any questions about your discharge instructions: Diet   No. Medications  No. Follow up visit  No.  Do you have questions or concerns about your Care? No.  Actions: * If pain score is 4 or above: No action needed, pain <4.

## 2018-08-16 NOTE — Patient Instructions (Signed)

## 2018-08-25 ENCOUNTER — Other Ambulatory Visit: Payer: Self-pay | Admitting: Internal Medicine

## 2018-08-25 DIAGNOSIS — R160 Hepatomegaly, not elsewhere classified: Secondary | ICD-10-CM

## 2018-09-08 ENCOUNTER — Encounter: Payer: Self-pay | Admitting: Cardiovascular Disease

## 2018-09-08 ENCOUNTER — Ambulatory Visit: Payer: BLUE CROSS/BLUE SHIELD | Admitting: Cardiovascular Disease

## 2018-09-08 VITALS — BP 134/80 | HR 65 | Ht 66.0 in | Wt 174.2 lb

## 2018-09-08 DIAGNOSIS — Q231 Congenital insufficiency of aortic valve: Secondary | ICD-10-CM

## 2018-09-08 DIAGNOSIS — Z952 Presence of prosthetic heart valve: Secondary | ICD-10-CM

## 2018-09-08 DIAGNOSIS — E782 Mixed hyperlipidemia: Secondary | ICD-10-CM | POA: Diagnosis not present

## 2018-09-08 DIAGNOSIS — I2510 Atherosclerotic heart disease of native coronary artery without angina pectoris: Secondary | ICD-10-CM | POA: Diagnosis not present

## 2018-09-08 NOTE — Patient Instructions (Signed)
Medication Instructions:  No change If you need a refill on your cardiac medications before your next appointment, please call your pharmacy.   Lab work: none If you have labs (blood work) drawn today and your tests are completely normal, you will receive your results only by: Marland Kitchen MyChart Message (if you have MyChart) OR . A paper copy in the mail If you have any lab test that is abnormal or we need to change your treatment, we will call you to review the results.  Testing/Procedures: none  Follow-Up: At [facility], you and your health needs are our priority.  As part of our continuing mission to provide you with exceptional heart care, we have created designated Provider Care Teams.  These Care Teams include your primary Cardiologist (physician) and Advanced Practice Providers (APPs -  Physician Assistants and Nurse Practitioners) who all work together to provide you with the care you need, when you need it. You will need a follow up appointment in:  12 months.  Please call our office 2 months in advance to schedule this appointment.  You may see [name], MD or one of the following Advanced Practice Providers on your designated Care Team: Richardson Dopp, PA-C [place], [place] . Daune Perch, NP  Any Other Special Instructions Will Be Listed Below (If Applicable).

## 2018-09-08 NOTE — Progress Notes (Signed)
Cardiology Office Note:    Date:  09/08/2018   ID:  James Wilkins, DOB Jan 26, 1961, MRN [id number]  PCP:  Marton Redwood, MD  Cardiologist:  [name], MD  Electrophysiologist:  None   Referring MD: Marton Redwood, MD   Chief Complaint  Patient presents with  . Follow-up    aortic valve disease    History of Present Illness:    James Wilkins is a 57 y.o. male with a hx of aortic valve disease, presenting for follow-up evaluation.  The patient underwent CABG and bioprosthetic aortic valve replacement about 1 year ago in November 2018.  He was treated with a 25 mm Edwards Intuity valve.  He is doing well from a cardiac perspective with no symptoms of chest pain, chest pressure, shortness of breath, lightheadedness, or heart palpitations.  He has adjusted his antihypertensive medications on his own based on home readings.  He feels like his blood pressure is been well controlled recently on lisinopril 20 mg and metoprolol 12.5 mg twice daily.  Past Medical History:  Diagnosis Date  . Aortic stenosis   . Arthritis    "joints" (08/27/2017)  . Bicuspid aortic valve   . Celiac disease   . Coronary artery disease involving native coronary artery of native heart with angina pectoris ([place])   . Dilation of esophagus    tortuous esophagus  . Dyspnea   . Gastritis    non erosive  . GERD (gastroesophageal reflux disease)   . Heart murmur    "ever since I was little" (08/27/2017)  . Hemorrhoids   . Hiatal hernia   . HLD (hyperlipidemia)   . Hypertension   . Hypothyroidism   . S/P aortic valve replacement with bioprosthetic valve 08/25/2017   25 mm Edwards Intuity Elite bovine pericardial stented rapid deployment valve    Past Surgical History:  Procedure Laterality Date  . AORTIC VALVE REPLACEMENT N/A 08/25/2017   Procedure: AORTIC VALVE REPLACEMENT (AVR);  Surgeon: Rexene Alberts, MD;  Location: [facility];  Service: Open Heart Surgery;  Laterality: N/A;  . AORTIC VALVE REPLACEMENT  (AVR)/CORONARY ARTERY BYPASS GRAFTING (CABG)  08/25/2017   25 mm Edwards Intuity Elite bovine pericardial stented rapid deployment valve  . BACK SURGERY    . BLADDER SURGERY     "weak spots in my bladder; they took them out"  . CARDIAC CATHETERIZATION  08/12/2017  . CARDIAC VALVE REPLACEMENT    . COLONOSCOPY  2009  . CORONARY ANGIOPLASTY    . CORONARY ARTERY BYPASS GRAFT  08/25/2017   LIMA to LAD, SVG to [place], EVH via left thigh  . CORONARY ARTERY BYPASS GRAFT N/A 08/25/2017   Procedure: CORONARY ARTERY BYPASS GRAFTING (CABG) x two, using left internal mammary artery and left leg greater saphenous vein harvested endoscopically;  Surgeon: Rexene Alberts, MD;  Location: [facility];  Service: Open Heart Surgery;  Laterality: N/A;  . ESOPHAGOGASTRODUODENOSCOPY (EGD) WITH ESOPHAGEAL DILATION  2008  . [place]; 2016 X 2  . SHOULDER ARTHROSCOPY W/ ROTATOR CUFF REPAIR Right 2012  . SHOULDER ARTHROSCOPY W/ ROTATOR CUFF REPAIR Left 2014 X 2  . TEE WITHOUT CARDIOVERSION N/A 08/25/2017   Procedure: TRANSESOPHAGEAL ECHOCARDIOGRAM (TEE);  Surgeon: Rexene Alberts, MD;  Location: [facility];  Service: Open Heart Surgery;  Laterality: N/A;  . UPPER GASTROINTESTINAL ENDOSCOPY      Current Medications: Current Meds  Medication Sig  . aspirin EC 81 MG tablet Take 81 mg by mouth daily.  Marland Kitchen  levothyroxine (SYNTHROID, LEVOTHROID) 88 MCG tablet Take 88 mcg by mouth daily before breakfast.  . lisinopril (PRINIVIL,ZESTRIL) 20 MG tablet Take 20 mg by mouth daily.  . metoprolol tartrate (LOPRESSOR) 25 MG tablet Take 0.5 tablets (12.5 mg total) by mouth 2 (two) times daily.  . rosuvastatin (CRESTOR) 20 MG tablet Take 1 tablet by mouth at bedtime.     Allergies:   Wheat bran   Social History   Socioeconomic History  . Marital status: Married    Spouse name: Not on file  . Number of children: 0  . Years of education: Not on file  . Highest education level: Not on file  Occupational  History  . Occupation: Public affairs consultant  . Financial resource strain: Not on file  . Food insecurity:    Worry: Not on file    Inability: Not on file  . Transportation needs:    Medical: Not on file    Non-medical: Not on file  Tobacco Use  . Smoking status: Never Smoker  . Smokeless tobacco: Never Used  Substance and Sexual Activity  . Alcohol use: No  . Drug use: No  . Sexual activity: Yes  Lifestyle  . Physical activity:    Days per week: Not on file    Minutes per session: Not on file  . Stress: Not on file  Relationships  . Social connections:    Talks on phone: Not on file    Gets together: Not on file    Attends religious service: Not on file    Active member of club or organization: Not on file    Attends meetings of clubs or organizations: Not on file    Relationship status: Not on file  Other Topics Concern  . Not on file  Social History Narrative   Married no children works as a Aeronautical engineer in [place] History: The patient's family history includes Brain cancer in his father; Cancer in his other; Colon polyps in his sister; Diabetes in his father; Heart disease in his father and mother; Hypertension in his other. There is no history of Colon cancer, Kidney disease, Esophageal cancer, Stomach cancer, or Rectal cancer.  ROS:   Please see the history of present illness.    Positive for chest congestion.  All other systems reviewed and are negative.  EKGs/Labs/Other Studies Reviewed:    The following studies were reviewed today: 2D echocardiogram 09/24/2017: Study Conclusions  - Left ventricle: The cavity size was normal. Wall thickness was   normal. Systolic function was normal. The estimated ejection   fraction was in the range of 60% to 65%. Wall motion was normal;   there were no regional wall motion abnormalities. Features are   consistent with a pseudonormal left ventricular filling pattern,   with concomitant  abnormal relaxation and increased filling   pressure (grade 2 diastolic dysfunction). - Ventricular septum: Septal motion showed abnormal function and   dyssynergy. - Aortic valve: A bioprosthesis was present. Mean gradient (S): 10   mm Hg. Peak gradient (S): 18 mm Hg. - Mitral valve: There was mild regurgitation. - Right ventricle: The cavity size was mildly dilated.  Impressions:  - Normal LV systolic function; moderate diastolic dysfunction; s/p   AVR with mean gradient 10 mmHg; no AI; mild MR; mild RVE.  EKG:  EKG is not ordered today.    Recent Labs: 07/02/2018: BUN 14; Creatinine, Ser 1.44; Hemoglobin 13.0; Platelets 181; Potassium 3.8; Sodium 140  Recent Lipid Panel No results found for: CHOL, TRIG, HDL, CHOLHDL, VLDL, LDLCALC, LDLDIRECT  Physical Exam:    VS:  BP 134/80   Pulse 65   Ht 5' 6"  (1.676 m)   Wt 174 lb 3.2 oz (79 kg)   SpO2 97%   BMI 28.12 kg/m     Wt Readings from Last 3 Encounters:  09/08/18 174 lb 3.2 oz (79 kg)  08/16/18 176 lb (79.8 kg)  08/13/18 176 lb (79.8 kg)     GEN:  Well nourished, well developed in no acute distress HEENT: Normal NECK: No JVD; No carotid bruits LYMPHATICS: No lymphadenopathy CARDIAC: RRR, 2/6 systolic ejection murmur at the right upper sternal border, no diastolic murmur. RESPIRATORY:  Clear to auscultation without rales, wheezing or rhonchi  ABDOMEN: Soft, non-tender, non-distended MUSCULOSKELETAL:  No edema; No deformity  SKIN: Warm and dry NEUROLOGIC:  Alert and oriented x 3 PSYCHIATRIC:  Normal affect   ASSESSMENT:    1. S/P AVR   2. Bicuspid aortic valve   3. Coronary artery disease involving native coronary artery of native heart without angina pectoris   4. Mixed hyperlipidemia    PLAN:    In order of problems listed above:  1. The patient is doing well with [place] Heart Association functional class I symptoms.  We discussed SBE prophylaxis when indicated.  He continues on low-dose aspirin for  antiplatelet therapy.  I will see him back in 1 year unless problems arise in the interim. 2. The patient is having no anginal symptoms.  He is on aspirin, beta-blocker, and ACE inhibitor, and a statin drug. 3. His lipids are followed by Dr. Brigitte Pulse.  His statin drug was changed to rosuvastatin for more potent lipid-lowering to achieve an LDL cholesterol of 70 or less.   Medication Adjustments/Labs and Tests Ordered: Current medicines are reviewed at length with the patient today.  Concerns regarding medicines are outlined above.  No orders of the defined types were placed in this encounter.  No orders of the defined types were placed in this encounter.   Patient Instructions  Medication Instructions:  No change If you need a refill on your cardiac medications before your next appointment, please call your pharmacy.   Lab work: none If you have labs (blood work) drawn today and your tests are completely normal, you will receive your results only by: Marland Kitchen MyChart Message (if you have MyChart) OR . A paper copy in the mail If you have any lab test that is abnormal or we need to change your treatment, we will call you to review the results.  Testing/Procedures: none  Follow-Up: At [facility], you and your health needs are our priority.  As part of our continuing mission to provide you with exceptional heart care, we have created designated Provider Care Teams.  These Care Teams include your primary Cardiologist (physician) and Advanced Practice Providers (APPs -  Physician Assistants and Nurse Practitioners) who all work together to provide you with the care you need, when you need it. You will need a follow up appointment in:  12 months.  Please call our office 2 months in advance to schedule this appointment.  You may see [name], MD or one of the following Advanced Practice Providers on your designated Care Team: Richardson Dopp, PA-C [place], [place] . Daune Perch, NP  Any  Other Special Instructions Will Be Listed Below (If Applicable).       Signed, [name], MD  09/08/2018 4:17  PM    [facility]

## 2018-10-28 DIAGNOSIS — I10 Essential (primary) hypertension: Secondary | ICD-10-CM | POA: Diagnosis not present

## 2018-10-28 DIAGNOSIS — E038 Other specified hypothyroidism: Secondary | ICD-10-CM | POA: Diagnosis not present

## 2018-10-28 DIAGNOSIS — E7849 Other hyperlipidemia: Secondary | ICD-10-CM | POA: Diagnosis not present

## 2018-11-23 ENCOUNTER — Other Ambulatory Visit: Payer: Self-pay | Admitting: Cardiovascular Disease

## 2018-12-31 IMAGING — CT CT ANGIO CHEST
2 of 7 series · 16 of 46 positions shown · IV contrast (isovue)
Comparison: None.

CLINICAL DATA: Severe aortic stenosis. Intermittent chest
tightness. Evaluate for thoracic aortic aneurysm.

EXAM:
CT ANGIOGRAPHY CHEST WITH CONTRAST
TECHNIQUE: Multidetector CT imaging of the chest was performed using the
standard protocol during bolus administration of intravenous
contrast. Multiplanar CT image reconstructions and MIPs were
obtained to evaluate the vascular anatomy.
CONTRAST:  100 cc Isovue 370

[Series 4: aorta 3.0 i31f 2 · axial · 0.74mm/px · z∈[-305,-50]mm · 13 of 101 slices shown]
[im 8/101  lung]
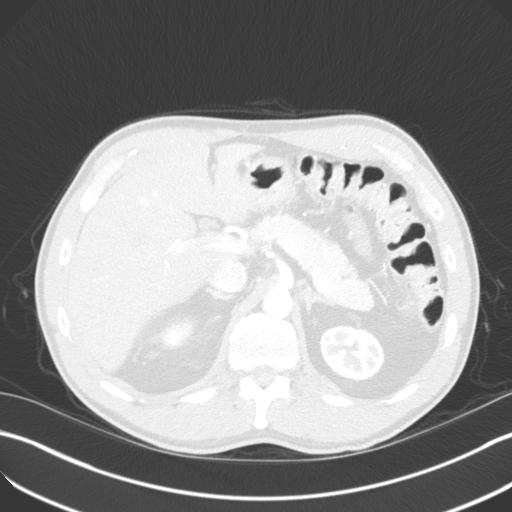
[im 15/101  soft-tissue]
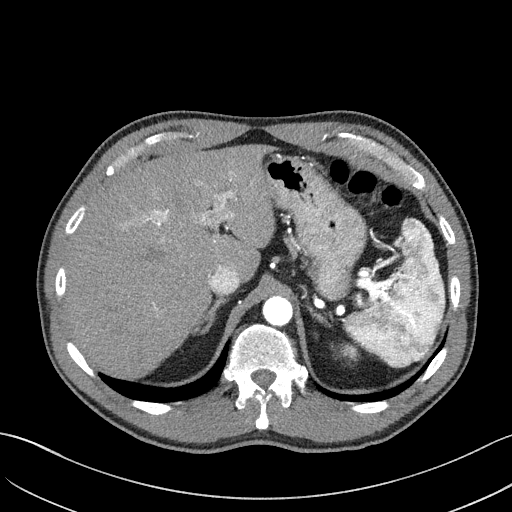
[im 22/101  lung]
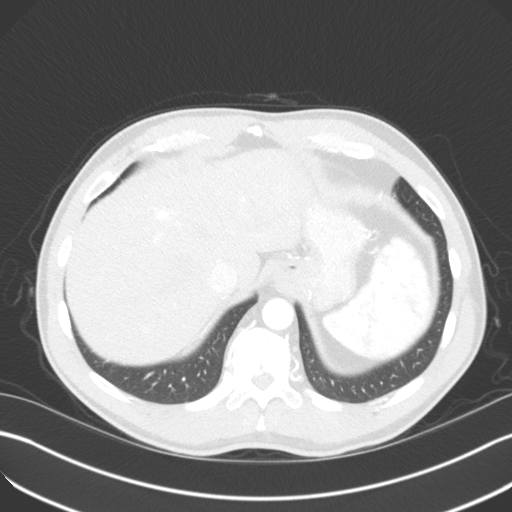
[im 29/101  soft-tissue]
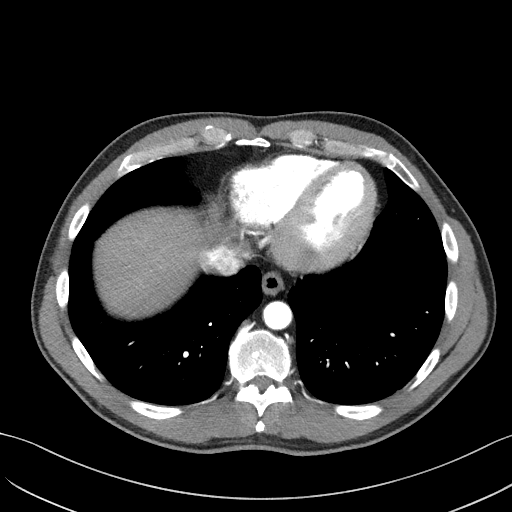
[im 36/101  lung]
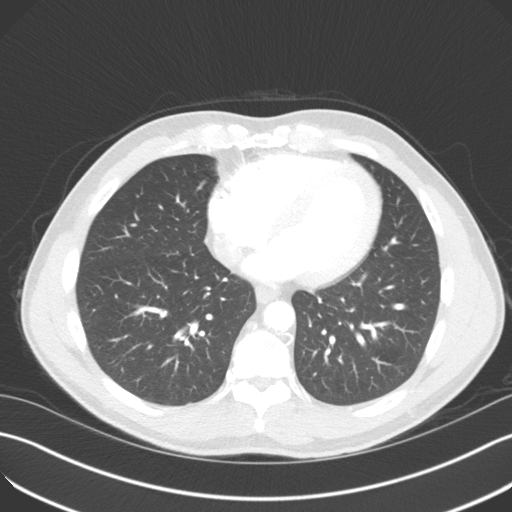
[im 43/101  soft-tissue]
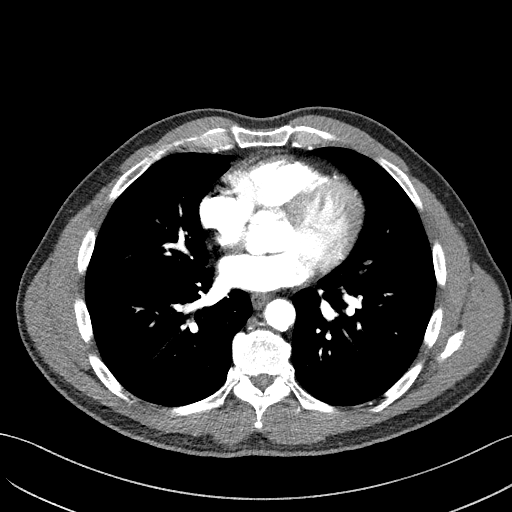
[im 51/101  lung]
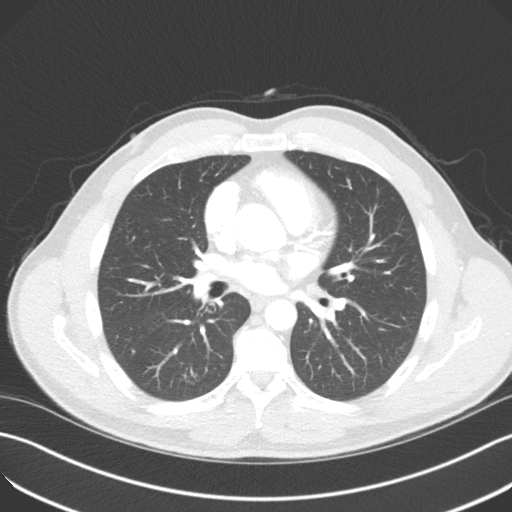
[im 58/101  soft-tissue]
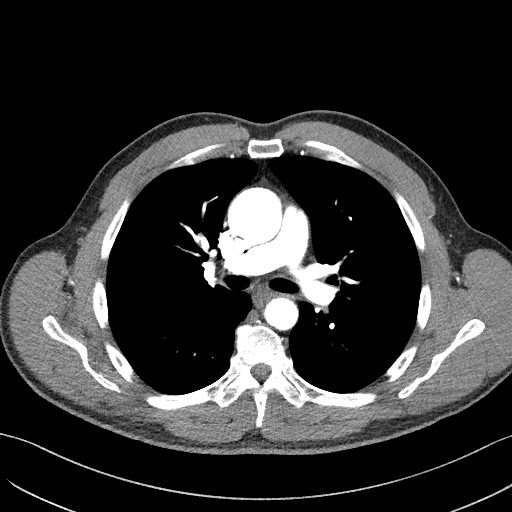
[im 65/101  lung]
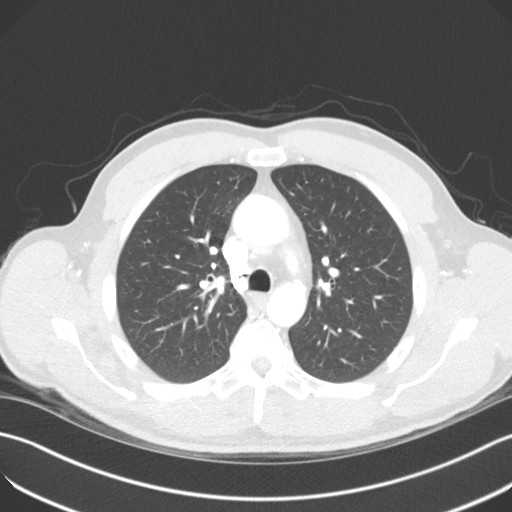
[im 72/101  soft-tissue]
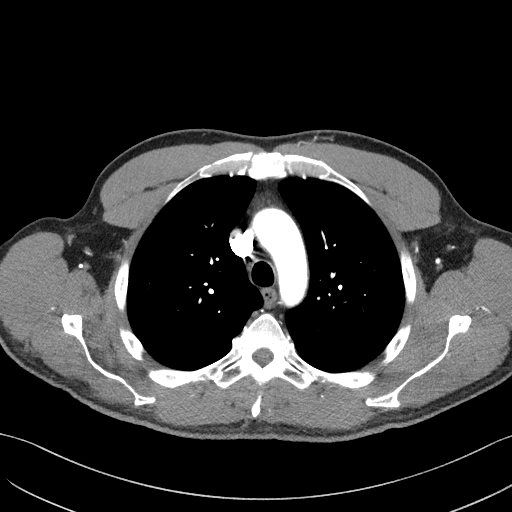
[im 79/101  lung]
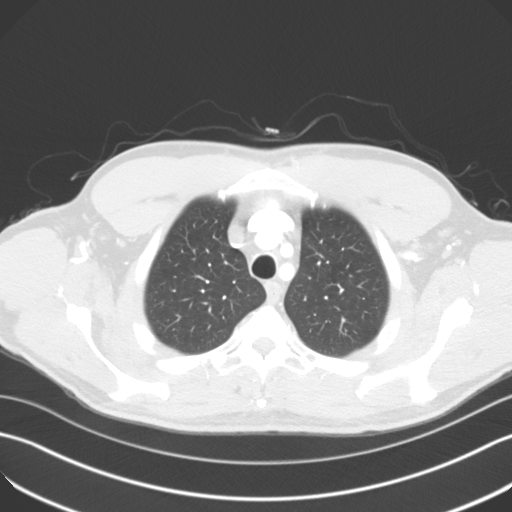
[im 86/101  soft-tissue]
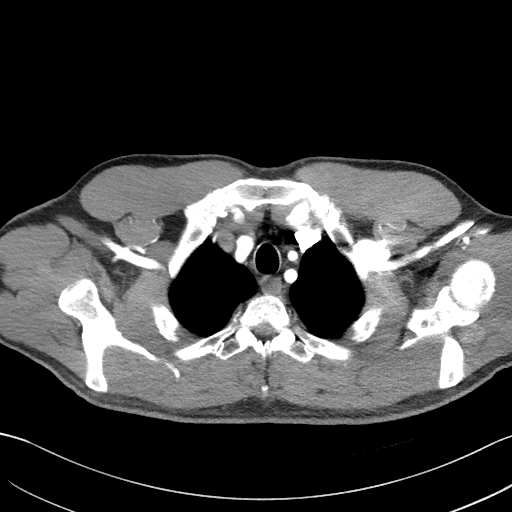
[im 93/101  lung]
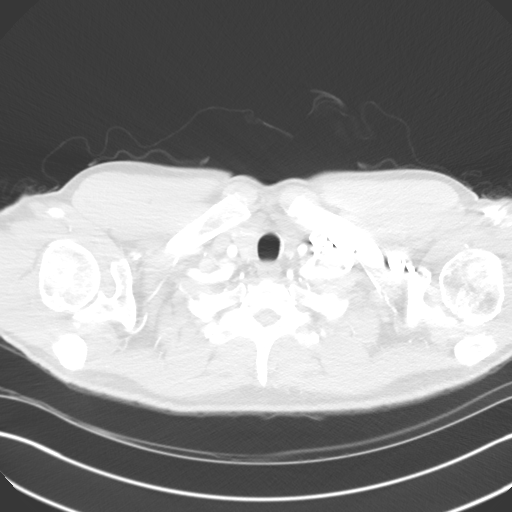

[Series 7: coronals · coronal · 0.60mm/px · 3 of 120 slices shown]
[im 30/120  soft-tissue]
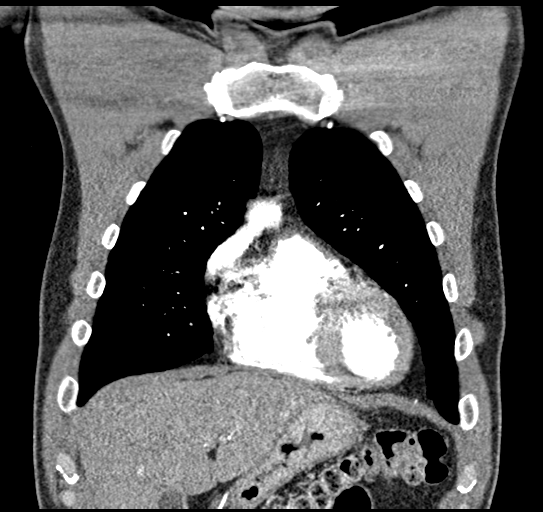
[im 60/120  soft-tissue]
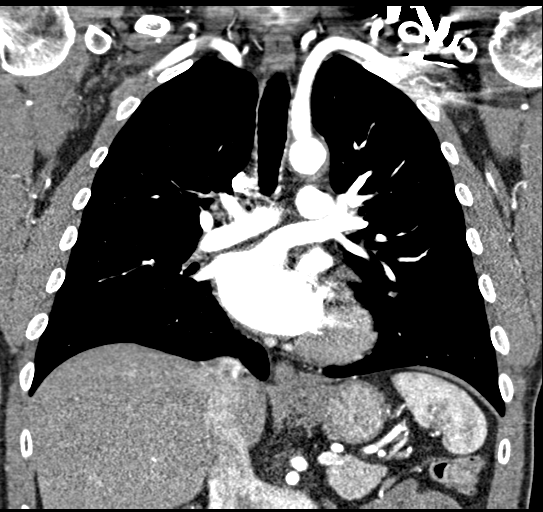
[im 90/120  soft-tissue]
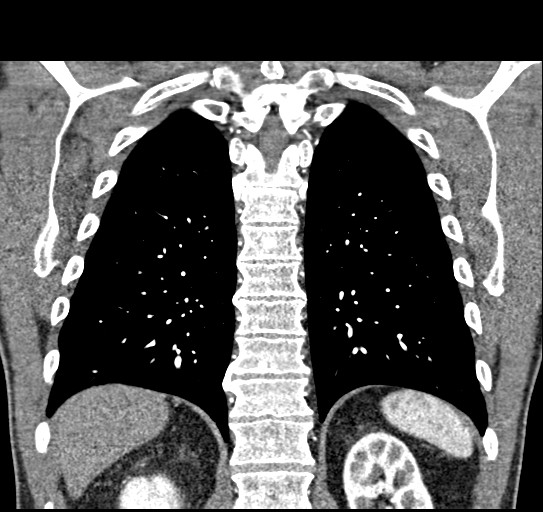

[16 of 46 positions shown; findings below may reference images not displayed]

FINDINGS: Vascular Findings:

There is mild fusiform aneurysmal dilatation of the ascending
thoracic aorta with measurements as follows. The thoracic aorta
tapers to a normal caliber at the level of the aortic arch.
Scattered atherosclerotic plaque within the thoracic aorta, not
resulting in a hemodynamically significant stenosis. No definite
evidence of thoracic aortic dissection or periaortic stranding on
this nongated examination.

Conventional configuration of the aortic arch. The branch vessels of
the aortic arch appear widely patent throughout their imaged course.

Normal heart size. Calcifications within the aortic valve leaflets
compatible provided history of aortic stenosis. No pericardial
effusion.

Although this examination was not tailored for the evaluation the
pulmonary arteries, there are no discrete filling defects within the
central pulmonary arterial tree to suggest central pulmonary
embolism. Normal caliber of the main pulmonary artery.

-------------------------------------------------------------

Thoracic aortic measurements:

Sinotubular junction

32 mm as measured in greatest oblique coronal dimension.

Proximal ascending aorta

41 mm as measured in greatest oblique axial dimension at the level
of the main pulmonary artery (image 43, series 4)

Aortic arch aorta

24 mm  as measured in greatest oblique sagittal dimension.

Proximal descending thoracic aorta

23 mm as measured in greatest oblique axial dimension at the level
of the main pulmonary artery.

Distal descending thoracic aorta

22 mm as measured in greatest oblique axial dimension at the level
of the diaphragmatic hiatus.

Review of the MIP images confirms the above findings.

-------------------------------------------------------------

Non-Vascular Findings:

Mediastinum/Lymph Nodes: No bulky mediastinal, hilar or axillary
lymphadenopathy.

Lungs/Pleura: Minimal tree-in-bud type opacification involving the
superior segment of the right lower lobe with associated faint
internal calcification (image 49, series 5) compatible with
localized post inflammatory change. No discrete focal airspace
opacities. No pleural effusion or pneumothorax. The central
pulmonary airways appear widely patent.

No discrete worrisome pulmonary nodules.

Upper abdomen: Limited early arterial phase evaluation of the upper
abdomen demonstrates a peripherally enhancing nodule within the dome
of the right lobe of the liver which measures approximately 1.2 x
1.0 x 1.0 cm (axial image 70, series 4, coronal image 40, series 7,
which is incompletely evaluated present examination though appears
to demonstrate peripheral nodular interrupted enhancement and is
favored to represent a benign hepatic hemangioma. Small hiatal
hernia.

Musculoskeletal: No acute or aggressive osseous abnormalities.
Stigmata of DISH within the thoracic spine.
IMPRESSION: 1. Mild fusiform aneurysmal dilatation of the ascending thoracic
aorta measuring 4 cm in maximal diameter. Recommend annual imaging
followup by CTA or MRA. This recommendation follows 6333
ACCF/AHA/AATS/ACR/ASA/SCA/DENOVA/PALAPE/SENATUS/MICHELINE Guidelines for the
Diagnosis and Management of Patients with Thoracic Aortic Disease.
Circulation. 6333; 121: [id number]. Aortic aneurysm NOS
(H3JPZ-BCS.P).
2.  Aortic Atherosclerosis (H3JPZ-UWL.L).
3. Calcifications within the aortic valve leaflets compatible
provided history of aortic stenosis.
4. Incidentally noted approximately 1.2 cm lesion within the dome of
the right lobe of the liver, incompletely characterize though in the
absence of a known cirrhosis and/or a primary malignancy is favored
to represent a hepatic hemangioma. Further evaluation with right
upper quadrant abdominal ultrasound could be performed as indicated.

## 2019-03-28 DIAGNOSIS — M19111 Post-traumatic osteoarthritis, right shoulder: Secondary | ICD-10-CM | POA: Diagnosis not present

## 2019-03-28 DIAGNOSIS — M25511 Pain in right shoulder: Secondary | ICD-10-CM | POA: Diagnosis not present

## 2019-05-12 DIAGNOSIS — M25511 Pain in right shoulder: Secondary | ICD-10-CM | POA: Diagnosis not present

## 2019-05-18 DIAGNOSIS — M25511 Pain in right shoulder: Secondary | ICD-10-CM | POA: Diagnosis not present

## 2019-05-18 DIAGNOSIS — M25621 Stiffness of right elbow, not elsewhere classified: Secondary | ICD-10-CM | POA: Diagnosis not present

## 2019-05-18 DIAGNOSIS — M25521 Pain in right elbow: Secondary | ICD-10-CM | POA: Diagnosis not present

## 2019-06-23 DIAGNOSIS — Z1331 Encounter for screening for depression: Secondary | ICD-10-CM | POA: Diagnosis not present

## 2019-06-24 DIAGNOSIS — E291 Testicular hypofunction: Secondary | ICD-10-CM | POA: Diagnosis not present

## 2019-06-24 DIAGNOSIS — I10 Essential (primary) hypertension: Secondary | ICD-10-CM | POA: Diagnosis not present

## 2019-06-24 DIAGNOSIS — E7849 Other hyperlipidemia: Secondary | ICD-10-CM | POA: Diagnosis not present

## 2019-06-24 DIAGNOSIS — Z0000 Encounter for general adult medical examination without abnormal findings: Secondary | ICD-10-CM | POA: Diagnosis not present

## 2019-06-24 DIAGNOSIS — E038 Other specified hypothyroidism: Secondary | ICD-10-CM | POA: Diagnosis not present

## 2019-06-24 DIAGNOSIS — Z125 Encounter for screening for malignant neoplasm of prostate: Secondary | ICD-10-CM | POA: Diagnosis not present

## 2019-07-01 DIAGNOSIS — I25810 Atherosclerosis of coronary artery bypass graft(s) without angina pectoris: Secondary | ICD-10-CM | POA: Diagnosis not present

## 2019-07-01 DIAGNOSIS — N529 Male erectile dysfunction, unspecified: Secondary | ICD-10-CM | POA: Insufficient documentation

## 2019-07-01 DIAGNOSIS — Z23 Encounter for immunization: Secondary | ICD-10-CM | POA: Diagnosis not present

## 2019-07-01 DIAGNOSIS — Z0000 Encounter for general adult medical examination without abnormal findings: Secondary | ICD-10-CM | POA: Diagnosis not present

## 2019-07-01 DIAGNOSIS — E785 Hyperlipidemia, unspecified: Secondary | ICD-10-CM | POA: Diagnosis not present

## 2019-07-01 DIAGNOSIS — R82998 Other abnormal findings in urine: Secondary | ICD-10-CM | POA: Diagnosis not present

## 2019-07-01 DIAGNOSIS — F09 Unspecified mental disorder due to known physiological condition: Secondary | ICD-10-CM | POA: Insufficient documentation

## 2019-07-01 DIAGNOSIS — R0683 Snoring: Secondary | ICD-10-CM | POA: Insufficient documentation

## 2019-07-01 DIAGNOSIS — E039 Hypothyroidism, unspecified: Secondary | ICD-10-CM | POA: Diagnosis not present

## 2019-07-01 DIAGNOSIS — I10 Essential (primary) hypertension: Secondary | ICD-10-CM | POA: Diagnosis not present

## 2019-07-04 DIAGNOSIS — Z1212 Encounter for screening for malignant neoplasm of rectum: Secondary | ICD-10-CM | POA: Diagnosis not present

## 2019-07-06 ENCOUNTER — Other Ambulatory Visit: Payer: Self-pay | Admitting: Internal Medicine

## 2019-07-06 DIAGNOSIS — R160 Hepatomegaly, not elsewhere classified: Secondary | ICD-10-CM

## 2019-07-15 ENCOUNTER — Ambulatory Visit
Admission: RE | Admit: 2019-07-15 | Discharge: 2019-07-15 | Disposition: A | Discharge status: Home / Self Care | Payer: BC Managed Care – PPO | Source: Ambulatory Visit | Attending: Internal Medicine | Admitting: Internal Medicine

## 2019-07-15 DIAGNOSIS — D1809 Hemangioma of other sites: Secondary | ICD-10-CM | POA: Diagnosis not present

## 2019-07-15 DIAGNOSIS — K824 Cholesterolosis of gallbladder: Secondary | ICD-10-CM | POA: Diagnosis not present

## 2019-07-15 DIAGNOSIS — R160 Hepatomegaly, not elsewhere classified: Secondary | ICD-10-CM

## 2019-08-17 IMAGING — US US ABDOMEN LIMITED
1 series · 14 of 25 positions shown · non-contrast
Comparison: Chest CT including portions of upper abdomen August 05, 2017

CLINICAL DATA: Liver lesion seen on recent CT

EXAM:
ULTRASOUND ABDOMEN LIMITED RIGHT UPPER QUADRANT

[Series 1: us abdomen limited · 0.19mm/px · 14 of 45 slices shown]
[im 1/45]
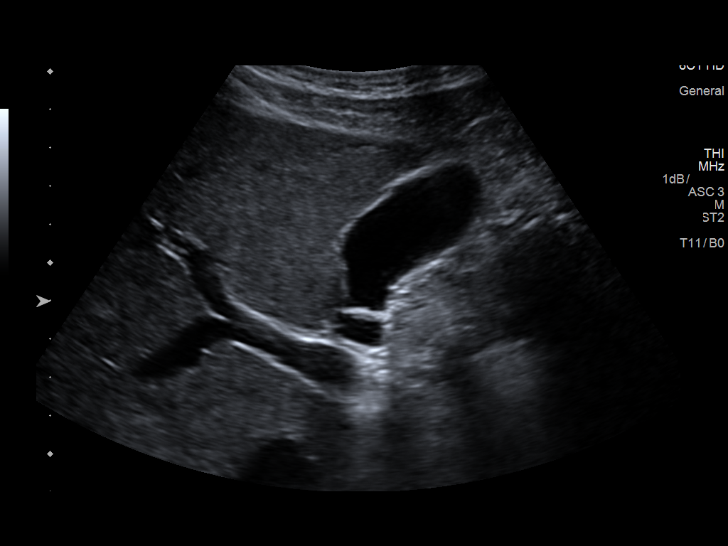
[im 4/45]
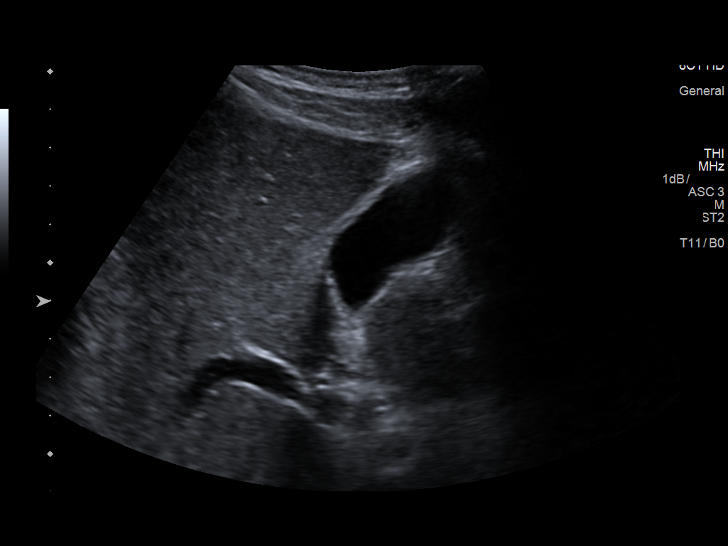
[im 8/45]
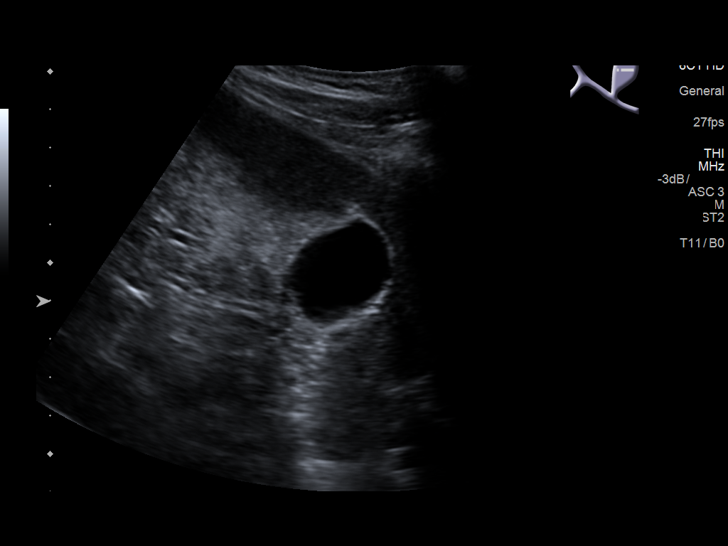
[im 12/45]
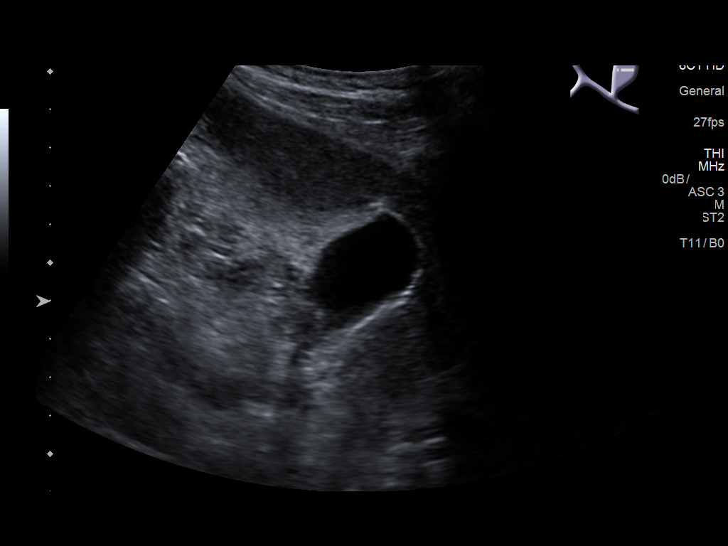
[im 15/45]
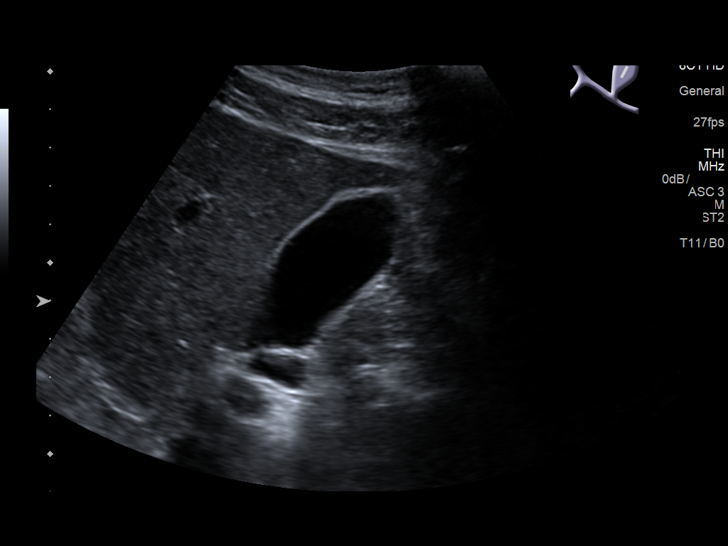
[im 17/45]
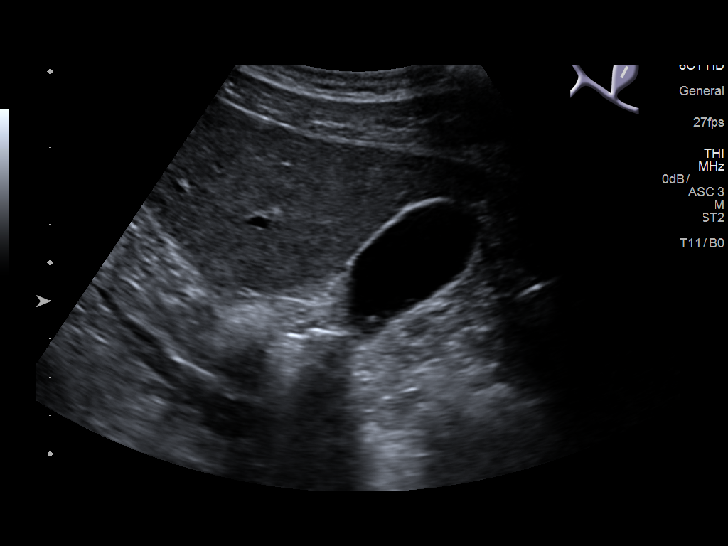
[im 21/45]
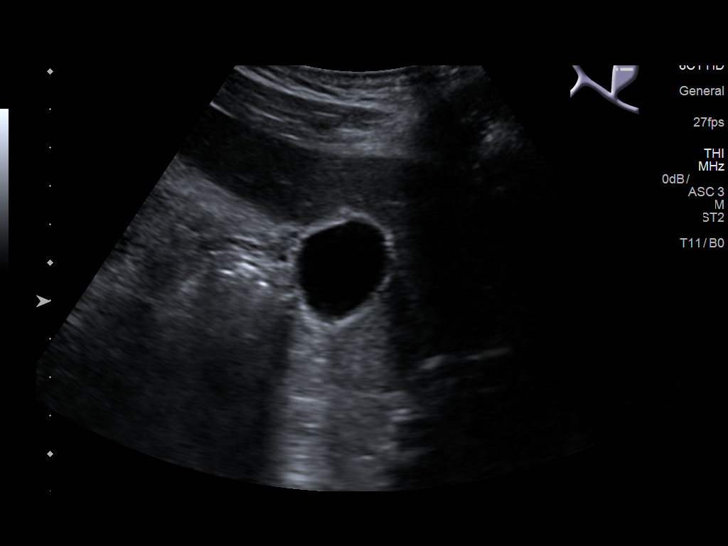
[im 24/45]
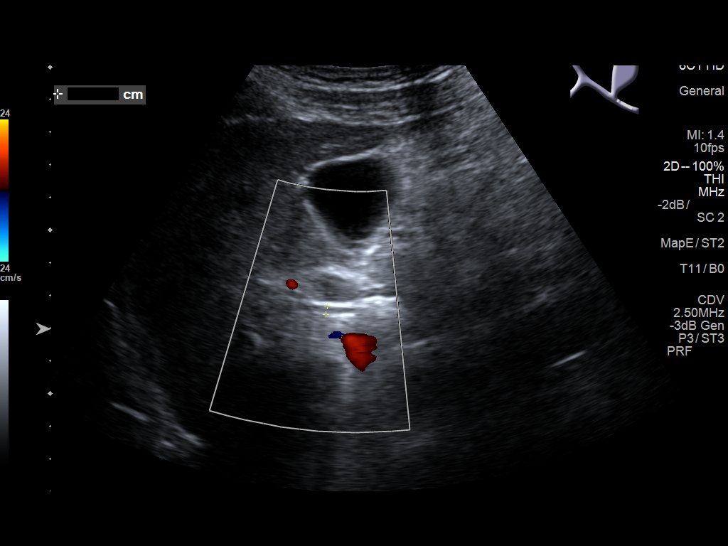
[im 28/45]
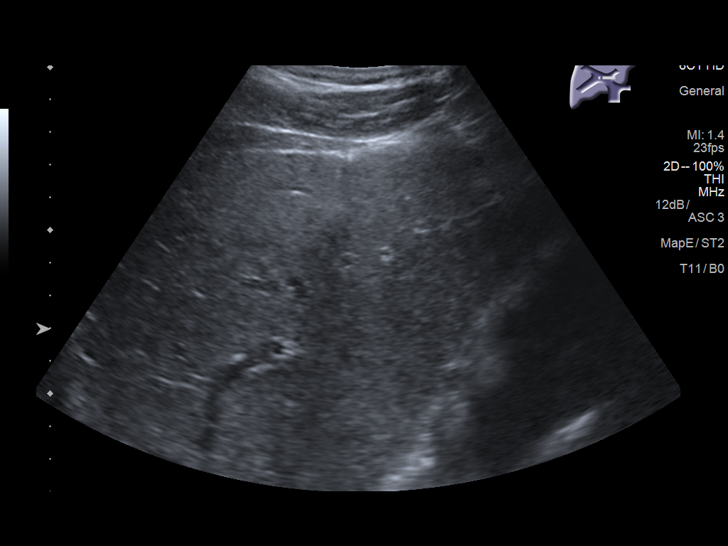
[im 30/45]
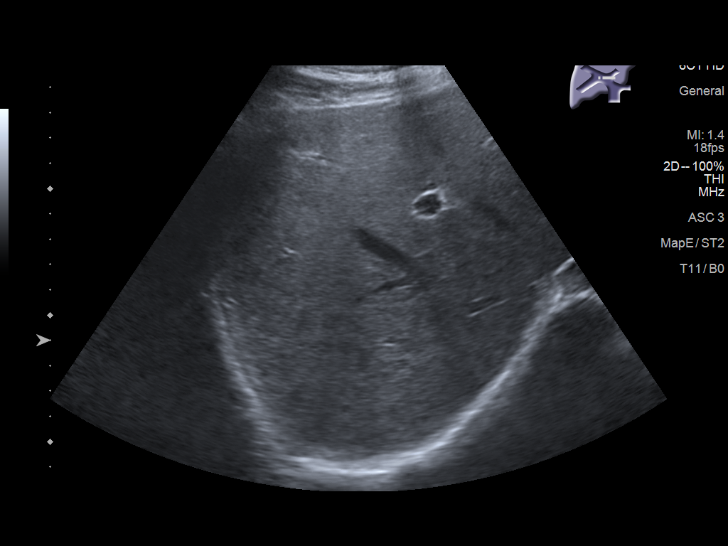
[im 34/45]
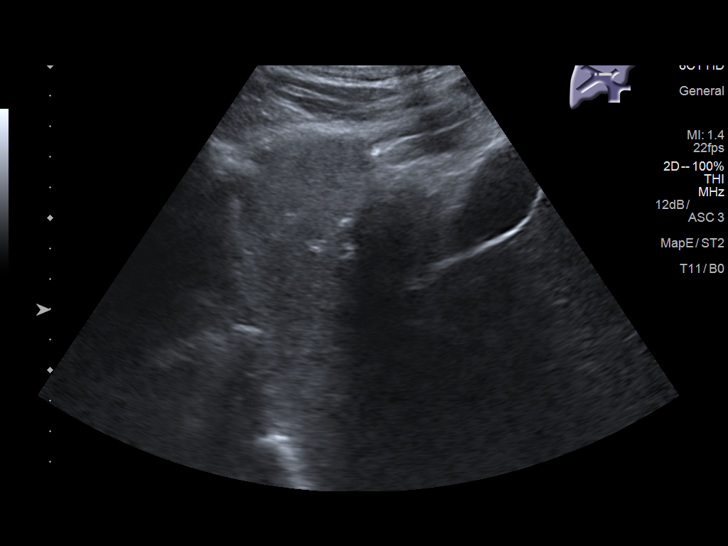
[im 37/45]
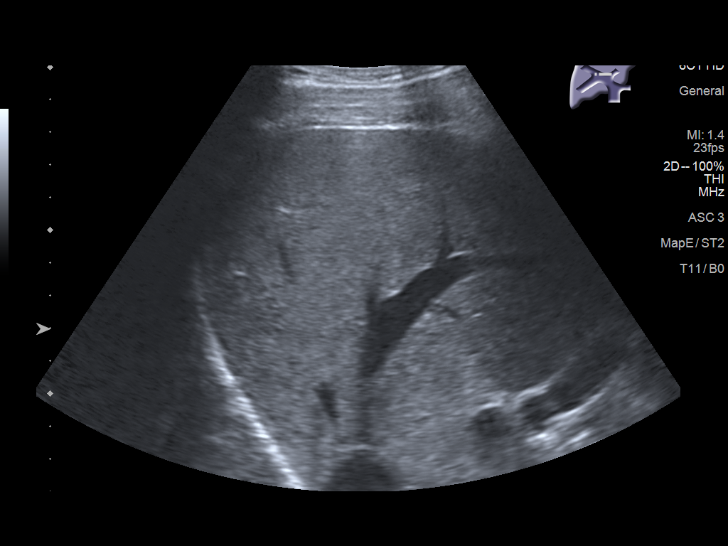
[im 41/45]
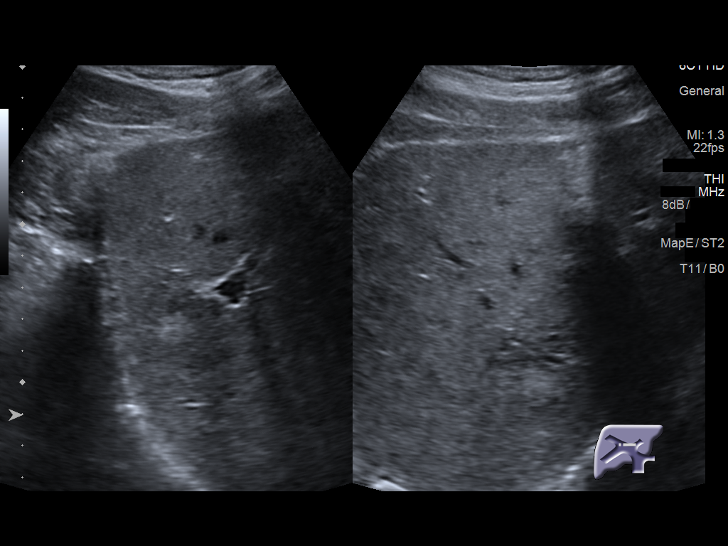
[im 45/45]
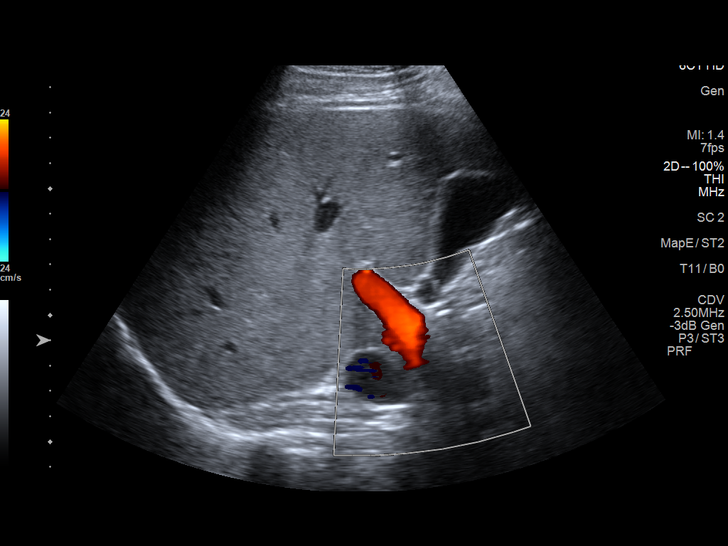

[14 of 25 positions shown; findings below may reference images not displayed]

FINDINGS: Gallbladder:

No gallstones or wall thickening visualized. There is no
pericholecystic fluid. No sonographic Murphy sign noted by
sonographer.

Common bile duct:

Diameter: 3 mm. No intrahepatic or extrahepatic biliary duct
dilatation.

Liver:

There is a 1.5 x 1.1 x 1.5 cm echogenic mass in the anterior segment
of the right lobe of the liver corresponding to the lesion seen on
recent CT. No other focal liver lesion evident. Within normal limits
in parenchymal echogenicity. Portal vein is patent on color Doppler
imaging with normal direction of blood flow towards the liver.
IMPRESSION: 1.5 x 1.1 x 1.5 cm echogenic mass in the anterior segment right lobe
liver, corresponding to the lesion seen on recent CT. The appearance
is felt to be consistent with a hemangioma. They would be prudent to
obtain follow-up ultrasound liver in 1 year to confirm stability.

Study otherwise unremarkable.

## 2019-09-16 ENCOUNTER — Encounter: Payer: Self-pay | Admitting: Cardiovascular Disease

## 2019-09-16 ENCOUNTER — Other Ambulatory Visit: Payer: Self-pay

## 2019-09-16 ENCOUNTER — Ambulatory Visit: Payer: BC Managed Care – PPO | Admitting: Cardiovascular Disease

## 2019-09-16 VITALS — BP 130/70 | HR 71 | Ht 66.0 in | Wt 173.0 lb

## 2019-09-16 DIAGNOSIS — I447 Left bundle-branch block, unspecified: Secondary | ICD-10-CM | POA: Diagnosis not present

## 2019-09-16 DIAGNOSIS — I2510 Atherosclerotic heart disease of native coronary artery without angina pectoris: Secondary | ICD-10-CM | POA: Diagnosis not present

## 2019-09-16 DIAGNOSIS — R0609 Other forms of dyspnea: Secondary | ICD-10-CM

## 2019-09-16 DIAGNOSIS — R0600 Dyspnea, unspecified: Secondary | ICD-10-CM | POA: Diagnosis not present

## 2019-09-16 DIAGNOSIS — Z952 Presence of prosthetic heart valve: Secondary | ICD-10-CM | POA: Diagnosis not present

## 2019-09-16 NOTE — Patient Instructions (Signed)
Medication Instructions:  Your provider recommends that you continue on your current medications as directed. Please refer to the Current Medication list given to you today.   *If you need a refill on your cardiac medications before your next appointment, please call your pharmacy*  Testing/Procedures: Your provider has requested that you have an echocardiogram. Echocardiography is a painless test that uses sound waves to create images of your heart. It provides your doctor with information about the size and shape of your heart and how well your heart's chambers and valves are working. This procedure takes approximately one hour. There are no restrictions for this procedure.  Follow-Up: At [facility], you and your health needs are our priority.  As part of our continuing mission to provide you with exceptional heart care, we have created designated Provider Care Teams.  These Care Teams include your primary Cardiologist (physician) and Advanced Practice Providers (APPs -  Physician Assistants and Nurse Practitioners) who all work together to provide you with the care you need, when you need it. Your next appointment:   12 month(s) The format for your next appointment:   In Person Provider:   You may see [name], MD or one of the following Advanced Practice Providers on your designated Care Team:    Richardson Dopp, PA-C  [place], [place]  Daune Perch, [place]

## 2019-09-16 NOTE — Progress Notes (Signed)
Cardiology Office Note:    Date:  09/16/2019   ID:  James Wilkins, DOB 08-06-1961, MRN [id number]  PCP:  Marton Redwood, MD  Cardiologist:  [name], MD  Electrophysiologist:  None   Referring MD: Marton Redwood, MD   Chief Complaint  Patient presents with  . Shortness of Breath    History of Present Illness:    James Wilkins is a 58 y.o. male with a hx of aortic valve disease, presenting for follow-up evaluation.  He was last seen September 08, 2018 for annual follow-up.  The patient underwent combined CABG and bioprosthetic aortic valve replacement in 2018, treated with a 25 mm Edwards Intuity valve.  The patient is here alone today.  He has remained active with his work.  He complains of shortness of breath when bending over to do work.  He also has shortness of breath when he has to walk up an incline or at a brisk pace.  He has not had chest pain or chest pressure with exertion.  He denies lightheadedness, heart palpitations, orthopnea, or PND.  He admits to mild swelling of the left leg ever since the saphenous vein graft was harvested from that side.  No other complaints.  Past Medical History:  Diagnosis Date  . Aortic stenosis   . Arthritis    "joints" (08/27/2017)  . Bicuspid aortic valve   . Celiac disease   . Coronary artery disease involving native coronary artery of native heart with angina pectoris ([place])   . Dilation of esophagus    tortuous esophagus  . Dyspnea   . Gastritis    non erosive  . GERD (gastroesophageal reflux disease)   . Heart murmur    "ever since I was little" (08/27/2017)  . Hemorrhoids   . Hiatal hernia   . HLD (hyperlipidemia)   . Hypertension   . Hypothyroidism   . S/P aortic valve replacement with bioprosthetic valve 08/25/2017   25 mm Edwards Intuity Elite bovine pericardial stented rapid deployment valve    Past Surgical History:  Procedure Laterality Date  . AORTIC VALVE REPLACEMENT N/A 08/25/2017   Procedure: AORTIC VALVE  REPLACEMENT (AVR);  Surgeon: Rexene Alberts, MD;  Location: [place];  Service: Open Heart Surgery;  Laterality: N/A;  . AORTIC VALVE REPLACEMENT (AVR)/CORONARY ARTERY BYPASS GRAFTING (CABG)  08/25/2017   25 mm Edwards Intuity Elite bovine pericardial stented rapid deployment valve  . BACK SURGERY    . BLADDER SURGERY     "weak spots in my bladder; they took them out"  . CARDIAC CATHETERIZATION  08/12/2017  . CARDIAC VALVE REPLACEMENT    . COLONOSCOPY  2009  . CORONARY ANGIOPLASTY    . CORONARY ARTERY BYPASS GRAFT  08/25/2017   LIMA to LAD, SVG to [place], EVH via left thigh  . CORONARY ARTERY BYPASS GRAFT N/A 08/25/2017   Procedure: CORONARY ARTERY BYPASS GRAFTING (CABG) x two, using left internal mammary artery and left leg greater saphenous vein harvested endoscopically;  Surgeon: Rexene Alberts, MD;  Location: [facility];  Service: Open Heart Surgery;  Laterality: N/A;  . ESOPHAGOGASTRODUODENOSCOPY (EGD) WITH ESOPHAGEAL DILATION  2008  . [place]; 2016 X 2  . SHOULDER ARTHROSCOPY W/ ROTATOR CUFF REPAIR Right 2012  . SHOULDER ARTHROSCOPY W/ ROTATOR CUFF REPAIR Left 2014 X 2  . TEE WITHOUT CARDIOVERSION N/A 08/25/2017   Procedure: TRANSESOPHAGEAL ECHOCARDIOGRAM (TEE);  Surgeon: Rexene Alberts, MD;  Location: [facility];  Service: Open Heart Surgery;  Laterality: N/A;  . UPPER GASTROINTESTINAL ENDOSCOPY      Current Medications: Current Meds  Medication Sig  . aspirin EC 81 MG tablet Take 81 mg by mouth daily.  Marland Kitchen levothyroxine (SYNTHROID, LEVOTHROID) 88 MCG tablet Take 88 mcg by mouth daily before breakfast.  . lisinopril (PRINIVIL,ZESTRIL) 20 MG tablet Take 20 mg by mouth daily.  . metoprolol tartrate (LOPRESSOR) 25 MG tablet TAKE 1 TABLET BY MOUTH TWICE DAILY.  . rosuvastatin (CRESTOR) 20 MG tablet Take 1 tablet by mouth at bedtime.     Allergies:   Wheat bran   Social History   Socioeconomic History  . Marital status: Married    Spouse name: Not on file   . Number of children: 0  . Years of education: Not on file  . Highest education level: Not on file  Occupational History  . Occupation: Aeronautical engineer  Tobacco Use  . Smoking status: Never Smoker  . Smokeless tobacco: Never Used  Substance and Sexual Activity  . Alcohol use: No  . Drug use: No  . Sexual activity: Yes  Other Topics Concern  . Not on file  Social History Narrative   Married no children works as a Aeronautical engineer in [place] Strain:   . Difficulty of Paying Living Expenses: Not on file  Food Insecurity:   . Worried About Charity fundraiser in the Last Year: Not on file  . Ran Out of Food in the Last Year: Not on file  Transportation Needs:   . Lack of Transportation (Medical): Not on file  . Lack of Transportation (Non-Medical): Not on file  Physical Activity:   . Days of Exercise per Week: Not on file  . Minutes of Exercise per Session: Not on file  Stress:   . Feeling of Stress : Not on file  Social Connections:   . Frequency of Communication with Friends and Family: Not on file  . Frequency of Social Gatherings with Friends and Family: Not on file  . Attends Religious Services: Not on file  . Active Member of Clubs or Organizations: Not on file  . Attends Archivist Meetings: Not on file  . Marital Status: Not on file     Family History: The patient's family history includes Brain cancer in his father; Cancer in an other family member; Colon polyps in his sister; Diabetes in his father; Heart disease in his father and mother; Hypertension in an other family member. There is no history of Colon cancer, Kidney disease, Esophageal cancer, Stomach cancer, or Rectal cancer.  ROS:   Please see the history of present illness.    All other systems reviewed and are negative.  EKGs/Labs/Other Studies Reviewed:    The following studies were reviewed today: 2D echocardiogram  09/24/2017: Study Conclusions  - Left ventricle: The cavity size was normal. Wall thickness was   normal. Systolic function was normal. The estimated ejection   fraction was in the range of 60% to 65%. Wall motion was normal;   there were no regional wall motion abnormalities. Features are   consistent with a pseudonormal left ventricular filling pattern,   with concomitant abnormal relaxation and increased filling   pressure (grade 2 diastolic dysfunction). - Ventricular septum: Septal motion showed abnormal function and   dyssynergy. - Aortic valve: A bioprosthesis was present. Mean gradient (S): 10   mm Hg. Peak gradient (S): 18 mm Hg. - Mitral valve: There was  mild regurgitation. - Right ventricle: The cavity size was mildly dilated.  Impressions:  - Normal LV systolic function; moderate diastolic dysfunction; s/p   AVR with mean gradient 10 mmHg; no AI; mild MR; mild RVE.  EKG:  EKG is ordered today.  The ekg ordered today demonstrates normal sinus rhythm, left bundle branch block, heart rate 71 bpm.  No significant change from previous tracings.  Recent Labs: No results found for requested labs within last 8760 hours.  Recent Lipid Panel No results found for: CHOL, TRIG, HDL, CHOLHDL, VLDL, LDLCALC, LDLDIRECT  Physical Exam:    VS:  BP 130/70   Pulse 71   Ht 5' 6"  (1.676 m)   Wt 173 lb (78.5 kg)   SpO2 95%   BMI 27.92 kg/m     Wt Readings from Last 3 Encounters:  09/16/19 173 lb (78.5 kg)  09/08/18 174 lb 3.2 oz (79 kg)  08/16/18 176 lb (79.8 kg)     GEN:  Well nourished, well developed in no acute distress HEENT: Normal NECK: No JVD; No carotid bruits LYMPHATICS: No lymphadenopathy CARDIAC: RRR, 2/6 early peaking systolic ejection murmur at the right upper sternal border RESPIRATORY:  Clear to auscultation without rales, wheezing or rhonchi  ABDOMEN: Soft, non-tender, non-distended MUSCULOSKELETAL:  No edema; No deformity  SKIN: Warm and dry NEUROLOGIC:   Alert and oriented x 3 PSYCHIATRIC:  Normal affect   ASSESSMENT:    1. S/P AVR   2. Coronary artery disease involving native coronary artery of native heart without angina pectoris   3. LBBB (left bundle branch block)   4. Dyspnea on exertion    PLAN:    In order of problems listed above:  1. The patient appears to be clinically stable.  He does have [place] Heart Association functional class II symptoms of exertional dyspnea.  At the time of his last echo assessment in 2018, he was noted to have normal function of his aortic valve bioprosthesis.  We will repeat his echocardiogram for further evaluation of shortness of breath. 2. The patient is stable with no symptoms of angina.  He remains on aspirin for antiplatelet therapy and a high intensity statin drug. 3. Unchanged from previous EKGs 4. Check echo as above.  Exam is unremarkable with clear lung fields.  No evidence of volume overload.   Medication Adjustments/Labs and Tests Ordered: Current medicines are reviewed at length with the patient today.  Concerns regarding medicines are outlined above.  No orders of the defined types were placed in this encounter.  No orders of the defined types were placed in this encounter.   Patient Instructions  Medication Instructions:  Your provider recommends that you continue on your current medications as directed. Please refer to the Current Medication list given to you today.   *If you need a refill on your cardiac medications before your next appointment, please call your pharmacy*  Testing/Procedures: Your provider has requested that you have an echocardiogram. Echocardiography is a painless test that uses sound waves to create images of your heart. It provides your doctor with information about the size and shape of your heart and how well your heart's chambers and valves are working. This procedure takes approximately one hour. There are no restrictions for this  procedure.  Follow-Up: At [facility], you and your health needs are our priority.  As part of our continuing mission to provide you with exceptional heart care, we have created designated Provider Care Teams.  These Care Teams  include your primary Cardiologist (physician) and Advanced Practice Providers (APPs -  Physician Assistants and Nurse Practitioners) who all work together to provide you with the care you need, when you need it. Your next appointment:   12 month(s) The format for your next appointment:   In Person Provider:   You may see [name], MD or one of the following Advanced Practice Providers on your designated Care Team:    Richardson Dopp, PA-C  Vin [place], PA-C  Daune Perch, [place]    Signed, [name], MD  09/16/2019 2:59 PM    [place]

## 2019-10-04 ENCOUNTER — Other Ambulatory Visit: Payer: Self-pay

## 2019-10-04 ENCOUNTER — Ambulatory Visit (HOSPITAL_COMMUNITY): Payer: BC Managed Care – PPO | Attending: Cardiovascular Disease

## 2019-10-04 DIAGNOSIS — R0600 Dyspnea, unspecified: Secondary | ICD-10-CM

## 2019-10-04 DIAGNOSIS — R0609 Other forms of dyspnea: Secondary | ICD-10-CM

## 2019-10-12 DIAGNOSIS — M19111 Post-traumatic osteoarthritis, right shoulder: Secondary | ICD-10-CM | POA: Diagnosis not present

## 2019-10-12 DIAGNOSIS — M25511 Pain in right shoulder: Secondary | ICD-10-CM | POA: Diagnosis not present

## 2019-11-26 ENCOUNTER — Other Ambulatory Visit: Payer: Self-pay | Admitting: Physician Assistant

## 2020-03-27 DIAGNOSIS — L03113 Cellulitis of right upper limb: Secondary | ICD-10-CM | POA: Diagnosis not present

## 2020-08-03 DIAGNOSIS — Z125 Encounter for screening for malignant neoplasm of prostate: Secondary | ICD-10-CM | POA: Diagnosis not present

## 2020-08-03 DIAGNOSIS — I10 Essential (primary) hypertension: Secondary | ICD-10-CM | POA: Diagnosis not present

## 2020-08-03 DIAGNOSIS — E291 Testicular hypofunction: Secondary | ICD-10-CM | POA: Diagnosis not present

## 2020-08-03 DIAGNOSIS — E785 Hyperlipidemia, unspecified: Secondary | ICD-10-CM | POA: Diagnosis not present

## 2020-08-03 DIAGNOSIS — Z0000 Encounter for general adult medical examination without abnormal findings: Secondary | ICD-10-CM | POA: Diagnosis not present

## 2020-08-03 DIAGNOSIS — E039 Hypothyroidism, unspecified: Secondary | ICD-10-CM | POA: Diagnosis not present

## 2020-08-10 DIAGNOSIS — Z1331 Encounter for screening for depression: Secondary | ICD-10-CM | POA: Diagnosis not present

## 2020-08-10 DIAGNOSIS — R82998 Other abnormal findings in urine: Secondary | ICD-10-CM | POA: Diagnosis not present

## 2020-08-10 DIAGNOSIS — I10 Essential (primary) hypertension: Secondary | ICD-10-CM | POA: Diagnosis not present

## 2020-08-10 DIAGNOSIS — Z23 Encounter for immunization: Secondary | ICD-10-CM | POA: Diagnosis not present

## 2020-08-10 DIAGNOSIS — Z0000 Encounter for general adult medical examination without abnormal findings: Secondary | ICD-10-CM | POA: Diagnosis not present

## 2020-09-11 DIAGNOSIS — Z1212 Encounter for screening for malignant neoplasm of rectum: Secondary | ICD-10-CM | POA: Diagnosis not present

## 2020-09-17 ENCOUNTER — Ambulatory Visit: Payer: BC Managed Care – PPO | Admitting: Physician Assistant

## 2020-09-20 ENCOUNTER — Telehealth: Payer: Self-pay | Admitting: Cardiovascular Disease

## 2020-09-20 DIAGNOSIS — R051 Acute cough: Secondary | ICD-10-CM | POA: Diagnosis not present

## 2020-09-20 DIAGNOSIS — R0981 Nasal congestion: Secondary | ICD-10-CM | POA: Diagnosis not present

## 2020-09-20 DIAGNOSIS — J189 Pneumonia, unspecified organism: Secondary | ICD-10-CM | POA: Diagnosis not present

## 2020-09-20 DIAGNOSIS — R509 Fever, unspecified: Secondary | ICD-10-CM | POA: Diagnosis not present

## 2020-09-20 DIAGNOSIS — Z20828 Contact with and (suspected) exposure to other viral communicable diseases: Secondary | ICD-10-CM | POA: Diagnosis not present

## 2020-09-20 DIAGNOSIS — R0789 Other chest pain: Secondary | ICD-10-CM | POA: Diagnosis not present

## 2020-09-20 NOTE — Telephone Encounter (Signed)
James Wilkins from [facility] needs the patient's most recent EKG faxed to their office to compare with the EKG they did in the office today with him. He is currently in the office. Fax number [phone] with ATTN to [facility].

## 2020-09-20 NOTE — Progress Notes (Addendum)
Cardiology Office Note:    Date:  09/21/2020   ID:  James Wilkins, DOB 05/12/1961, MRN [id number]  PCP:  Marton Redwood, MD  [facility] Cardiologist:  [name], MD  [facility] Electrophysiologist:  None   Referring MD: Marton Redwood, MD   Chief Complaint:  Follow-up (CAD, hx of AVR+CABG)    Patient Profile:    James Wilkins is a 59 y.o. male with:   Coronary artery disease, aortic stenosis  S/p CABG + bioprosthetic AVR 08/2017  Hypertension  Hyperlipidemia  LBBB  GERD  Prior CV studies:   Echocardiogram 10/04/2019 Normally functioning AVR with mean gradient 7 mmHg, EF 55-60, mild LVH, GR 1 DD, no RWMA, normal RVSF, trivial MR, ascending aorta 41 mm, RVSP 29.4  Echocardiogram 09/24/2017 EF 60-65, normal wall motion, grade 2 diastolic dysfunction, normally functioning bioprosthetic AVR with mean gradient 10/peak 18, mild MR  Carotid US 08/21/2017 Bilateral ICA 1-39  Cardiac catheterization 08/12/2017 LAD 85 LCx ostial 90, 80 RCA 40  Echocardiogram 07/10/2017 EF 55-60, normal wall motion, normal diastolic function, bicuspid aortic valve with severe aortic stenosis (mean 45, peak 75), mildly dilated ascending aorta (38 mm), mild TR, PASP 30    History of Present Illness:    [name] was last seen by Dr. Burt Knack in December 2020.  He noted symptoms of shortness of breath with exertion.  A follow-up echocardiogram demonstrated normal EF with mild diastolic dysfunction and a normally functioning aortic valve prosthesis.  He returns for follow-up.    He is here alone.  He has continued to note shortness of breath with exertion.  He has NYHA II symptoms.  Recently, he has noted a cough and congestion.  His sputum is clear/white. He has not had hemoptysis or fever.  He went to a walk in clinic in [place] yesterday and was told he has fluid in his lungs and was started on levofloxacin.  He has not had orthopnea, paroxysmal nocturnal dyspnea, syncope or chest  pain.  He feels his heart racing if he wakes suddenly.        Past Medical History:  Diagnosis Date  . Aortic stenosis   . Arthritis    "joints" (08/27/2017)  . Bicuspid aortic valve   . Celiac disease   . Coronary artery disease involving native coronary artery of native heart with angina pectoris ([place])   . Dilation of esophagus    tortuous esophagus  . Dyspnea   . Gastritis    non erosive  . GERD (gastroesophageal reflux disease)   . Heart murmur    "ever since I was little" (08/27/2017)  . Hemorrhoids   . Hiatal hernia   . HLD (hyperlipidemia)   . Hypertension   . Hypothyroidism   . S/P aortic valve replacement with bioprosthetic valve 08/25/2017   25 mm Edwards Intuity Elite bovine pericardial stented rapid deployment valve    Current Medications: Current Meds  Medication Sig  . aspirin EC 81 MG tablet Take 81 mg by mouth daily.  . hydrocortisone (ANUSOL-HC) 2.5 % rectal cream Apply 1 application topically as needed for anal itching or hemorrhoids.  Marland Kitchen levothyroxine (SYNTHROID) 75 MCG tablet Take 75 mcg by mouth daily before breakfast.  . lisinopril (PRINIVIL,ZESTRIL) 20 MG tablet Take 20 mg by mouth daily.  . metoprolol tartrate (LOPRESSOR) 25 MG tablet Take 1/2 tablet by mouth  (12.5 mg ) in the morning and 1/2 tablet at night  . rosuvastatin (CRESTOR) 20 MG tablet Take 1 tablet by  mouth at bedtime.     Allergies:   Wheat bran   Social History   Tobacco Use  . Smoking status: Never Smoker  . Smokeless tobacco: Never Used  Vaping Use  . Vaping Use: Never used  Substance Use Topics  . Alcohol use: No  . Drug use: No     Family Hx: The patient's family history includes Brain cancer in his father; Cancer in an other family member; Colon polyps in his sister; Diabetes in his father; Heart disease in his father and mother; Hypertension in an other family member. There is no history of Colon cancer, Kidney disease, Esophageal cancer, Stomach cancer, or Rectal  cancer.  Review of Systems  Constitutional: Negative for fever.  Respiratory: Positive for cough. Negative for hemoptysis.   Gastrointestinal: Negative for diarrhea, hematochezia, melena and vomiting.  Genitourinary: Negative for hematuria.     EKGs/Labs/Other Test Reviewed:    EKG:  EKG is   ordered today.  The ekg ordered today demonstrates normal sinus rhythm, HR 74, LBBB, QTc 457.  Recent Labs: No results found for requested labs within last 8760 hours.   Recent Lipid Panel No results found for: CHOL, TRIG, HDL, CHOLHDL, LDLCALC, LDLDIRECT    Risk Assessment/Calculations:      Physical Exam:    VS:  BP (!) 150/82   Pulse 74   Ht 5' 6"  (1.676 m)   Wt 182 lb (82.6 kg)   SpO2 97%   BMI 29.38 kg/m     Wt Readings from Last 3 Encounters:  09/21/20 182 lb (82.6 kg)  09/16/19 173 lb (78.5 kg)  09/08/18 174 lb 3.2 oz (79 kg)     Constitutional:      Appearance: Healthy appearance. Not in distress.  Neck:     Vascular: No JVR. JVD normal.  Pulmonary:     Effort: Pulmonary effort is normal.     Breath sounds: No wheezing. Bibasilar Rales (faint, exp) present.  Cardiovascular:     Normal rate. Regular rhythm. Normal S1. Normal S2.     Murmurs: There is a grade 2/6 crescendo-decrescendo systolic murmur at the URSB.  Edema:    Peripheral edema absent.  Abdominal:     General: There is no distension.     Palpations: Abdomen is soft. There is no hepatomegaly.  Skin:    General: Skin is warm and dry.  Neurological:     General: No focal deficit present.     Mental Status: Alert and oriented to person, place and time.     Cranial Nerves: Cranial nerves are intact.       ASSESSMENT & PLAN:    1. Shortness of breath He has symptoms of cough and congestion.  His abnormal lung exam is likely related to pneumonia.  He had an abnormal CXR yesterday.  He does not display any signs of volume excess on exam.  His neck veins are flat and he has no edema.  His weight is up  9 lbs since last year but he has not orthopnea.  I do not think he has CHF. But I will obtain a BNP today and request his CXR report.  If his BNP is elevated, I will put him on Furosemide and repeat his echocardiogram.  I have advised him to start the antibiotic and f/u with his PCP next week.   2. Coronary artery disease involving native coronary artery of native heart without angina pectoris S/p CABG.  He is not having angina.  Continue  ASA, statin.    3. Aortic valve stenosis, etiology of cardiac valve disease unspecified 4. S/P AVR AVR was functioning normally on echocardiogram last year.  If his BNP is high, I will repeat his echocardiogram  5. Essential hypertension BP elevated today.  This may be due to his acute illness.  His BP is usually optimal at home. Continue to monitor.  Continue current medical regimen.    6. Mixed hyperlipidemia Continue high intensity statin.  Request recent labs from PCP.    7. LBBB (left bundle branch block) This has been noted on several prior ECGs.           Dispo:  Return in about 4 weeks (around 10/19/2020) for Close Follow Up, w/ Dr. Burt Knack, or Richardson Dopp, PA-C, in person.   Medication Adjustments/Labs and Tests Ordered: Current medicines are reviewed at length with the patient today.  Concerns regarding medicines are outlined above.  Tests Ordered: Orders Placed This Encounter  Procedures  . Pro b natriuretic peptide (BNP)  . EKG 12-Lead   Medication Changes: No orders of the defined types were placed in this encounter.   Signed, Richardson Dopp, PA-C  09/21/2020 1:15 PM    [facility] [place], [place], [place]  [address] Phone: [phone]; Fax: [phone]    ADDENDUM CXR report from 09/20/20 done at [facility] in [place] received.   Impression: Patchy LLL infiltrate. CABG. Prosthetic aortic valve.  Labs from PCP 08/03/2020: TC 137, TG 69, HDL 53, LDL 70 We can review his lipids at  follow-up visit Richardson Dopp, PA-C    10/08/2020 4:22 PM

## 2020-09-21 ENCOUNTER — Other Ambulatory Visit: Payer: Self-pay

## 2020-09-21 ENCOUNTER — Encounter: Payer: Self-pay | Admitting: Physician Assistant

## 2020-09-21 ENCOUNTER — Ambulatory Visit: Payer: BC Managed Care – PPO | Admitting: Physician Assistant

## 2020-09-21 VITALS — BP 150/82 | HR 74 | Ht 66.0 in | Wt 182.0 lb

## 2020-09-21 DIAGNOSIS — I2510 Atherosclerotic heart disease of native coronary artery without angina pectoris: Secondary | ICD-10-CM | POA: Diagnosis not present

## 2020-09-21 DIAGNOSIS — I350 Nonrheumatic aortic (valve) stenosis: Secondary | ICD-10-CM | POA: Diagnosis not present

## 2020-09-21 DIAGNOSIS — I10 Essential (primary) hypertension: Secondary | ICD-10-CM

## 2020-09-21 DIAGNOSIS — I447 Left bundle-branch block, unspecified: Secondary | ICD-10-CM

## 2020-09-21 DIAGNOSIS — Z952 Presence of prosthetic heart valve: Secondary | ICD-10-CM

## 2020-09-21 DIAGNOSIS — E782 Mixed hyperlipidemia: Secondary | ICD-10-CM

## 2020-09-21 DIAGNOSIS — R0602 Shortness of breath: Secondary | ICD-10-CM

## 2020-09-21 NOTE — Patient Instructions (Addendum)
Medication Instructions:  Your physician recommends that you continue on your current medications as directed. Please refer to the Current Medication list given to you today.  *If you need a refill on your cardiac medications before your next appointment, please call your pharmacy*  Lab Work: You will have labs drawn today: BNP  If you have labs (blood work) drawn today and your tests are completely normal, you will receive your results only by: Marland Kitchen MyChart Message (if you have MyChart) OR . A paper copy in the mail If you have any lab test that is abnormal or we need to change your treatment, we will call you to review the results.  Testing/Procedures: None ordered today  Follow-Up: On 10/22/2020 at 11:45AM with Richardson Dopp, PA-C

## 2020-09-22 LAB — PRO B NATRIURETIC PEPTIDE: NT-Pro BNP: 81 pg/mL (ref 0–210)

## 2020-10-21 NOTE — Progress Notes (Deleted)
{Choose 1 Note Type (Video or Telephone):[phone]}    Date:  10/21/2020   ID:  [name], DOB May 26, 1961, MRN [id number] The patient was identified using 2 identifiers.  {Patient Location:[phone]::"Home"} {Provider [phone]::"Home Office"}  PCP:  Marton [facility], MD  Cardiologist:  [name], MD *** Electrophysiologist:  None   Evaluation Performed:  {Choose Visit Type:[phone]::"Follow-Up Visit"}  Chief Complaint:  ***  Patient Profile: James Wilkins is a 60 y.o. male with:  Coronary artery disease, aortic stenosis ? S/p CABG + bioprosthetic AVR 08/2017  Hypertension  Hyperlipidemia  LBBB  GERD  Prior CV studies:   Echocardiogram 10/04/2019 Normally functioning AVR with mean gradient 7 mmHg, EF 55-60, mild LVH, GR 1 DD, no RWMA, normal RVSF, trivial MR, ascending aorta 41 mm, RVSP 29.4  Echocardiogram 09/24/2017 EF 60-65, normal wall motion, grade 2 diastolic dysfunction, normally functioning bioprosthetic AVR with mean gradient 10/peak 18, mild MR  Carotid US 08/21/2017 Bilateral ICA 1-39  Cardiac catheterization 08/12/2017 LAD 85 LCx ostial 90, 80 RCA 40  Echocardiogram 07/10/2017 EF 55-60, normal wall motion, normal diastolic function, bicuspid aortic valve with severe aortic stenosis (mean 45, peak 75), mildly dilated ascending aorta (38 mm), mild TR, PASP 30   History of Present Illness:   Mr. Spellman was last seen in 12/21.  He had been to an urgent care prior to this and dx with pneumonia.  He had been placed on antibiotics but had not started them until he saw Cardiology.  I obtained a NT-Pro BNP and this was normal.  Records received from urgent care/PCP: CXR report from 09/20/20 done at [facility] in [place] received.   Impression: Patchy LLL infiltrate. CABG. Prosthetic aortic valve.  Labs from PCP 08/03/2020: TC 137, TG 69, HDL 53, LDL 70   He is seen for f/u.    ***    Past Medical History:  Diagnosis Date  .  Aortic stenosis   . Arthritis    "joints" (08/27/2017)  . Bicuspid aortic valve   . Celiac disease   . Coronary artery disease involving native coronary artery of native heart with angina pectoris ([facility])   . Dilation of esophagus    tortuous esophagus  . Dyspnea   . Gastritis    non erosive  . GERD (gastroesophageal reflux disease)   . Heart murmur    "ever since I was little" (08/27/2017)  . Hemorrhoids   . Hiatal hernia   . HLD (hyperlipidemia)   . Hypertension   . Hypothyroidism   . S/P aortic valve replacement with bioprosthetic valve 08/25/2017   25 mm Edwards Intuity Elite bovine pericardial stented rapid deployment valve   Past Surgical History:  Procedure Laterality Date  . AORTIC VALVE REPLACEMENT N/A 08/25/2017   Procedure: AORTIC VALVE REPLACEMENT (AVR);  Surgeon: Rexene Alberts, MD;  Location: [place];  Service: Open Heart Surgery;  Laterality: N/A;  . AORTIC VALVE REPLACEMENT (AVR)/CORONARY ARTERY BYPASS GRAFTING (CABG)  08/25/2017   25 mm Edwards Intuity Elite bovine pericardial stented rapid deployment valve  . BACK SURGERY    . BLADDER SURGERY     "weak spots in my bladder; they took them out"  . CARDIAC CATHETERIZATION  08/12/2017  . CARDIAC VALVE REPLACEMENT    . COLONOSCOPY  2009  . CORONARY ANGIOPLASTY    . CORONARY ARTERY BYPASS GRAFT  08/25/2017   LIMA to LAD, SVG to [place] Intermediate, EVH via left thigh  . CORONARY ARTERY BYPASS GRAFT N/A 08/25/2017   Procedure:  CORONARY ARTERY BYPASS GRAFTING (CABG) x two, using left internal mammary artery and left leg greater saphenous vein harvested endoscopically;  Surgeon: Rexene Alberts, MD;  Location: [facility];  Service: Open Heart Surgery;  Laterality: N/A;  . ESOPHAGOGASTRODUODENOSCOPY (EGD) WITH ESOPHAGEAL DILATION  2008  . [place]; 2016 X 2  . SHOULDER ARTHROSCOPY W/ ROTATOR CUFF REPAIR Right 2012  . SHOULDER ARTHROSCOPY W/ ROTATOR CUFF REPAIR Left 2014 X 2  . TEE WITHOUT CARDIOVERSION N/A  08/25/2017   Procedure: TRANSESOPHAGEAL ECHOCARDIOGRAM (TEE);  Surgeon: Rexene Alberts, MD;  Location: [facility];  Service: Open Heart Surgery;  Laterality: N/A;  . UPPER GASTROINTESTINAL ENDOSCOPY       No outpatient medications have been marked as taking for the 10/22/20 encounter (Appointment) with Richardson Dopp T, PA-C.     Allergies:   Wheat bran   Social History   Tobacco Use  . Smoking status: Never Smoker  . Smokeless tobacco: Never Used  Vaping Use  . Vaping Use: Never used  Substance Use Topics  . Alcohol use: No  . Drug use: No     Family Hx: The patient's family history includes Brain cancer in his father; Cancer in an other family member; Colon polyps in his sister; Diabetes in his father; Heart disease in his father and mother; Hypertension in an other family member. There is no history of Colon cancer, Kidney disease, Esophageal cancer, Stomach cancer, or Rectal cancer.  ROS:   Please see the history of present illness.    *** All other systems reviewed and are negative.   Prior CV studies:   The following studies were reviewed today:  ***  Labs/Other Tests and Data Reviewed:    EKG:  {EKG/Telemetry Strips [phone]}  Recent Labs: 09/21/2020: NT-Pro BNP 81   Recent Lipid Panel No results found for: CHOL, TRIG, HDL, CHOLHDL, LDLCALC, LDLDIRECT  Wt Readings from Last 3 Encounters:  09/21/20 182 lb (82.6 kg)  09/16/19 173 lb (78.5 kg)  09/08/18 174 lb 3.2 oz (79 kg)     Risk Assessment/Calculations:   {Does this patient have ATRIAL FIBRILLATION?:[phone]}  Objective:    Vital Signs:  There were no vitals taken for this visit.   {HeartCare Virtual Exam (Optional):[phone]::"VITAL SIGNS:  reviewed"}  ASSESSMENT & PLAN:    1. ***  1. Shortness of breath He has symptoms of cough and congestion.  His abnormal lung exam is likely related to pneumonia.  He had an abnormal CXR yesterday.  He does not display any signs of volume excess  on exam.  His neck veins are flat and he has no edema.  His weight is up 9 lbs since last year but he has not orthopnea.  I do not think he has CHF. But I will obtain a BNP today and request his CXR report.  If his BNP is elevated, I will put him on Furosemide and repeat his echocardiogram.  I have advised him to start the antibiotic and f/u with his PCP next week.   2. Coronary artery disease involving native coronary artery of native heart without angina pectoris S/p CABG.  He is not having angina.  Continue ASA, statin.    3. Aortic valve stenosis, etiology of cardiac valve disease unspecified 4. S/P AVR AVR was functioning normally on echocardiogram last year.  If his BNP is high, I will repeat his echocardiogram  5. Essential hypertension BP elevated today.  This may be due to his acute illness.  His BP is usually optimal at home. Continue to monitor.  Continue current medical regimen.    6. Mixed hyperlipidemia Continue high intensity statin.  Request recent labs from PCP.    7. LBBB (left bundle branch block) This has been noted on several prior ECGs.   {Are you ordering a CV Procedure (e.g. stress test, cath, DCCV, TEE, etc)?   Press F2        :[id number]}    COVID-19 Education: The signs and symptoms of COVID-19 were discussed with the patient and how to seek care for testing (follow up with PCP or arrange E-visit).  ***The importance of social distancing was discussed today.  Time:   Today, I have spent *** minutes with the patient with telehealth technology discussing the above problems.     Medication Adjustments/Labs and Tests Ordered: Current medicines are reviewed at length with the patient today.  Concerns regarding medicines are outlined above.   Tests Ordered: No orders of the defined types were placed in this encounter.   Medication Changes: No orders of the defined types were placed in this encounter.   Follow Up:  {F/U [phone]} {follow  up:[id number]}  Signed, Richardson Dopp, PA-C  10/21/2020 11:56 AM    [facility]

## 2020-10-22 ENCOUNTER — Other Ambulatory Visit: Payer: Self-pay

## 2020-10-22 ENCOUNTER — Telehealth: Payer: BC Managed Care – PPO | Admitting: Physician Assistant

## 2020-10-22 DIAGNOSIS — I2510 Atherosclerotic heart disease of native coronary artery without angina pectoris: Secondary | ICD-10-CM

## 2020-10-22 DIAGNOSIS — E782 Mixed hyperlipidemia: Secondary | ICD-10-CM

## 2020-10-22 DIAGNOSIS — Z952 Presence of prosthetic heart valve: Secondary | ICD-10-CM

## 2020-10-22 DIAGNOSIS — I10 Essential (primary) hypertension: Secondary | ICD-10-CM

## 2020-10-25 DIAGNOSIS — M25511 Pain in right shoulder: Secondary | ICD-10-CM | POA: Diagnosis not present

## 2020-10-25 DIAGNOSIS — M13821 Other specified arthritis, right elbow: Secondary | ICD-10-CM | POA: Diagnosis not present

## 2020-12-24 DIAGNOSIS — M961 Postlaminectomy syndrome, not elsewhere classified: Secondary | ICD-10-CM | POA: Diagnosis not present

## 2020-12-24 DIAGNOSIS — M5136 Other intervertebral disc degeneration, lumbar region: Secondary | ICD-10-CM | POA: Diagnosis not present

## 2021-01-08 DIAGNOSIS — M5416 Radiculopathy, lumbar region: Secondary | ICD-10-CM | POA: Diagnosis not present

## 2021-01-14 DIAGNOSIS — M5459 Other low back pain: Secondary | ICD-10-CM | POA: Diagnosis not present

## 2021-02-07 DIAGNOSIS — M5416 Radiculopathy, lumbar region: Secondary | ICD-10-CM | POA: Diagnosis not present

## 2021-02-19 ENCOUNTER — Other Ambulatory Visit: Payer: Self-pay | Admitting: Cardiovascular Disease

## 2021-02-22 DIAGNOSIS — M545 Low back pain, unspecified: Secondary | ICD-10-CM | POA: Diagnosis not present

## 2021-03-30 DIAGNOSIS — Z20828 Contact with and (suspected) exposure to other viral communicable diseases: Secondary | ICD-10-CM | POA: Diagnosis not present

## 2021-03-30 DIAGNOSIS — R051 Acute cough: Secondary | ICD-10-CM | POA: Diagnosis not present

## 2021-03-30 DIAGNOSIS — J069 Acute upper respiratory infection, unspecified: Secondary | ICD-10-CM | POA: Diagnosis not present

## 2021-03-30 DIAGNOSIS — R0981 Nasal congestion: Secondary | ICD-10-CM | POA: Diagnosis not present

## 2021-04-12 ENCOUNTER — Telehealth: Payer: Self-pay | Admitting: Cardiovascular Disease

## 2021-04-12 ENCOUNTER — Encounter: Payer: Self-pay | Admitting: Cardiovascular Disease

## 2021-04-12 NOTE — Telephone Encounter (Signed)
Attempted phone call to pt and left voicemail message to contact RN at [phone]. 

## 2021-04-12 NOTE — Telephone Encounter (Signed)
Error

## 2021-04-12 NOTE — Telephone Encounter (Signed)
Per patient schedule message:  "resceduled visit from Jan 2022, I have some swelling in lower legs that I think need to be addressed, I would like a Friday appt anytime after 11am."  Unable to obtain additional information. Left message for patient to call back.

## 2021-04-16 NOTE — Telephone Encounter (Signed)
Lm to call back ./cy 

## 2021-04-17 ENCOUNTER — Telehealth: Payer: Self-pay | Admitting: Cardiovascular Disease

## 2021-04-17 NOTE — Telephone Encounter (Signed)
Another message came in from pt regarding his swelling. See phone note from 04/12/21:   "  I talked with James Wilkins he states that the swelling is not that bad that he needs to come in, so if we could just go ahead and schedule an appt for his yearly follow-up which should be sometime around November or December with Dr Burt Knack or Richardson Dopp so that we can get on the calendar at least.  I tried calling the number to return James Wilkins's phone call and on hold for over 7 minutes could not wait any longer.  Thank you and have a great day.        James Wilkins"

## 2021-04-17 NOTE — Telephone Encounter (Signed)
Left message for patient to call back. He is scheduled to see Richardson Dopp, PA on 10/7 at 1015. Advised to call back if he needs to reschedule.

## 2021-05-30 DIAGNOSIS — R112 Nausea with vomiting, unspecified: Secondary | ICD-10-CM | POA: Diagnosis not present

## 2021-05-30 DIAGNOSIS — Z20828 Contact with and (suspected) exposure to other viral communicable diseases: Secondary | ICD-10-CM | POA: Diagnosis not present

## 2021-05-30 DIAGNOSIS — R1084 Generalized abdominal pain: Secondary | ICD-10-CM | POA: Diagnosis not present

## 2021-05-30 DIAGNOSIS — R509 Fever, unspecified: Secondary | ICD-10-CM | POA: Diagnosis not present

## 2021-07-08 DIAGNOSIS — M25512 Pain in left shoulder: Secondary | ICD-10-CM | POA: Diagnosis not present

## 2021-07-08 DIAGNOSIS — M25811 Other specified joint disorders, right shoulder: Secondary | ICD-10-CM | POA: Diagnosis not present

## 2021-07-08 DIAGNOSIS — M25511 Pain in right shoulder: Secondary | ICD-10-CM | POA: Diagnosis not present

## 2021-07-11 NOTE — Progress Notes (Signed)
Cardiology Office Note:    Date:  07/12/2021   ID:  James Wilkins, DOB 1961-01-19, MRN [id number]  PCP:  Ginger Organ., MD   [facility] Providers Cardiologist:  [name], MD Cardiology APP:  Sharm[facility]     Referring MD: Ginger Organ., MD   Chief Complaint:  F/u for CAD, hx of AVR    Patient Profile:   James Wilkins is a 60 y.o. male with:  Coronary artery disease, aortic stenosis S/p CABG + bioprosthetic AVR 08/2017 Hypertension Hyperlipidemia LBBB GERD   Prior CV studies: Echocardiogram 10/04/2019 Normally functioning AVR with mean gradient 7 mmHg, EF 55-60, mild LVH, GR 1 DD, no RWMA, normal RVSF, trivial MR, ascending aorta 41 mm, RVSP 29.4   Echocardiogram 09/24/2017 EF 60-65, normal wall motion, grade 2 diastolic dysfunction, normally functioning bioprosthetic AVR with mean gradient 10/peak 18, mild MR   Carotid US 08/21/2017 Bilateral ICA 1-39   Cardiac catheterization 08/12/2017 LAD 85 LCx ostial 90, 80 RCA 40    History of Present Illness: James Wilkins was last seen in 12/21.  He returns for follow-up.  He is here alone.  He continues to have issues with shortness of breath.  He also notes leg weakness.  Overall, his shortness of breath is stable.  He has some discomfort in his legs.  He notes some calf pain at times.  He was seen by his orthopedist and was told to follow-up with cardiology to evaluate for vascular issues.  He has not had chest pain, syncope, orthopnea.  He does have mild leg edema.  This is worse on the left than the right.        Past Medical History:  Diagnosis Date   Aortic stenosis    Arthritis    "joints" (08/27/2017)   Bicuspid aortic valve    Celiac disease    Coronary artery disease involving native coronary artery of native heart with angina pectoris ([facility])    Dilation of esophagus    tortuous esophagus   Dyspnea    Gastritis    non erosive   GERD (gastroesophageal reflux disease)    Heart murmur     "ever since I was little" (08/27/2017)   Hemorrhoids    Hiatal hernia    HLD (hyperlipidemia)    Hypertension    Hypothyroidism    S/P aortic valve replacement with bioprosthetic valve 08/25/2017   25 mm Edwards Intuity Elite bovine pericardial stented rapid deployment valve   Current Medications: Current Meds  Medication Sig   aspirin EC 81 MG tablet Take 81 mg by mouth daily.   GABAPENTIN EX Take 300 mg by mouth at bedtime.   hydrochlorothiazide (MICROZIDE) 12.5 MG capsule Take 1 capsule (12.5 mg total) by mouth daily.   hydrocortisone (ANUSOL-HC) 2.5 % rectal cream Apply 1 application topically as needed for anal itching or hemorrhoids.   levothyroxine (SYNTHROID) 75 MCG tablet Take 75 mcg by mouth daily before breakfast.   lisinopril (PRINIVIL,ZESTRIL) 20 MG tablet Take 20 mg by mouth daily.   metoprolol tartrate (LOPRESSOR) 25 MG tablet TAKE 1 TABLET BY MOUTH TWICE DAILY   rosuvastatin (CRESTOR) 20 MG tablet Take 1 tablet by mouth at bedtime.    Allergies:   Wheat bran   Social History   Tobacco Use   Smoking status: Never   Smokeless tobacco: Never  Vaping Use   Vaping Use: Never used  Substance Use Topics   Alcohol use: No  Drug use: No    Family Hx: The patient's family history includes Brain cancer in his father; Cancer in an other family member; Colon polyps in his sister; Diabetes in his father; Heart disease in his father and mother; Hypertension in an other family member. There is no history of Colon cancer, Kidney disease, Esophageal cancer, Stomach cancer, or Rectal cancer.  Review of Systems  Gastrointestinal:  Hematochezia: hemorrhoidal bleeding.  Genitourinary:  Negative for hematuria.    EKGs/Labs/Other Test Reviewed:    EKG:  EKG is not ordered today.  The ekg ordered today demonstrates n/a  Recent Labs: 09/21/2020: NT-Pro BNP 81   Recent Lipid Panel No results found for: CHOL, TRIG, HDL, LDLCALC, LDLDIRECT   Risk Assessment/Calculations:           Physical Exam:    VS:  BP (!) 164/80   Pulse 63   Ht _0  (1.651 m)   Wt 181 lb (82.1 kg)   SpO2 97%   BMI 30.12 kg/m     Wt Readings from Last 3 Encounters:  07/12/21 181 lb (82.1 kg)  09/21/20 182 lb (82.6 kg)  09/16/19 173 lb (78.5 kg)    Constitutional:      Appearance: Healthy appearance. Not in distress.  Neck:     Vascular: JVD normal.  Pulmonary:     Effort: Pulmonary effort is normal.     Breath sounds: No wheezing. No rales.  Cardiovascular:     Normal rate. Regular rhythm. Normal S1. Normal S2.      Murmurs: There is a grade 2/6 systolic murmur at the URSB and ULSB.  Pulses:    Comments: DP/PT difficult to palpate  Edema:    Peripheral edema present.    Pretibial: bilateral trace edema of the pretibial area. Abdominal:     Palpations: Abdomen is soft.  Skin:    General: Skin is warm and dry.  Neurological:     General: No focal deficit present.     Mental Status: Alert and oriented to person, place and time.     Cranial Nerves: Cranial nerves are intact.       ASSESSMENT & PLAN:   1. Coronary artery disease involving native coronary artery of native heart without angina pectoris Status post CABG in 2018.  He is not having chest discomfort to suggest angina.  Continue aspirin 81 mg daily, metoprolol tartrate 25 mg twice daily, rosuvastatin 20 mg daily.  2. Aortic valve stenosis, etiology of cardiac valve disease unspecified 3. S/P AVR 4. Shortness of breath Last echocardiogram in 2020 demonstrated normally functioning AVR with normal EF.  I suspect his shortness of breath is related to deconditioning.  He does not have any evidence of volume excess on exam.  Since it has been almost 2 years since his last echocardiogram, I will arrange a follow-up echocardiogram.  5. Essential hypertension Blood pressure is elevated.  He does admit to a high salt diet.  Have asked him to reduce his sodium intake.  I will place him on hydrochlorothiazide 12.5 mg  once daily.  Continue lisinopril 20 mg daily, metoprolol tartrate 25 mg twice daily.  Obtain follow-up CMET in 1 week.  I will see him back in 3 months.  6. Mixed hyperlipidemia LDL close to goal last year.  Continue rosuvastatin 20 mg daily.  Obtain fasting CMET, lipids in 1 week.  7. Claudication [facility]) He has bilateral leg discomfort with exertion.  Sometimes he feels this in his joints.  Sometimes he  feels it in his calves.  I have difficulty feeling his pulses in his feet.  Therefore, I will arrange ABIs and arterial Dopplers to assess for significant PAD.   Dispo:  Return in about 3 months (around 10/12/2021) for Routine Follow Up, w/ Richardson Dopp, PA-C.   Medication Adjustments/Labs and Tests Ordered: Current medicines are reviewed at length with the patient today.  Concerns regarding medicines are outlined above.  Tests Ordered: Orders Placed This Encounter  Procedures   Comp Met (CMET)   Lipid Profile   ECHOCARDIOGRAM COMPLETE   VAS [place] ABI WITH/WO TBI   VAS [place] LOWER EXTREMITY ARTERIAL DUPLEX    Medication Changes: Meds ordered this encounter  Medications   hydrochlorothiazide (MICROZIDE) 12.5 MG capsule    Sig: Take 1 capsule (12.5 mg total) by mouth daily.    Dispense:  30 capsule    Refill:  [address], Richardson Dopp, PA-C  07/12/2021 11:21 AM    [facility] [place], [place], [place]  [address] Phone: [phone]; Fax: [phone]

## 2021-07-12 ENCOUNTER — Ambulatory Visit: Payer: BC Managed Care – PPO | Admitting: Physician Assistant

## 2021-07-12 ENCOUNTER — Other Ambulatory Visit: Payer: Self-pay

## 2021-07-12 ENCOUNTER — Encounter: Payer: Self-pay | Admitting: Physician Assistant

## 2021-07-12 ENCOUNTER — Other Ambulatory Visit: Payer: Self-pay | Admitting: *Deleted

## 2021-07-12 VITALS — BP 164/80 | HR 63 | Ht 65.0 in | Wt 181.0 lb

## 2021-07-12 DIAGNOSIS — Z952 Presence of prosthetic heart valve: Secondary | ICD-10-CM

## 2021-07-12 DIAGNOSIS — E782 Mixed hyperlipidemia: Secondary | ICD-10-CM

## 2021-07-12 DIAGNOSIS — I10 Essential (primary) hypertension: Secondary | ICD-10-CM

## 2021-07-12 DIAGNOSIS — I2510 Atherosclerotic heart disease of native coronary artery without angina pectoris: Secondary | ICD-10-CM

## 2021-07-12 DIAGNOSIS — R0602 Shortness of breath: Secondary | ICD-10-CM | POA: Diagnosis not present

## 2021-07-12 DIAGNOSIS — I350 Nonrheumatic aortic (valve) stenosis: Secondary | ICD-10-CM

## 2021-07-12 DIAGNOSIS — I739 Peripheral vascular disease, unspecified: Secondary | ICD-10-CM

## 2021-07-12 DIAGNOSIS — I447 Left bundle-branch block, unspecified: Secondary | ICD-10-CM

## 2021-07-12 MED ORDER — HYDROCHLOROTHIAZIDE 12.5 MG PO CAPS: 12.5000 mg | ORAL_CAPSULE | Freq: Every day | ORAL | 11 refills | Status: DC

## 2021-07-12 NOTE — Patient Instructions (Signed)
Medication Instructions:   START HCTZ  one tablet by mouth ( 12.5 mg) daily  *If you need a refill on your cardiac medications before your next appointment, please call your pharmacy*   Lab Work: Your physician recommends that you return for a FASTING lipid profile/cmet the same day as [place] of legs at [employer] location.   If you have labs (blood work) drawn today and your tests are completely normal, you will receive your results only by: [place] (if you have MyChart) OR A paper copy in the mail If you have any lab test that is abnormal or we need to change your treatment, we will call you to review the results.   Testing/Procedures: Your physician has requested that you have a lower extremity arterial exercise duplex. During this test, exercise and ultrasound are used to evaluate arterial blood flow in the legs. Allow one hour for this exam. There are no restrictions or special instructions. Your physician has requested that you have an ankle brachial index (ABI). During this test an ultrasound and blood pressure cuff are used to evaluate the arteries that supply the arms and legs with blood. Allow thirty minutes for this exam. There are no restrictions or special instructions.  Your physician has requested that you have an echocardiogram. Echocardiography is a painless test that uses sound waves to create images of your heart. It provides your doctor with information about the size and shape of your heart and how well your heart's chambers and valves are working. This procedure takes approximately one hour. There are no restrictions for this procedure.    Follow-Up: At [facility], you and your health needs are our priority.  As part of our continuing mission to provide you with exceptional heart care, we have created designated Provider Care Teams.  These Care Teams include your primary Cardiologist (physician) and Advanced Practice Providers (APPs -  Physician Assistants and  Nurse Practitioners) who all work together to provide you with the care you need, when you need it.  We recommend signing up for the patient portal called "MyChart".  Sign up information is provided on this After Visit Summary.  MyChart is used to connect with patients for Virtual Visits (Telemedicine).  Patients are able to view lab/test results, encounter notes, upcoming appointments, etc.  Non-urgent messages can be sent to your provider as well.   To learn more about what you can do with MyChart, go to [email or web address].    Your next appointment:   3 month(s)  The format for your next appointment:   In Person  Provider:   Richardson Dopp, PA-C   Other Instructions

## 2021-07-19 ENCOUNTER — Other Ambulatory Visit: Payer: Self-pay

## 2021-07-19 ENCOUNTER — Ambulatory Visit (HOSPITAL_COMMUNITY)
Admission: RE | Admit: 2021-07-19 | Discharge: 2021-07-19 | Disposition: A | Discharge status: Home / Self Care | Payer: BC Managed Care – PPO | Source: Ambulatory Visit | Attending: Internal Medicine | Admitting: Internal Medicine

## 2021-07-19 DIAGNOSIS — I10 Essential (primary) hypertension: Secondary | ICD-10-CM

## 2021-07-19 DIAGNOSIS — E782 Mixed hyperlipidemia: Secondary | ICD-10-CM | POA: Diagnosis not present

## 2021-07-19 DIAGNOSIS — Z952 Presence of prosthetic heart valve: Secondary | ICD-10-CM | POA: Diagnosis not present

## 2021-07-19 DIAGNOSIS — I2510 Atherosclerotic heart disease of native coronary artery without angina pectoris: Secondary | ICD-10-CM

## 2021-07-19 DIAGNOSIS — R0602 Shortness of breath: Secondary | ICD-10-CM | POA: Diagnosis not present

## 2021-07-19 DIAGNOSIS — I739 Peripheral vascular disease, unspecified: Secondary | ICD-10-CM

## 2021-07-19 DIAGNOSIS — I350 Nonrheumatic aortic (valve) stenosis: Secondary | ICD-10-CM

## 2021-07-20 LAB — LIPID PANEL
Chol/HDL Ratio: 2.8 ratio (ref 0.0–5.0)
Cholesterol, Total: 165 mg/dL (ref 100–199)
HDL: 58 mg/dL (ref >39)
LDL Chol Calc (NIH): 90 mg/dL (ref 0–99)
Triglycerides: 91 mg/dL (ref 0–149)
VLDL Cholesterol Cal: 17 mg/dL (ref 5–40)

## 2021-07-20 LAB — COMPREHENSIVE METABOLIC PANEL
ALT: 29 IU/L (ref 0–44)
AST: 36 IU/L (ref 0–40)
Albumin/Globulin Ratio: 2 (ref 1.2–2.2)
Albumin: 4.7 g/dL (ref 3.8–4.8)
Alkaline Phosphatase: 89 IU/L (ref 44–121)
BUN/Creatinine Ratio: 10 (ref 10–24)
BUN: 16 mg/dL (ref 8–27)
Bilirubin Total: 0.3 mg/dL (ref 0.0–1.2)
CO2: 24 mmol/L (ref 20–29)
Calcium: 9.3 mg/dL (ref 8.6–10.2)
Chloride: 102 mmol/L (ref 96–106)
Creatinine, Ser: 1.56 mg/dL — ABNORMAL HIGH (ref 0.76–1.27)
Globulin, Total: 2.4 g/dL (ref 1.5–4.5)
Glucose: 80 mg/dL (ref 70–99)
Potassium: 4.7 mmol/L (ref 3.5–5.2)
Sodium: 139 mmol/L (ref 134–144)
Total Protein: 7.1 g/dL (ref 6.0–8.5)
eGFR: 50 mL/min/{1.73_m2} — ABNORMAL LOW (ref >59)

## 2021-07-22 ENCOUNTER — Other Ambulatory Visit: Payer: Self-pay | Admitting: Cardiovascular Disease

## 2021-07-22 ENCOUNTER — Other Ambulatory Visit: Payer: Self-pay | Admitting: *Deleted

## 2021-07-22 MED ORDER — EZETIMIBE 10 MG PO TABS: 10.0000 mg | ORAL_TABLET | Freq: Every day | ORAL | 3 refills | Status: DC

## 2021-07-23 ENCOUNTER — Other Ambulatory Visit: Payer: Self-pay | Admitting: *Deleted

## 2021-07-23 MED ORDER — METOPROLOL TARTRATE 25 MG PO TABS
25.0000 mg | ORAL_TABLET | Freq: Two times a day (BID) | ORAL | 6 refills | Status: DC
Start: 2021-07-23 — End: 2021-10-18

## 2021-07-26 ENCOUNTER — Ambulatory Visit (HOSPITAL_COMMUNITY): Payer: BC Managed Care – PPO | Attending: Cardiology

## 2021-07-26 ENCOUNTER — Other Ambulatory Visit: Payer: Self-pay

## 2021-07-26 DIAGNOSIS — I739 Peripheral vascular disease, unspecified: Secondary | ICD-10-CM | POA: Diagnosis not present

## 2021-07-26 DIAGNOSIS — I10 Essential (primary) hypertension: Secondary | ICD-10-CM

## 2021-07-26 DIAGNOSIS — Z952 Presence of prosthetic heart valve: Secondary | ICD-10-CM

## 2021-07-26 DIAGNOSIS — R0602 Shortness of breath: Secondary | ICD-10-CM

## 2021-07-26 DIAGNOSIS — I2510 Atherosclerotic heart disease of native coronary artery without angina pectoris: Secondary | ICD-10-CM

## 2021-07-26 DIAGNOSIS — E782 Mixed hyperlipidemia: Secondary | ICD-10-CM

## 2021-07-26 DIAGNOSIS — I350 Nonrheumatic aortic (valve) stenosis: Secondary | ICD-10-CM

## 2021-07-26 LAB — ECHOCARDIOGRAM COMPLETE
AV Mean grad: 8.4 mmHg
AV Peak grad: 14.5 mmHg
Ao pk vel: 1.9 m/s
Area-P 1/2: 3.08 cm2
S' Lateral: 2.9 cm

## 2021-07-28 ENCOUNTER — Encounter: Payer: Self-pay | Admitting: Physician Assistant

## 2021-07-28 DIAGNOSIS — I77810 Thoracic aortic ectasia: Secondary | ICD-10-CM | POA: Insufficient documentation

## 2021-07-28 DIAGNOSIS — I7121 Aneurysm of the ascending aorta, without rupture: Secondary | ICD-10-CM | POA: Insufficient documentation

## 2021-07-29 ENCOUNTER — Other Ambulatory Visit: Payer: Self-pay | Admitting: *Deleted

## 2021-07-29 DIAGNOSIS — I712 Thoracic aortic aneurysm, without rupture, unspecified: Secondary | ICD-10-CM

## 2021-08-02 DIAGNOSIS — M25512 Pain in left shoulder: Secondary | ICD-10-CM | POA: Diagnosis not present

## 2021-08-08 DIAGNOSIS — Z125 Encounter for screening for malignant neoplasm of prostate: Secondary | ICD-10-CM | POA: Diagnosis not present

## 2021-08-08 DIAGNOSIS — E039 Hypothyroidism, unspecified: Secondary | ICD-10-CM | POA: Diagnosis not present

## 2021-08-08 DIAGNOSIS — E785 Hyperlipidemia, unspecified: Secondary | ICD-10-CM | POA: Diagnosis not present

## 2021-08-08 DIAGNOSIS — E291 Testicular hypofunction: Secondary | ICD-10-CM | POA: Diagnosis not present

## 2021-08-15 DIAGNOSIS — I10 Essential (primary) hypertension: Secondary | ICD-10-CM | POA: Diagnosis not present

## 2021-08-15 DIAGNOSIS — Z0000 Encounter for general adult medical examination without abnormal findings: Secondary | ICD-10-CM | POA: Diagnosis not present

## 2021-08-15 DIAGNOSIS — Z1331 Encounter for screening for depression: Secondary | ICD-10-CM | POA: Diagnosis not present

## 2021-08-15 DIAGNOSIS — Z1389 Encounter for screening for other disorder: Secondary | ICD-10-CM | POA: Diagnosis not present

## 2021-08-15 DIAGNOSIS — R82998 Other abnormal findings in urine: Secondary | ICD-10-CM | POA: Diagnosis not present

## 2021-08-16 ENCOUNTER — Other Ambulatory Visit: Payer: Self-pay

## 2021-08-16 ENCOUNTER — Ambulatory Visit
Admission: RE | Admit: 2021-08-16 | Discharge: 2021-08-16 | Disposition: A | Discharge status: Home / Self Care | Payer: BC Managed Care – PPO | Source: Ambulatory Visit | Attending: Physician Assistant | Admitting: Physician Assistant

## 2021-08-16 DIAGNOSIS — I712 Thoracic aortic aneurysm, without rupture, unspecified: Secondary | ICD-10-CM

## 2021-08-16 DIAGNOSIS — I700 Atherosclerosis of aorta: Secondary | ICD-10-CM | POA: Diagnosis not present

## 2021-08-16 MED ORDER — IOPAMIDOL (ISOVUE-370) INJECTION 76%
75.0000 mL | Freq: Once | INTRAVENOUS | Status: AC | PRN
  Administered 2021-08-16: 75 mL via INTRAVENOUS

## 2021-08-21 ENCOUNTER — Encounter: Payer: Self-pay | Admitting: Physician Assistant

## 2021-08-26 ENCOUNTER — Other Ambulatory Visit: Payer: Self-pay | Admitting: *Deleted

## 2021-08-26 DIAGNOSIS — I7121 Aneurysm of the ascending aorta, without rupture: Secondary | ICD-10-CM

## 2021-09-07 DIAGNOSIS — J069 Acute upper respiratory infection, unspecified: Secondary | ICD-10-CM | POA: Diagnosis not present

## 2021-09-26 DIAGNOSIS — I25810 Atherosclerosis of coronary artery bypass graft(s) without angina pectoris: Secondary | ICD-10-CM | POA: Diagnosis not present

## 2021-09-26 DIAGNOSIS — E785 Hyperlipidemia, unspecified: Secondary | ICD-10-CM | POA: Diagnosis not present

## 2021-09-26 DIAGNOSIS — I10 Essential (primary) hypertension: Secondary | ICD-10-CM | POA: Diagnosis not present

## 2021-10-02 DIAGNOSIS — Z20828 Contact with and (suspected) exposure to other viral communicable diseases: Secondary | ICD-10-CM | POA: Diagnosis not present

## 2021-10-02 DIAGNOSIS — J111 Influenza due to unidentified influenza virus with other respiratory manifestations: Secondary | ICD-10-CM | POA: Diagnosis not present

## 2021-10-02 DIAGNOSIS — R5382 Chronic fatigue, unspecified: Secondary | ICD-10-CM | POA: Diagnosis not present

## 2021-10-02 DIAGNOSIS — R509 Fever, unspecified: Secondary | ICD-10-CM | POA: Diagnosis not present

## 2021-10-02 DIAGNOSIS — R051 Acute cough: Secondary | ICD-10-CM | POA: Diagnosis not present

## 2021-10-17 DIAGNOSIS — I10 Essential (primary) hypertension: Secondary | ICD-10-CM | POA: Insufficient documentation

## 2021-10-17 DIAGNOSIS — R0602 Shortness of breath: Secondary | ICD-10-CM | POA: Insufficient documentation

## 2021-10-17 NOTE — Progress Notes (Signed)
Cardiology Office Note:    Date:  10/18/2021   ID:  James Wilkins, DOB 18-May-1961, MRN [id number]  PCP:  Ginger Organ., MD  [facility] Providers Cardiologist:  [name], MD Cardiology APP:  Sharmon Revere    Referring MD: Ginger Organ., MD   Chief Complaint:  Follow-up on CAD, HTN, HLD    Patient Profile: Coronary artery disease, aortic stenosis S/p CABG + bioprosthetic AVR 08/2017 Hypertension Hyperlipidemia LBBB GERD   Prior CV studies: Chest CT 08/16/2021 No ascending aortic aneurysm, greatest diameter 3.7 cm Aortic atherosclerosis  Echocardiogram 07/26/2021 EF 60-65, no RWMA, mild LVH, G1 DD, normal RVSF, trivial MR, AVR without AI or AS, ascending aorta 43 mm  ABIs 07/19/2021 Normal   Carotid US 08/21/2017 Bilateral ICA 1-39   Cardiac catheterization 08/12/2017 LAD 85 LCx ostial 90, 80 RCA 40    History of Present Illness:   James Wilkins is a 61 y.o. male with the above problem list.  He was last seen in 10/22.  He continued to have symptoms of shortness of breath.  He also had bilateral leg discomfort somewhat concerning for claudication.   An echocardiogram was attained and demonstrated normal EF and normally functioning AVR.  ABIs were obtained and were normal.  His LDL was above goal on most recent lipids and ezetimibe was added to his medical regimen.  He did have a dilated aorta on echocardiogram and chest CTA was obtained.  This demonstrated no evidence for ascending aortic aneurysm.  He had dilation of his aorta on a prior CT.  Therefore, he will be getting a follow-up chest MRA next year.  He returns for follow-up.  He is here alone.  Since last seen, he has been doing well.  He has not had chest pain, syncope, orthopnea, leg edema.  He has not had significant shortness of breath.  He decided not to start on HCTZ.  He had a normal blood pressure at his annual physical.  He was also concerned about his elevated creatinine on blood  work.  His blood pressure has been fairly stable since.    Past Medical History:  Diagnosis Date   Aortic stenosis    s/p AVR // Echocardiogram 10/22: EF 60-65, no RWMA, mild LVH, Gr 1 DD, normal RVSF, trivial MR, s/p AVR with normal function (no AS, no AI, mean 8.4 mmHg), mild dilation of ascending aorta (43 mm)   Arthritis    "joints" (08/27/2017)   Bicuspid aortic valve    Celiac disease    Coronary artery disease involving native coronary artery of native heart with angina pectoris (HCC)    Dilation of esophagus    tortuous esophagus   Dyspnea    Gastritis    non erosive   GERD (gastroesophageal reflux disease)    Heart murmur    "ever since I was little" (08/27/2017)   Hemorrhoids    Hiatal hernia    HLD (hyperlipidemia)    Hypertension    Hypothyroidism    S/P aortic valve replacement with bioprosthetic valve 08/25/2017   25 mm Edwards Intuity Elite bovine pericardial stented rapid deployment valve   Thoracic ascending aortic aneurysm    CT 2018: 4 cm // Echo 2022: 4.3 cm // CT 11/22: 3.7 cm   Current Medications: Current Meds  Medication Sig   acetaminophen (TYLENOL) 500 MG tablet Take 500 mg by mouth every 6 (six) hours as needed for mild pain, headache or moderate pain.  aspirin EC 81 MG tablet Take 81 mg by mouth daily.   ezetimibe (ZETIA) 10 MG tablet Take 1 tablet (10 mg total) by mouth daily.   GABAPENTIN EX Take 300 mg by mouth at bedtime.   hydrocortisone (ANUSOL-HC) 2.5 % rectal cream Apply 1 application topically as needed for anal itching or hemorrhoids.   levothyroxine (SYNTHROID) 75 MCG tablet Take 75 mcg by mouth daily before breakfast.   lisinopril (PRINIVIL,ZESTRIL) 20 MG tablet Take 20 mg by mouth daily.   metoprolol tartrate (LOPRESSOR) 25 MG tablet TAKE 1 TABLET BY MOUTH TWICE DAILY   rosuvastatin (CRESTOR) 20 MG tablet Take 1 tablet by mouth at bedtime.    Allergies:   Gluten meal and Wheat bran   Social History   Tobacco Use   Smoking  status: Never   Smokeless tobacco: Never  Vaping Use   Vaping Use: Never used  Substance Use Topics   Alcohol use: No   Drug use: No    Family Hx: The patient's family history includes Brain cancer in his father; Cancer in an other family member; Colon polyps in his sister; Diabetes in his father; Heart disease in his father and mother; Hypertension in an other family member. There is no history of Colon cancer, Kidney disease, Esophageal cancer, Stomach cancer, or Rectal cancer.  ROS   EKGs/Labs/Other Test Reviewed:    EKG:  EKG is   ordered today.  The ekg ordered today demonstrates NSR, HR 62, left bundle branch block, QTC 444, no change from prior tracing  Recent Labs: 07/19/2021: ALT 29; BUN 16; Creatinine, Ser 1.56; Potassium 4.7; Sodium 139   Recent Lipid Panel Recent Labs    07/19/21 1227  CHOL 165  TRIG 91  HDL 58  LDLCALC 90     Risk Assessment/Calculations:         Physical Exam:    VS:  BP 132/68    Pulse 62    Ht 5' 5"  (1.651 m)    Wt 176 lb (79.8 kg)    SpO2 97%    BMI 29.29 kg/m     Wt Readings from Last 3 Encounters:  10/18/21 176 lb (79.8 kg)  07/12/21 181 lb (82.1 kg)  09/21/20 182 lb (82.6 kg)    Constitutional:      Appearance: Healthy appearance. Not in distress.  Neck:     Vascular: No JVR. JVD normal.  Pulmonary:     Effort: Pulmonary effort is normal.     Breath sounds: No wheezing. No rales.  Cardiovascular:     Normal rate. Regular rhythm. Normal S1. Normal S2.      Murmurs: There is a systolic murmur at the LLSB.  Edema:    Peripheral edema absent.  Abdominal:     Palpations: Abdomen is soft.  Skin:    General: Skin is warm and dry.  Neurological:     General: No focal deficit present.     Mental Status: Alert and oriented to person, place and time.     Cranial Nerves: Cranial nerves are intact.        ASSESSMENT & PLAN:   CAD (coronary artery disease) History of CABG in 2018.  He is doing well without anginal symptoms.   Continue aspirin 81 mg daily, rosuvastatin 20 mg daily.  Follow-up 1 year.  Thoracic ascending aortic aneurysm He has had dilation of his aorta on prior studies.  Follow-up CT in November demonstrated no dilation of his aorta.  Plan is to obtain  a chest/aorta MRA next year.  If this demonstrates a normal-sized aorta, he will not need further imaging.  Essential hypertension Fair control.  He decided not to start on HCTZ.  He has had fairly stable blood pressures since his last visit.  We discussed the importance of maintaining good blood pressure control.  I have asked him to monitor his blood pressure.  He knows to contact us if he has frequent readings above 130/80.  Continue lisinopril 20 mg daily, metoprolol tartrate 25 mg twice daily.  He would like to avoid HCTZ.  If needed, we could place him on amlodipine or increase his lisinopril.  HLD (hyperlipidemia) Continue rosuvastatin 20 mg daily, ezetimibe 10 mg daily.  Arrange follow-up fasting lipids, LFTs.  S/P aortic valve replacement with bioprosthetic valve  Normally functioning AVR on most recent echocardiogram.  Continue SBE prophylaxis.          Dispo:  Return in about 1 year (around 10/18/2022) for Routine Follow Up, w/ Dr. Burt Knack, or Richardson Dopp, PA-C.   Medication Adjustments/Labs and Tests Ordered: Current medicines are reviewed at length with the patient today.  Concerns regarding medicines are outlined above.  Tests Ordered: Orders Placed This Encounter  Procedures   Lipid panel   Hepatic function panel   EKG 12-Lead   Medication Changes: No orders of the defined types were placed in this encounter.  Signed, Richardson Dopp, PA-C  10/18/2021 11:56 AM    [facility] [place], [place], [place]  [address] Phone: [phone]; Fax: [phone]

## 2021-10-18 ENCOUNTER — Ambulatory Visit: Payer: BC Managed Care – PPO | Admitting: Physician Assistant

## 2021-10-18 ENCOUNTER — Other Ambulatory Visit: Payer: Self-pay

## 2021-10-18 ENCOUNTER — Encounter: Payer: Self-pay | Admitting: Physician Assistant

## 2021-10-18 VITALS — BP 132/68 | HR 62 | Ht 65.0 in | Wt 176.0 lb

## 2021-10-18 DIAGNOSIS — R0602 Shortness of breath: Secondary | ICD-10-CM

## 2021-10-18 DIAGNOSIS — I7121 Aneurysm of the ascending aorta, without rupture: Secondary | ICD-10-CM

## 2021-10-18 DIAGNOSIS — E7800 Pure hypercholesterolemia, unspecified: Secondary | ICD-10-CM

## 2021-10-18 DIAGNOSIS — I2510 Atherosclerotic heart disease of native coronary artery without angina pectoris: Secondary | ICD-10-CM | POA: Diagnosis not present

## 2021-10-18 DIAGNOSIS — I10 Essential (primary) hypertension: Secondary | ICD-10-CM

## 2021-10-18 DIAGNOSIS — Z953 Presence of xenogenic heart valve: Secondary | ICD-10-CM

## 2021-10-18 NOTE — Assessment & Plan Note (Signed)
Normally functioning AVR on most recent echocardiogram.  Continue SBE prophylaxis.

## 2021-10-18 NOTE — Patient Instructions (Addendum)
Medication Instructions:  Your physician recommends that you continue on your current medications as directed. Please refer to the Current Medication list given to you today.  *If you need a refill on your cardiac medications before your next appointment, please call your pharmacy*   Lab Work: COME BACK IN 1-2 WEEKS, FASTING FOR LABS, YOU CAN COME AS EARLY AS 7:15 A.M. :  LIPID & LFT  If you have labs (blood work) drawn today and your tests are completely normal, you will receive your results only by: [place] (if you have MyChart) OR A paper copy in the mail If you have any lab test that is abnormal or we need to change your treatment, we will call you to review the results.   Testing/Procedures: None ordered   Follow-Up: At [facility], you and your health needs are our priority.  As part of our continuing mission to provide you with exceptional heart care, we have created designated Provider Care Teams.  These Care Teams include your primary Cardiologist (physician) and Advanced Practice Providers (APPs -  Physician Assistants and Nurse Practitioners) who all work together to provide you with the care you need, when you need it.  We recommend signing up for the patient portal called "MyChart".  Sign up information is provided on this After Visit Summary.  MyChart is used to connect with patients for Virtual Visits (Telemedicine).  Patients are able to view lab/test results, encounter notes, upcoming appointments, etc.  Non-urgent messages can be sent to your provider as well.   To learn more about what you can do with MyChart, go to [email or web address].    Your next appointment:   12 month(s)  The format for your next appointment:   In Person  Provider:   [name], MD  or Richardson Dopp, PA-C         Other Instructions

## 2021-10-18 NOTE — Assessment & Plan Note (Signed)
History of CABG in 2018.  He is doing well without anginal symptoms.  Continue aspirin 81 mg daily, rosuvastatin 20 mg daily.  Follow-up 1 year.

## 2021-10-18 NOTE — Assessment & Plan Note (Addendum)
Fair control.  He decided not to start on HCTZ.  He has had fairly stable blood pressures since his last visit.  We discussed the importance of maintaining good blood pressure control.  I have asked him to monitor his blood pressure.  He knows to contact us if he has frequent readings above 130/80.  Continue lisinopril 20 mg daily, metoprolol tartrate 25 mg twice daily.  He would like to avoid HCTZ.  If needed, we could place him on amlodipine or increase his lisinopril.

## 2021-10-18 NOTE — Assessment & Plan Note (Signed)
Continue rosuvastatin 20 mg daily, ezetimibe 10 mg daily.  Arrange follow-up fasting lipids, LFTs.

## 2021-10-18 NOTE — Assessment & Plan Note (Signed)
He has had dilation of his aorta on prior studies.  Follow-up CT in November demonstrated no dilation of his aorta.  Plan is to obtain a chest/aorta MRA next year.  If this demonstrates a normal-sized aorta, he will not need further imaging.

## 2021-10-21 ENCOUNTER — Other Ambulatory Visit: Payer: BC Managed Care – PPO | Admitting: *Deleted

## 2021-10-21 ENCOUNTER — Other Ambulatory Visit: Payer: Self-pay

## 2021-10-21 DIAGNOSIS — I10 Essential (primary) hypertension: Secondary | ICD-10-CM

## 2021-10-21 DIAGNOSIS — I7121 Aneurysm of the ascending aorta, without rupture: Secondary | ICD-10-CM | POA: Diagnosis not present

## 2021-10-21 DIAGNOSIS — Z953 Presence of xenogenic heart valve: Secondary | ICD-10-CM

## 2021-10-21 DIAGNOSIS — I2510 Atherosclerotic heart disease of native coronary artery without angina pectoris: Secondary | ICD-10-CM

## 2021-10-21 DIAGNOSIS — E7800 Pure hypercholesterolemia, unspecified: Secondary | ICD-10-CM

## 2021-10-21 LAB — LIPID PANEL
Chol/HDL Ratio: 2.4 ratio (ref 0.0–5.0)
Cholesterol, Total: 161 mg/dL (ref 100–199)
HDL: 68 mg/dL (ref >39)
LDL Chol Calc (NIH): 78 mg/dL (ref 0–99)
Triglycerides: 78 mg/dL (ref 0–149)
VLDL Cholesterol Cal: 15 mg/dL (ref 5–40)

## 2021-10-21 LAB — HEPATIC FUNCTION PANEL
ALT: 27 IU/L (ref 0–44)
AST: 27 IU/L (ref 0–40)
Albumin: 4.5 g/dL (ref 3.8–4.8)
Alkaline Phosphatase: 74 IU/L (ref 44–121)
Bilirubin Total: 0.3 mg/dL (ref 0.0–1.2)
Bilirubin, Direct: <0.1 mg/dL (ref 0.00–0.40)
Total Protein: 7.3 g/dL (ref 6.0–8.5)

## 2021-10-22 ENCOUNTER — Other Ambulatory Visit: Payer: Self-pay | Admitting: *Deleted

## 2021-10-22 DIAGNOSIS — E7800 Pure hypercholesterolemia, unspecified: Secondary | ICD-10-CM

## 2021-10-22 DIAGNOSIS — E782 Mixed hyperlipidemia: Secondary | ICD-10-CM

## 2021-10-22 MED ORDER — ROSUVASTATIN CALCIUM 40 MG PO TABS: 40.0000 mg | ORAL_TABLET | Freq: Every day | ORAL | 3 refills | Status: DC

## 2021-10-25 ENCOUNTER — Other Ambulatory Visit: Payer: BC Managed Care – PPO

## 2021-10-31 DIAGNOSIS — S61312A Laceration without foreign body of right middle finger with damage to nail, initial encounter: Secondary | ICD-10-CM | POA: Diagnosis not present

## 2022-01-03 DIAGNOSIS — M545 Low back pain, unspecified: Secondary | ICD-10-CM | POA: Diagnosis not present

## 2022-01-15 ENCOUNTER — Other Ambulatory Visit: Payer: BC Managed Care – PPO

## 2022-01-17 ENCOUNTER — Other Ambulatory Visit: Payer: BC Managed Care – PPO | Admitting: *Deleted

## 2022-01-17 DIAGNOSIS — E782 Mixed hyperlipidemia: Secondary | ICD-10-CM | POA: Diagnosis not present

## 2022-01-17 DIAGNOSIS — E7800 Pure hypercholesterolemia, unspecified: Secondary | ICD-10-CM | POA: Diagnosis not present

## 2022-01-17 LAB — HEPATIC FUNCTION PANEL
ALT: 22 IU/L (ref 0–44)
AST: 32 IU/L (ref 0–40)
Albumin: 4.4 g/dL (ref 3.8–4.8)
Alkaline Phosphatase: 71 IU/L (ref 44–121)
Bilirubin Total: 0.3 mg/dL (ref 0.0–1.2)
Bilirubin, Direct: <0.1 mg/dL (ref 0.00–0.40)
Total Protein: 7.1 g/dL (ref 6.0–8.5)

## 2022-01-17 LAB — LIPID PANEL
Chol/HDL Ratio: 2.9 ratio (ref 0.0–5.0)
Cholesterol, Total: 146 mg/dL (ref 100–199)
HDL: 51 mg/dL (ref >39)
LDL Chol Calc (NIH): 80 mg/dL (ref 0–99)
Triglycerides: 77 mg/dL (ref 0–149)
VLDL Cholesterol Cal: 15 mg/dL (ref 5–40)

## 2022-01-21 ENCOUNTER — Telehealth: Payer: Self-pay | Admitting: *Deleted

## 2022-01-21 DIAGNOSIS — I10 Essential (primary) hypertension: Secondary | ICD-10-CM

## 2022-01-21 DIAGNOSIS — I2510 Atherosclerotic heart disease of native coronary artery without angina pectoris: Secondary | ICD-10-CM

## 2022-01-21 DIAGNOSIS — E7800 Pure hypercholesterolemia, unspecified: Secondary | ICD-10-CM

## 2022-01-21 DIAGNOSIS — Z953 Presence of xenogenic heart valve: Secondary | ICD-10-CM

## 2022-01-21 NOTE — Telephone Encounter (Signed)
-----   Message from Liliane Shi, [place] sent at 01/19/2022  5:47 PM EDT ----- ?LFTs normal.  LDL above goal.   ?PLAN:  ?-Continue current medications ?-Refer to [facility] for consideration of PCSK9 inhibitor. ?Richardson Dopp, PA-C    ?01/19/2022 5:43 PM   ?

## 2022-01-22 NOTE — Telephone Encounter (Signed)
Pt was not taking Zetia and Crestor. ?Resume Zetia 10 mg once daily. ?Continue Crestor 40 mg once daily. ? ?Patient's BP above goal.  If he is ok with it, increase Lisinopril to 40 mg once daily. ? ?BMET 2 weeks. ? ?Lipids and LFTs in 3 mos.  ? ?Richardson Dopp, PA-C    ?01/22/2022 5:01 PM   ?

## 2022-01-23 MED ORDER — EZETIMIBE 10 MG PO TABS: 10.0000 mg | ORAL_TABLET | Freq: Every day | ORAL | 3 refills | Status: DC

## 2022-01-23 MED ORDER — LISINOPRIL 40 MG PO TABS: 40.0000 mg | ORAL_TABLET | Freq: Every day | ORAL | 3 refills | Status: DC

## 2022-01-23 NOTE — Telephone Encounter (Signed)
Left message for patient to review Scott's message in MyChart. Advised to reply to message or call us back.  ?

## 2022-02-06 ENCOUNTER — Other Ambulatory Visit: Payer: BC Managed Care – PPO

## 2022-02-06 DIAGNOSIS — Z953 Presence of xenogenic heart valve: Secondary | ICD-10-CM | POA: Diagnosis not present

## 2022-02-06 DIAGNOSIS — I2510 Atherosclerotic heart disease of native coronary artery without angina pectoris: Secondary | ICD-10-CM | POA: Diagnosis not present

## 2022-02-06 DIAGNOSIS — E7800 Pure hypercholesterolemia, unspecified: Secondary | ICD-10-CM | POA: Diagnosis not present

## 2022-02-06 DIAGNOSIS — I10 Essential (primary) hypertension: Secondary | ICD-10-CM | POA: Diagnosis not present

## 2022-02-06 LAB — BASIC METABOLIC PANEL
BUN/Creatinine Ratio: 10 (ref 10–24)
BUN: 14 mg/dL (ref 8–27)
CO2: 27 mmol/L (ref 20–29)
Calcium: 9.2 mg/dL (ref 8.6–10.2)
Chloride: 104 mmol/L (ref 96–106)
Creatinine, Ser: 1.46 mg/dL — ABNORMAL HIGH (ref 0.76–1.27)
Glucose: 92 mg/dL (ref 70–99)
Potassium: 4.1 mmol/L (ref 3.5–5.2)
Sodium: 141 mmol/L (ref 134–144)
eGFR: 54 mL/min/{1.73_m2} — ABNORMAL LOW (ref >59)

## 2022-02-17 ENCOUNTER — Other Ambulatory Visit: Payer: Self-pay | Admitting: Cardiovascular Disease

## 2022-02-17 DIAGNOSIS — R070 Pain in throat: Secondary | ICD-10-CM | POA: Diagnosis not present

## 2022-03-27 ENCOUNTER — Encounter: Payer: Self-pay | Admitting: *Deleted

## 2022-04-11 DIAGNOSIS — M7542 Impingement syndrome of left shoulder: Secondary | ICD-10-CM | POA: Diagnosis not present

## 2022-04-25 ENCOUNTER — Other Ambulatory Visit: Payer: BC Managed Care – PPO

## 2022-04-25 DIAGNOSIS — E7800 Pure hypercholesterolemia, unspecified: Secondary | ICD-10-CM

## 2022-04-25 DIAGNOSIS — I10 Essential (primary) hypertension: Secondary | ICD-10-CM | POA: Diagnosis not present

## 2022-04-25 DIAGNOSIS — I2510 Atherosclerotic heart disease of native coronary artery without angina pectoris: Secondary | ICD-10-CM

## 2022-04-25 DIAGNOSIS — Z953 Presence of xenogenic heart valve: Secondary | ICD-10-CM

## 2022-04-25 LAB — LIPID PANEL
Chol/HDL Ratio: 2.3 ratio (ref 0.0–5.0)
Cholesterol, Total: 125 mg/dL (ref 100–199)
HDL: 55 mg/dL (ref >39)
LDL Chol Calc (NIH): 57 mg/dL (ref 0–99)
Triglycerides: 58 mg/dL (ref 0–149)
VLDL Cholesterol Cal: 13 mg/dL (ref 5–40)

## 2022-05-12 DIAGNOSIS — M25512 Pain in left shoulder: Secondary | ICD-10-CM | POA: Diagnosis not present

## 2022-07-07 DIAGNOSIS — J069 Acute upper respiratory infection, unspecified: Secondary | ICD-10-CM | POA: Diagnosis not present

## 2022-07-11 DIAGNOSIS — M545 Low back pain, unspecified: Secondary | ICD-10-CM | POA: Diagnosis not present

## 2022-07-15 DIAGNOSIS — M545 Low back pain, unspecified: Secondary | ICD-10-CM | POA: Diagnosis not present

## 2022-07-23 DIAGNOSIS — M545 Low back pain, unspecified: Secondary | ICD-10-CM | POA: Diagnosis not present

## 2022-07-30 DIAGNOSIS — M5416 Radiculopathy, lumbar region: Secondary | ICD-10-CM | POA: Insufficient documentation

## 2022-08-07 DIAGNOSIS — M5416 Radiculopathy, lumbar region: Secondary | ICD-10-CM | POA: Diagnosis not present

## 2022-08-08 DIAGNOSIS — M25512 Pain in left shoulder: Secondary | ICD-10-CM | POA: Diagnosis not present

## 2022-08-12 DIAGNOSIS — M545 Low back pain, unspecified: Secondary | ICD-10-CM | POA: Diagnosis not present

## 2022-08-15 ENCOUNTER — Other Ambulatory Visit: Payer: Self-pay | Admitting: Orthopedic Surgery

## 2022-08-15 DIAGNOSIS — E785 Hyperlipidemia, unspecified: Secondary | ICD-10-CM | POA: Diagnosis not present

## 2022-08-15 DIAGNOSIS — E039 Hypothyroidism, unspecified: Secondary | ICD-10-CM | POA: Diagnosis not present

## 2022-08-15 DIAGNOSIS — Z125 Encounter for screening for malignant neoplasm of prostate: Secondary | ICD-10-CM | POA: Diagnosis not present

## 2022-08-15 DIAGNOSIS — M545 Low back pain, unspecified: Secondary | ICD-10-CM

## 2022-08-16 DIAGNOSIS — M25512 Pain in left shoulder: Secondary | ICD-10-CM | POA: Diagnosis not present

## 2022-08-18 DIAGNOSIS — D649 Anemia, unspecified: Secondary | ICD-10-CM | POA: Diagnosis not present

## 2022-08-19 ENCOUNTER — Telehealth: Payer: Self-pay

## 2022-08-19 NOTE — Telephone Encounter (Signed)
Phone call to patient to discuss order for intradiscal injection. Spoke to pts wife, Lattie Haw, who is pts primary caregiver. Reviewed pts allergies and most recent weight. Pt was advised not to take oral antibiotics for this procedure that we would give him IV antibiotics prior. Discharge instructions also reviewed with patient and instructed patient to have a driver the day of the procedure. Pt/cg verbalized understanding.

## 2022-08-22 DIAGNOSIS — Z1331 Encounter for screening for depression: Secondary | ICD-10-CM | POA: Diagnosis not present

## 2022-08-22 DIAGNOSIS — Z0000 Encounter for general adult medical examination without abnormal findings: Secondary | ICD-10-CM | POA: Diagnosis not present

## 2022-08-22 DIAGNOSIS — Z1339 Encounter for screening examination for other mental health and behavioral disorders: Secondary | ICD-10-CM | POA: Diagnosis not present

## 2022-08-22 DIAGNOSIS — I25810 Atherosclerosis of coronary artery bypass graft(s) without angina pectoris: Secondary | ICD-10-CM | POA: Diagnosis not present

## 2022-08-22 DIAGNOSIS — I129 Hypertensive chronic kidney disease with stage 1 through stage 4 chronic kidney disease, or unspecified chronic kidney disease: Secondary | ICD-10-CM | POA: Diagnosis not present

## 2022-08-22 DIAGNOSIS — I10 Essential (primary) hypertension: Secondary | ICD-10-CM | POA: Diagnosis not present

## 2022-08-22 DIAGNOSIS — Z23 Encounter for immunization: Secondary | ICD-10-CM | POA: Diagnosis not present

## 2022-08-26 ENCOUNTER — Encounter: Payer: Self-pay | Admitting: Nurse Practitioner

## 2022-08-27 DIAGNOSIS — M25512 Pain in left shoulder: Secondary | ICD-10-CM | POA: Diagnosis not present

## 2022-09-04 ENCOUNTER — Ambulatory Visit
Admission: RE | Admit: 2022-09-04 | Discharge: 2022-09-04 | Disposition: A | Discharge status: Home / Self Care | Payer: Self-pay | Source: Ambulatory Visit | Attending: Orthopedic Surgery | Admitting: Orthopedic Surgery

## 2022-09-04 ENCOUNTER — Other Ambulatory Visit: Payer: Self-pay | Admitting: Orthopedic Surgery

## 2022-09-04 ENCOUNTER — Ambulatory Visit
Admission: RE | Admit: 2022-09-04 | Discharge: 2022-09-04 | Disposition: A | Discharge status: Home / Self Care | Payer: BC Managed Care – PPO | Source: Ambulatory Visit | Attending: Orthopedic Surgery | Admitting: Orthopedic Surgery

## 2022-09-04 DIAGNOSIS — G8929 Other chronic pain: Secondary | ICD-10-CM

## 2022-09-04 DIAGNOSIS — M5116 Intervertebral disc disorders with radiculopathy, lumbar region: Secondary | ICD-10-CM | POA: Diagnosis not present

## 2022-09-04 MED ORDER — IOPAMIDOL (ISOVUE-M 200) INJECTION 41%
1.0000 mL | Freq: Once | INTRAMUSCULAR | Status: AC
  Administered 2022-09-04: 1 mL

## 2022-09-04 MED ORDER — METHYLPREDNISOLONE ACETATE 40 MG/ML INJ SUSP (RADIOLOG
80.0000 mg | Freq: Once | INTRAMUSCULAR | Status: AC
  Administered 2022-09-04: 80 mg via INTRALESIONAL

## 2022-09-04 MED ORDER — CEFAZOLIN SODIUM-DEXTROSE 2-4 GM/100ML-% IV SOLN
2.0000 g | INTRAVENOUS | Status: AC
  Administered 2022-09-04: 2 g via INTRAVENOUS

## 2022-09-04 NOTE — Discharge Instructions (Signed)
Intra-Discal Anesthetic Injection Discharge Instruction Sheet  You may resume a regular diet and any medications that you routinely take (including pain medications).  No driving day of procedure.  Light activity throughout the rest of the day.  Do not do any strenuous work, exercise, bending or lifting.  The day following the procedure, you can resume normal physical activity but you should refrain from exercising or physical therapy for at least three days thereafter.   Please contact our office at [phone] for the following symptoms: Fever greater than 100 degrees. Headaches unresolved with medication after 2-3 days.    YOU MAY RESUME YOUR ASPIRIN ANYTIME AFTER PROCEDURE TODAY

## 2022-09-16 ENCOUNTER — Encounter: Payer: Self-pay | Admitting: Nurse Practitioner

## 2022-09-16 ENCOUNTER — Ambulatory Visit: Payer: BC Managed Care – PPO | Admitting: Nurse Practitioner

## 2022-09-16 VITALS — BP 134/70 | HR 62 | Ht 66.0 in | Wt 178.8 lb

## 2022-09-16 DIAGNOSIS — D509 Iron deficiency anemia, unspecified: Secondary | ICD-10-CM

## 2022-09-16 DIAGNOSIS — K625 Hemorrhage of anus and rectum: Secondary | ICD-10-CM | POA: Diagnosis not present

## 2022-09-16 DIAGNOSIS — K900 Celiac disease: Secondary | ICD-10-CM

## 2022-09-16 MED ORDER — NA SULFATE-K SULFATE-MG SULF 17.5-3.13-1.6 GM/177ML PO SOLN: 1.0000 | Freq: Once | ORAL | 0 refills | Status: AC

## 2022-09-16 NOTE — Progress Notes (Signed)
09/16/2022 HUBERT RAATZ [id number] 1961-03-08   CHIEF COMPLAINT: Rectal bleeding, anemia   HISTORY OF PRESENT ILLNESS: James Wilkins is a 61 year old male with a past medical history of hypertension, coronary artery disease and AS s/p 2 vessel CABG and bioprosthetic AVR 08/2017, thoracic ascending aortic aneurysm, LBBB, hypothyroidism, celiac disease and GERD.   He was last seen by Dr. Carlean Purl in January 2016, at that time his celiac disease was stable and his bleeding hemorrhoids diminished as his diarrhea improved on a strict gluten free diet.   He presents today as referred by Dr. Marton Redwood for further evaluation regarding IDA and intermittent rectal bleeding for the past year. He is accompanied by his wife.  He denies having any nausea or vomiting.  He describes having indigestion as abdominal bloat for which he takes Tums a few days weekly as needed.  He denies having classic heartburn symptoms or dysphagia.  He typically passes a normal formed brown bowel movement daily and sometimes strains passing a bowel movement which she believes is a habitual behavior as his stools are not hard.  He sees a moderate amount of bright red blood on the tissue and toilet water x 4 days weekly for the past year.  No anorectal pain but he does feel hemorrhoidal swelling/prolapse to the left anal rectal area.  He underwent laboratory studies his PCP 08/15/2022 which showed a hemoglobin level of 10.2 9 (Hg 13.8 one year ago). Iron 25.  Iron saturation 6%.  TIBC 399.  Ferritin 5.  Vitamin B12 945.  He was started on ferrous sulfate 325 mg once daily 3 weeks ago.  His stools turn black after he started taking the oral iron.  He sided to switch to an alternative OTC iron tablet about 1 week ago and his stools are back to normal brown.  His most recent colonoscopy was 08/2018 which was normal.  Celiac serology 08/18/2014 was diagnostic for celiac disease.  He underwent an EGD 08/26/2007 due to dysphagia which  identified a mildly tortuous esophagus and nonerosive gastritis.  The esophagus was empirically dilated.  He adheres to a 100% gluten free diet to the best of his ability.  He denies having any chest pain, shortness of breath or profound fatigue.  He takes ASA 81 mg once daily, no other NSAIDs.   ECHO 07/26/2021: IMPRESSIONS Left ventricular ejection fraction, by estimation, is 60 to 65%. The left ventricle has normal function. The left ventricle has no regional wall motion abnormalities. There is mild left ventricular hypertrophy. Left ventricular diastolic parameters are consistent with Grade I diastolic dysfunction (impaired relaxation). 1. 2. Right ventricular systolic function is normal. The right ventricular size is normal. The mitral valve is normal in structure. Trivial mitral valve regurgitation. No evidence of mitral stenosis. 3. Rapid deployment valve placed during CABG. The aortic valve has been repaired/replaced. Aortic valve regurgitation is not visualized. No aortic stenosis is present. There is a 25 mm Edwards Intuity Elite Bovine valve present in the aortic position. Procedure Date: 08/25/17. Aortic valve mean gradient measures 8.4 mmHg. Aortic valve Vmax measures 1.90 m/s. 4. Aortic dilatation noted. There is mild dilatation of the ascending aorta, measuring 43 mm. 5. The inferior vena cava is normal in size with greater than 50% respiratory variability, suggesting right atrial pressure of 3 mmHg.  GI PROCEDURES:  Colonoscopy 08/13/2018: - The entire examined colon is normal. - No specimens collected.  Colonoscopy 06/02/2008: Normal colonoscopy   EGD 08/26/2007: 1.  Mildly tortuous esophagus, no stricture.  I did dilate due to dysphagia. 2.  Proximal nonerosive gastritis. 3.  Otherwise ok   Past Medical History:  Diagnosis Date   Aortic stenosis    s/p AVR // Echocardiogram 10/22: EF 60-65, no RWMA, mild LVH, Gr 1 DD, normal RVSF, trivial MR, s/p AVR with  normal function (no AS, no AI, mean 8.4 mmHg), mild dilation of ascending aorta (43 mm)   Arthritis    "joints" (08/27/2017)   Bicuspid aortic valve    Celiac disease    Coronary artery disease involving native coronary artery of native heart with angina pectoris (HCC)    Dilation of esophagus    tortuous esophagus   Dyspnea    Gastritis    non erosive   GERD (gastroesophageal reflux disease)    Heart murmur    "ever since I was little" (08/27/2017)   Hemorrhoids    Hiatal hernia    HLD (hyperlipidemia)    Hypertension    Hypothyroidism    S/P aortic valve replacement with bioprosthetic valve 08/25/2017   25 mm Edwards Intuity Elite bovine pericardial stented rapid deployment valve   Thoracic ascending aortic aneurysm    CT 2018: 4 cm // Echo 2022: 4.3 cm // CT 11/22: 3.7 cm   Past Surgical History:  Procedure Laterality Date   AORTIC VALVE REPLACEMENT N/A 08/25/2017   Procedure: AORTIC VALVE REPLACEMENT (AVR);  Surgeon: Rexene Alberts, MD;  Location: [facility];  Service: Open Heart Surgery;  Laterality: N/A;   AORTIC VALVE REPLACEMENT (AVR)/CORONARY ARTERY BYPASS GRAFTING (CABG)  08/25/2017   25 mm Edwards Intuity Elite bovine pericardial stented rapid deployment valve   BACK SURGERY     BLADDER SURGERY     "weak spots in my bladder; they took them out"   CARDIAC CATHETERIZATION  08/12/2017   CARDIAC VALVE REPLACEMENT     COLONOSCOPY  2009   CORONARY ANGIOPLASTY     CORONARY ARTERY BYPASS GRAFT  08/25/2017   LIMA to LAD, SVG to Ramus Intermediate, EVH via left thigh   CORONARY ARTERY BYPASS GRAFT N/A 08/25/2017   Procedure: CORONARY ARTERY BYPASS GRAFTING (CABG) x two, using left internal mammary artery and left leg greater saphenous vein harvested endoscopically;  Surgeon: Rexene Alberts, MD;  Location: [facility];  Service: Open Heart Surgery;  Laterality: N/A;   ESOPHAGOGASTRODUODENOSCOPY (EGD) WITH ESOPHAGEAL DILATION  2008   LUMBAR [place]; 2016 X 2   SHOULDER  ARTHROSCOPY W/ ROTATOR CUFF REPAIR Right 2012   SHOULDER ARTHROSCOPY W/ ROTATOR CUFF REPAIR Left 2014 X 2   TEE WITHOUT CARDIOVERSION N/A 08/25/2017   Procedure: TRANSESOPHAGEAL ECHOCARDIOGRAM (TEE);  Surgeon: Rexene Alberts, MD;  Location: [facility];  Service: Open Heart Surgery;  Laterality: N/A;   UPPER GASTROINTESTINAL ENDOSCOPY     Social History: He is married.  He is a Aeronautical engineer.  Non-smoker.  No alcohol use.  Family History: Sister with celiac disease and colon polyps.  Father had brain cancer.  No known family history of esophageal, gastric or colon cancer.   Allergies  Allergen Reactions   Gluten Meal     Other reaction(s): Celiac Disease   Wheat Bran Diarrhea    Barley,rye     Outpatient Encounter Medications as of 09/16/2022  Medication Sig   acetaminophen (TYLENOL) 500 MG tablet Take 500 mg by mouth every 6 (six) hours as needed for mild pain, headache or moderate pain.   aspirin EC 81 MG tablet  Take 81 mg by mouth daily.   ezetimibe (ZETIA) 10 MG tablet Take 1 tablet (10 mg total) by mouth daily.   GABAPENTIN EX Take 300 mg by mouth at bedtime.   hydrocortisone (ANUSOL-HC) 2.5 % rectal cream Apply 1 application topically as needed for anal itching or hemorrhoids.   levothyroxine (SYNTHROID) 75 MCG tablet Take 75 mcg by mouth daily before breakfast.   lisinopril (ZESTRIL) 40 MG tablet Take 1 tablet (40 mg total) by mouth daily.   metoprolol tartrate (LOPRESSOR) 25 MG tablet TAKE 1 TABLET(25 MG) BY MOUTH TWICE DAILY   rosuvastatin (CRESTOR) 40 MG tablet Take 1 tablet (40 mg total) by mouth at bedtime.   No facility-administered encounter medications on file as of 09/16/2022.   REVIEW OF SYSTEMS:  Gen: Denies fever, sweats or chills. No weight loss.  CV: Denies chest pain, palpitations or edema. Resp: Denies cough, shortness of breath of hemoptysis.  GI: See HPI.   GU : Denies urinary burning, blood in urine, increased urinary frequency or incontinence. MS: +  Arthritis, back pain. + Muscle cramps.  Derm: Denies rash, itchiness, skin lesions or unhealing ulcers. Psych: Denies depression, anxiety, memory loss, suicidal ideation and confusion. Heme: Denies bruising, easy bleeding. Neuro:  Denies headaches, dizziness or paresthesias. Endo:  Denies any problems with DM, thyroid or adrenal function.  PHYSICAL EXAM: BP 134/70 (BP Location: Right Arm, Patient Position: Sitting)   Pulse 62   Ht 5' 6"  (1.676 m)   Wt 178 lb 12.8 oz (81.1 kg)   SpO2 98%   BMI 28.86 kg/m   General: 60 year old male in no acute distress. Head: Normocephalic and atraumatic. Eyes:  Sclerae non-icteric, conjunctive pink. Ears: Normal auditory acuity. Mouth: Dentition intact. No ulcers or lesions.  Neck: Supple, no lymphadenopathy or thyromegaly. Swelling left neck Lungs: Clear bilaterally to auscultation without wheezes, crackles or rhonchi. Heart: Regular rate and rhythm. Systolic murmur. No rub or gallop appreciated.  Abdomen: Soft, nontender, non distended. No masses. No hepatosplenomegaly. Normoactive bowel sounds x 4 quadrants.  Rectal: Patient declined exam. Musculoskeletal: Symmetrical with no gross deformities. Skin: Warm and dry. No rash or lesions on visible extremities. Extremities: No edema. Neurological: Alert oriented x 4, no focal deficits.  Psychological:  Alert and cooperative. Normal mood and affect.  ASSESSMENT AND PLAN:  83) 61 year old male with IDA, multifactorial with chronic GI blood loss and celiac disease -EGD and colonoscopy benefits and risks discussed including risk with sedation, risk of bleeding, perforation and infection  -Hold oral iron for 7 days prior to EGD/colonoscopy date, restart after procedures completed  -Patient scheduled for follow-up CBC and iron studies with his PCP in 2 to 3 weeks  2) History of hemorrhoids with bright red rectal bleeding.  Normal colonoscopy in 2019. -Apply a small amount of Desitin inside the anal  opening and to the external anal area three times daily as needed for anal or hemorrhoidal irritation/bleeding.   3) Celiac disease  -EGD as ordered above -Celiac serology to assess for unintentional gluten ingestion, consider at time of next lab draw with PCP  4) GERD, no heartburn or dysphagia.  Patient describes having abdominal bloat for which she takes Tums as needed. -1 or 2 Tums as needed -Patient to contact office if he develops heartburn, dysphagia or upper abdominal pain -EGD as ordered above will also assess for GERD  5) Coronary artery disease s/p 2 vessel CABG 08/2017. On ASA 70m QD. LVE EF 60 - 65% per ECHO 07/2021.  No CP, palpitations or SOB. -Annual cardiology follow-up due 10/2022  6) AS s/p bioprosthetic AVR 08/2017   7) Thoracic aortic aneurysm 78m per ECHO 07/2021    CC:  SGinger Organ, MD

## 2022-09-16 NOTE — Patient Instructions (Addendum)
You have been scheduled for an endoscopy and colonoscopy. Please follow the written instructions given to you at your visit today. Please pick up your prep supplies at the pharmacy within the next 1-3 days. If you use inhalers (even only as needed), please bring them with you on the day of your procedure.   Desitin (over the counter)- apply a small amount inside the anal opening and to the external anal area 3 times daily as needed for hemorrhoidal bleeding  Repeat CBC & Iron panel with PCP. Recommend Celiac panel at that time to assess for gluten exposure.   Hold oral iron for 1 week prior to your procedure.  Due to recent changes in healthcare laws, you may see the results of your imaging and laboratory studies on MyChart before your provider has had a chance to review them.  We understand that in some cases there may be results that are confusing or concerning to you. Not all laboratory results come back in the same time frame and the provider may be waiting for multiple results in order to interpret others.  Please give [place] 48 hours in order for your provider to thoroughly review all the results before contacting the office for clarification of your results.    Thank you for trusting me with your gastrointestinal care!   Carl Best, CRNP

## 2022-09-17 DIAGNOSIS — M5416 Radiculopathy, lumbar region: Secondary | ICD-10-CM | POA: Diagnosis not present

## 2022-09-24 DIAGNOSIS — E785 Hyperlipidemia, unspecified: Secondary | ICD-10-CM | POA: Diagnosis not present

## 2022-09-24 DIAGNOSIS — D509 Iron deficiency anemia, unspecified: Secondary | ICD-10-CM | POA: Diagnosis not present

## 2022-09-25 ENCOUNTER — Encounter: Payer: Self-pay | Admitting: Internal Medicine

## 2022-10-03 ENCOUNTER — Encounter: Payer: Self-pay | Admitting: Internal Medicine

## 2022-10-03 ENCOUNTER — Ambulatory Visit (AMBULATORY_SURGERY_CENTER): Payer: BC Managed Care – PPO | Admitting: Internal Medicine

## 2022-10-03 VITALS — BP 93/50 | HR 67 | Temp 98.3°F | Resp 10 | Ht 66.0 in | Wt 178.0 lb

## 2022-10-03 DIAGNOSIS — K625 Hemorrhage of anus and rectum: Secondary | ICD-10-CM

## 2022-10-03 DIAGNOSIS — K644 Residual hemorrhoidal skin tags: Secondary | ICD-10-CM

## 2022-10-03 DIAGNOSIS — D509 Iron deficiency anemia, unspecified: Secondary | ICD-10-CM | POA: Diagnosis not present

## 2022-10-03 DIAGNOSIS — K648 Other hemorrhoids: Secondary | ICD-10-CM

## 2022-10-03 DIAGNOSIS — K900 Celiac disease: Secondary | ICD-10-CM

## 2022-10-03 DIAGNOSIS — K2980 Duodenitis without bleeding: Secondary | ICD-10-CM | POA: Diagnosis not present

## 2022-10-03 MED ORDER — SODIUM CHLORIDE 0.9 % IV SOLN: 500.0000 mL | Freq: Once | INTRAVENOUS | Status: DC

## 2022-10-03 NOTE — Progress Notes (Signed)
History and Physical Interval Note:  10/03/2022 1:25 PM  James Wilkins  has presented today for endoscopic procedure(s), with the diagnosis of  Encounter Diagnoses  Name Primary?   Celiac disease Yes   Rectal bleeding    Iron deficiency anemia, unspecified iron deficiency anemia type   .  The various methods of evaluation and treatment have been discussed with the patient and/or family. After consideration of risks, benefits and other options for treatment, the patient has consented to  the endoscopic procedure(s).   The patient's history has been reviewed, patient examined, no change in status, stable for endoscopic procedure(s).  I have reviewed the patient's chart and labs.  Questions were answered to the patient's satisfaction.     Gatha Mayer, MD, [facility]

## 2022-10-03 NOTE — Progress Notes (Signed)
Called to room to assist during endoscopic procedure.  Patient ID and intended procedure confirmed with present staff. Received instructions for my participation in the procedure from the performing physician.  

## 2022-10-03 NOTE — Progress Notes (Signed)
A and O x3. Report to RN. Tolerated MAC anesthesia well.Teeth unchanged after procedure. 

## 2022-10-03 NOTE — Op Note (Addendum)
[facility] Patient Name: James Wilkins Procedure Date: 10/03/2022 1:30 PM MRN: [id number] Endoscopist: Gatha Mayer , MD, [id number] Age: 61 Referring MD:  Date of Birth: [birth year] Gender: Male Account #: [id number] Procedure:                Colonoscopy Indications:              Rectal bleeding, Iron deficiency anemia Medicines:                Monitored Anesthesia Care Procedure:                Pre-Anesthesia Assessment:                           - Prior to the procedure, a History and Physical                            was performed, and patient medications and                            allergies were reviewed. The patient's tolerance of                            previous anesthesia was also reviewed. The risks                            and benefits of the procedure and the sedation                            options and risks were discussed with the patient.                            All questions were answered, and informed consent                            was obtained. Prior Anticoagulants: The patient has                            taken no anticoagulant or antiplatelet agents. ASA                            Grade Assessment: II - A patient with mild systemic                            disease. After reviewing the risks and benefits,                            the patient was deemed in satisfactory condition to                            undergo the procedure.                           After obtaining informed consent, the colonoscope  was passed under direct vision. Throughout the                            procedure, the patient's blood pressure, pulse, and                            oxygen saturations were monitored continuously. The                            Olympus CF-HQ190L [id number]) Colonoscope was                            introduced through the anus and advanced to the the                            cecum, identified by  appendiceal orifice and                            ileocecal valve. The colonoscopy was performed                            without difficulty. The patient tolerated the                            procedure well. The quality of the bowel                            preparation was excellent. The ileocecal valve,                            appendiceal orifice, and rectum were photographed.                            The bowel preparation used was SUPREP via split                            dose instruction. Scope In: 1:41:14 PM Scope Out: 1:53:05 PM Scope Withdrawal Time: 0 hours 8 minutes 10 seconds  Total Procedure Duration: 0 hours 11 minutes 51 seconds  Findings:                 The perianal and digital rectal examinations were                            normal. Pertinent negatives include normal prostate                            (size, shape, and consistency).                           External and internal hemorrhoids were found.                           The exam was otherwise without abnormality on  direct and retroflexion views. Complications:            No immediate complications. Estimated Blood Loss:     Estimated blood loss: none. Impression:               - External and internal hemorrhoids.                           - The examination was otherwise normal on direct                            and retroflexion views.                           - No specimens collected. Recommendation:           - Patient has a contact number available for                            emergencies. The signs and symptoms of potential                            delayed complications were discussed with the                            patient. Return to normal activities tomorrow.                            Written discharge instructions were provided to the                            patient.                           - Resume previous diet.                           -  Continue present medications.                           - Repeat colonoscopy in 10 years for screening                            purposes.                           - Will offer hemorrhoid banding. discussed in                            recovery w/ wife - will make appointment                           Suepsct anemia multifactorial from hemorrhoid                            bleeding and may be in part nutritional (little to  no red meat) Gatha Mayer, MD 10/03/2022 2:04:06 PM This report has been signed electronically.

## 2022-10-03 NOTE — Patient Instructions (Addendum)
Other than seeing hemorrhoids - all looked good. I did take intestine biopsies as another check on the celiac disease.  If you are interested we can set up hemorrhoid banding procedure - please read the handout.  I appreciate the opportunity to care for you. Gatha Mayer, MD, [facility]  Handouts Provided:  Hemorrhoid banding  YOU HAD AN ENDOSCOPIC PROCEDURE TODAY AT [facility]:   Refer to the procedure report that was given to you for any specific questions about what was found during the examination.  If the procedure report does not answer your questions, please call your gastroenterologist to clarify.  If you requested that your care partner not be given the details of your procedure findings, then the procedure report has been included in a sealed envelope for you to review at your convenience later.  YOU SHOULD EXPECT: Some feelings of bloating in the abdomen. Passage of more gas than usual.  Walking can help get rid of the air that was put into your GI tract during the procedure and reduce the bloating. If you had a lower endoscopy (such as a colonoscopy or flexible sigmoidoscopy) you may notice spotting of blood in your stool or on the toilet paper. If you underwent a bowel prep for your procedure, you may not have a normal bowel movement for a few days.  Please Note:  You might notice some irritation and congestion in your nose or some drainage.  This is from the oxygen used during your procedure.  There is no need for concern and it should clear up in a day or so.  SYMPTOMS TO REPORT IMMEDIATELY:  Following lower endoscopy (colonoscopy or flexible sigmoidoscopy):  Excessive amounts of blood in the stool  Significant tenderness or worsening of abdominal pains  Swelling of the abdomen that is new, acute  Fever of 100F or higher  Following upper endoscopy (EGD)  Vomiting of blood or coffee ground material  New chest pain or pain under the shoulder blades  Painful or  persistently difficult swallowing  New shortness of breath  Fever of 100F or higher  Black, tarry-looking stools  For urgent or emergent issues, a gastroenterologist can be reached at any hour by calling [phone]. Do not use MyChart messaging for urgent concerns.    DIET:  We do recommend a small meal at first, but then you may proceed to your regular diet.  Drink plenty of fluids but you should avoid alcoholic beverages for 24 hours.  ACTIVITY:  You should plan to take it easy for the rest of today and you should NOT DRIVE or use heavy machinery until tomorrow (because of the sedation medicines used during the test).    FOLLOW UP: Our staff will call the number listed on your records the next business day following your procedure.  We will call around 7:15- 8:00 am to check on you and address any questions or concerns that you may have regarding the information given to you following your procedure. If we do not reach you, we will leave a message.     If any biopsies were taken you will be contacted by phone or by letter within the next 1-3 weeks.  Please call us at [phone] if you have not heard about the biopsies in 3 weeks.    SIGNATURES/CONFIDENTIALITY: You and/or your care partner have signed paperwork which will be entered into your electronic medical record.  These signatures attest to the fact that that the information above  on your After Visit Summary has been reviewed and is understood.  Full responsibility of the confidentiality of this discharge information lies with you and/or your care-partner.

## 2022-10-03 NOTE — Op Note (Signed)
[facility] Patient Name: Thane Age Procedure Date: 10/03/2022 1:31 PM MRN: [id number] Endoscopist: Gatha Mayer , MD, [id number] Age: 61 Referring MD:  Date of Birth: 1961-05-05 Gender: Male Account #: [id number] Procedure:                Upper GI endoscopy Indications:              Iron deficiency anemia, Celiac disease, Follow-up                            of celiac disease Medicines:                Monitored Anesthesia Care Procedure:                Pre-Anesthesia Assessment:                           - Prior to the procedure, a History and Physical                            was performed, and patient medications and                            allergies were reviewed. The patient's tolerance of                            previous anesthesia was also reviewed. The risks                            and benefits of the procedure and the sedation                            options and risks were discussed with the patient.                            All questions were answered, and informed consent                            was obtained. Prior Anticoagulants: The patient has                            taken no anticoagulant or antiplatelet agents. ASA                            Grade Assessment: II - A patient with mild systemic                            disease. After reviewing the risks and benefits,                            the patient was deemed in satisfactory condition to                            undergo the procedure.  After obtaining informed consent, the endoscope was                            passed under direct vision. Throughout the                            procedure, the patient's blood pressure, pulse, and                            oxygen saturations were monitored continuously. The                            Endoscope was introduced through the mouth, and                            advanced to the second part of duodenum.  The upper                            GI endoscopy was accomplished without difficulty.                            The patient tolerated the procedure well. Scope In: Scope Out: Findings:                 The esophagus was normal.                           The stomach was normal.                           The examined duodenum was normal.                           The cardia and gastric fundus were normal on                            retroflexion.                           Biopsies for histology were taken with a cold                            forceps in the entire duodenum for evaluation of                            celiac disease. Verification of patient                            identification for the specimen was done. Estimated                            blood loss was minimal. Complications:            No immediate complications. Estimated Blood Loss:     Estimated blood loss was minimal. Impression:               -  Normal esophagus.                           - Normal stomach.                           - Normal examined duodenum.                           - Biopsies were taken with a cold forceps for                            evaluation of celiac disease. His serologies are                            negative. Recommendation:           - Patient has a contact number available for                            emergencies. The signs and symptoms of potential                            delayed complications were discussed with the                            patient. Return to normal activities tomorrow.                            Written discharge instructions were provided to the                            patient.                           - Resume previous diet.                           - Continue present medications.                           - Await pathology results.                           - See the other procedure note for documentation of                             additional recommendations. Gatha Mayer, MD 10/03/2022 2:00:18 PM This report has been signed electronically.

## 2022-10-07 ENCOUNTER — Telehealth: Payer: Self-pay

## 2022-10-07 NOTE — Telephone Encounter (Signed)
banding appointment Received: 4 days ago Gatha Mayer, MD  Gillermina Hu, RN Please contact patient and arrange a hemorrhoid banding appointment Discussed at colonoscopy appt today

## 2022-10-07 NOTE — Telephone Encounter (Signed)
Pt wife Lattie Haw made aware of Dr. Carlean Purl recommendations: : Pt was scheduled for an hemorrhoid banding appointment  on 10/29/2022 at 3:00 with Dr. Carlean Purl: Lattie Haw made aware:  Lattie Haw  verbalized understanding with all questions answered.

## 2022-10-08 ENCOUNTER — Telehealth: Payer: Self-pay | Admitting: *Deleted

## 2022-10-08 NOTE — Telephone Encounter (Signed)
  Follow up Call-     10/03/2022    1:06 PM  Call back number  Post procedure Call Back phone  # [phone]  Permission to leave phone message Yes     Patient questions:  Do you have a fever, pain , or abdominal swelling? No. Pain Score  0 *  Have you tolerated food without any problems? Yes.    Have you been able to return to your normal activities? Yes.    Do you have any questions about your discharge instructions: Diet   No. Medications  No. Follow up visit  Yes.    Do you have questions or concerns about your Care? No.  Actions: * If pain score is 4 or above: No action needed, pain <4.pt says he did fine 1st 2 days after procedure but last 2 days he's had diarrhea and vomited x1. No vomiting today but still has diarrhea. Pt will call back if not better by this afternoon or gets worse or pt will contact his PCP.

## 2022-10-09 ENCOUNTER — Telehealth: Payer: Self-pay | Admitting: Internal Medicine

## 2022-10-09 NOTE — Telephone Encounter (Signed)
Pt stated that he had an EGD and Colonoscopy on 10/03/2022 Friday and was good the rest of that day, good Saturday , Sunday, and Monday all day until the evening and starting having diarrhea that has continued, Pt stated that he vomited twice on Monday night: Pt stated that the  diarrhea is like black water now: Pt did state that he had been taking pepto bismol. Pt notified that the Pepto could be making his stool black: Pt stated that he has not taken anything OTC such as Imodium. Pt was recommended to start imodium this AM and I will call him back this afternoon  with a symptom update: Discussed BRAT diet with pt: No abdominal no. No N/V since Monday night.

## 2022-10-09 NOTE — Telephone Encounter (Signed)
PT wife called, PT had a colonoscopy done 12/29 and as of Monday is sick. Constant diarrhea and cannot eat. Concerned that he is having complications of procedure. Please advise.

## 2022-10-09 NOTE — Telephone Encounter (Signed)
Pt stated that he took the imodium at 10 am and has not had any BM since then. Pt stated that typically the BMs start in the evening and last through the night. Pt was notified that I would call him in the AM for symptom update:

## 2022-10-10 NOTE — Telephone Encounter (Signed)
Pt contacted for symptom update: Pt stated that he had a good night with no diarrhea: Pt notified to contact us if has any more GI related issues: Pt verbalized understanding with all questions answered.

## 2022-10-28 ENCOUNTER — Other Ambulatory Visit: Payer: Self-pay | Admitting: Physician Assistant

## 2022-10-29 ENCOUNTER — Encounter: Payer: Self-pay | Admitting: Internal Medicine

## 2022-10-29 ENCOUNTER — Ambulatory Visit: Payer: BC Managed Care – PPO | Admitting: Internal Medicine

## 2022-10-29 VITALS — BP 128/60 | HR 84 | Ht 66.0 in | Wt 177.0 lb

## 2022-10-29 DIAGNOSIS — K648 Other hemorrhoids: Secondary | ICD-10-CM

## 2022-10-29 DIAGNOSIS — K644 Residual hemorrhoidal skin tags: Secondary | ICD-10-CM

## 2022-10-29 NOTE — Patient Instructions (Signed)
HEMORRHOID BANDING PROCEDURE    FOLLOW-UP CARE   The procedure you have had should have been relatively painless since the banding of the area involved does not have nerve endings and there is no pain sensation.  The rubber band cuts off the blood supply to the hemorrhoid and the band may fall off as soon as 48 hours after the banding (the band may occasionally be seen in the toilet bowl following a bowel movement). You may notice a temporary feeling of fullness in the rectum which should respond adequately to plain Tylenol or Motrin.  Following the banding, avoid strenuous exercise that evening and resume full activity the next day.  A sitz bath (soaking in a warm tub) or bidet is soothing, and can be useful for cleansing the area after bowel movements.     To avoid constipation, take two tablespoons of natural wheat bran, natural oat bran, flax, Benefiber or any over the counter fiber supplement and increase your water intake to 7-8 glasses daily.    Unless you have been prescribed anorectal medication, do not put anything inside your rectum for two weeks: No suppositories, enemas, fingers, etc.  Occasionally, you may have more bleeding than usual after the banding procedure.  This is often from the untreated hemorrhoids rather than the treated one.  Don't be concerned if there is a tablespoon or so of blood.  If there is more blood than this, lie flat with your bottom higher than your head and apply an ice pack to the area. If the bleeding does not stop within a half an hour or if you feel faint, call our office at [phone] or go to the emergency room.  Problems are not common; however, if there is a substantial amount of bleeding, severe pain, chills, fever or difficulty passing urine (very rare) or other problems, you should call us at [phone] or report to the nearest emergency room.  Do not stay seated continuously for more than 2-3 hours for a day or two after the procedure.   Tighten your buttock muscles 10-15 times every two hours and take 10-15 deep breaths every 1-2 hours.  Do not spend more than a few minutes on the toilet if you cannot empty your bowel; instead re-visit the toilet at a later time.  I appreciate the opportunity to care for you. Carl Gessner, MD, FACG  

## 2022-10-29 NOTE — Progress Notes (Unsigned)
   HEMORRHOID LIGATION  Indication: Rectal bleeding and prolapse  Colonoscopy 10/03/2022 with internal and external hemorrhoids   Rectal exam: Anoderm shows right anterior tag/external hemorrhoid.  Small and soft.  There is hemorrhoidal bulge/prolapse seen on the left lateral.  Minimal and subtle.  Other smaller tags in the right posterior region.  Digital exam with a palpable left lateral complex otherwise normal.  Nontender no sign of fissure.   Anoscopy: Grade 2 prolapsed left lateral internal hemorrhoid, there is a grade 1 right posterior internal/external complex with some and some stigmata of bleeding in the external component of this and a grade 1 right anterior complex without signs of bleeding.   PROCEDURE NOTE: The patient presents with symptomatic grade 2  hemorrhoids, requesting rubber band ligation of his/her hemorrhoidal disease.  All risks, benefits and alternative forms of therapy were described and informed consent was obtained.   The anorectum was pre-medicated with 0.125% nitroglycerin and 5% lidocaine The decision was made to band the left lateral internal hemorrhoid, and the [place] was used to perform band ligation without complication.  The initial and was not well-seated so I placed an additional band and removed the superficially seated band.   The patient was discharged home without pain or other issues.  Dietary and behavioral recommendations were given and along with follow-up instructions.       The patient will return in February for  follow-up and possible additional banding as required. No complications were encountered and the patient tolerated the procedure well.

## 2022-11-05 NOTE — Progress Notes (Signed)
Cardiology Office Note:    Date:  11/07/2022   ID:  James Wilkins, DOB 13-Mar-1961, MRN [id number]  PCP:  Ginger Organ., MD  [place] [facility] Cardiologist:  [name], MD Cardiology APP:  Sh[facility]on [place]     Referring MD: Ginger Organ., MD   Patient Profile: Coronary artery disease, aortic stenosis S/p CABG + bioprosthetic AVR 08/2017 TTE 07/26/21: EF 60-65, no RWMA, mild LVH, G1 DD, normal RVSF, trivial MR, AVR without AI or AS, ascending aorta 43 mm Hypertension Hyperlipidemia LBBB GERD Carotid US 08/21/17: bilat 1-39 ABIs 07/19/21: normal      History of Present Illness:   James Wilkins is a 62 y.o. male with the above problem list.  He was last seen in Jans 2023. He returns for f/u on CAD, AV disease. He is here alone. He needs spinal fusion with Dr. Melina Schools ([facility]) in the near future.  He is doing well without chest pain, shortness of breath, syncope, orthopnea, leg edema.  He was having symptomatic hypotension with systolic blood pressures in the 100s.  He decreased his lisinopril to 20 mg daily with improvement in his blood pressures and resolution of his symptoms.  EKG: Sinus bradycardia, HR 51, left bundle branch block, no change from prior tracing     Reviewed and updated this encounter:  Tobacco  Allergies  Meds  Problems  Med Hx  Surg Hx  Fam Hx     ROS  Labs/Other Test Reviewed:   Recent Labs: 01/17/2022: ALT 22 02/06/2022: BUN 14; Creatinine, Ser 1.46; Potassium 4.1; Sodium 141   Recent Lipid Panel Recent Labs    04/25/22 0718  CHOL 125  TRIG 58  HDL 55  LDLCALC 57    Labs from PCP in Nov 2023: LDL 62  Risk Assessment/Calculations/Metrics:         HYPERTENSION CONTROL Vitals:   11/07/22 0838 11/07/22 0944  BP: (!) 142/78 (!) 140/80    The patient's blood pressure is elevated above target today.  In order to address the patient's elevated BP: Blood pressure will be monitored at home to determine  if medication changes need to be made.      Physical Exam:   VS:  BP (!) 140/80   Pulse (!) 51   Ht 5\' 6"  (1.676 m)   Wt 179 lb 3.2 oz (81.3 kg)   SpO2 95%   BMI 28.92 kg/m    Wt Readings from Last 3 Encounters:  11/07/22 179 lb 3.2 oz (81.3 kg)  10/29/22 177 lb (80.3 kg)  10/03/22 178 lb (80.7 kg)    Constitutional:      Appearance: Healthy appearance. Not in distress.  Neck:     Vascular: JVD normal.  Pulmonary:     Breath sounds: Normal breath sounds. No wheezing. No rales.  Cardiovascular:     Bradycardia present. Regular rhythm. Normal S1. Normal S2.      Murmurs: There is no murmur.  Edema:    Peripheral edema absent.  Abdominal:     Palpations: Abdomen is soft.         ASSESSMENT & PLAN:   CAD (coronary artery disease) Status post CABG plus bioprosthetic AVR in November 2018.  He is doing well without chest pain to suggest angina.  Continue aspirin 81 mg daily, Crestor 40 mg daily, ezetimibe 10 mg daily.  Follow-up in 1 year.  Bicuspid aortic valve Status post bioprosthetic AVR November 2018.  He  had a normally functioning valve on his echocardiogram in 2022.  Continue SBE prophylaxis.  HLD (hyperlipidemia) LDL optimal on most recent lab work.  Continue current Rx with Crestor 40 mg daily, Zetia 10 mg daily..    Essential hypertension Blood pressure is elevated today.  He notes low blood pressures at home and had to reduce his dose of lisinopril.  I have asked him to monitor his blood pressure over the next 2 weeks and send those readings for review.  If his blood pressure is running above target, consider changing lisinopril to 30 mg daily.  For now, continue lisinopril 20 mg daily, metoprolol tartrate 25 mg twice daily.  Thoracic ascending aortic aneurysm He has had dilation of his aorta on prior studies.  Follow-up CT in November 2022 demonstrated no dilation of his aorta.  Plan is to obtain a chest/aorta MRA now for reassessment.  If this demonstrates a  normal-sized aorta, he will not need further imaging.  Preoperative cardiovascular examination [name] perioperative risk of a major cardiac event is 0.9% according to the Revised Cardiac Risk Index (RCRI).  Therefore, he is at low risk for perioperative complications.   His functional capacity is good at 4.31 METs according to the Duke Activity Status Index (DASI). Recommendations: According to ACC/AHA guidelines, no further cardiovascular testing needed.  The patient may proceed to surgery at acceptable risk.   Antiplatelet and/or Anticoagulation Recommendations: Ideally, Aspirin should be continued without interruption. However, if the bleeding risk is too great, Aspirin can be held for 7 days prior to his surgery.  Please resume Aspirin post operatively when it is felt to be safe from a bleeding standpoint.               Dispo:  Return in about 1 year (around 11/08/2023) for Routine Follow Up, w/ Dr. Burt Knack, or Richardson Dopp, PA-C.   Signed, Richardson Dopp, PA-C  11/07/2022 9:52 AM    [facility] [address], [place], [place]  [address] Phone: [phone]; Fax: [phone]

## 2022-11-07 ENCOUNTER — Ambulatory Visit: Payer: BC Managed Care – PPO | Attending: Physician Assistant | Admitting: Physician Assistant

## 2022-11-07 ENCOUNTER — Encounter: Payer: Self-pay | Admitting: Physician Assistant

## 2022-11-07 VITALS — BP 140/80 | HR 51 | Ht 66.0 in | Wt 179.2 lb

## 2022-11-07 DIAGNOSIS — Q231 Congenital insufficiency of aortic valve: Secondary | ICD-10-CM

## 2022-11-07 DIAGNOSIS — E7800 Pure hypercholesterolemia, unspecified: Secondary | ICD-10-CM | POA: Diagnosis not present

## 2022-11-07 DIAGNOSIS — I2510 Atherosclerotic heart disease of native coronary artery without angina pectoris: Secondary | ICD-10-CM | POA: Diagnosis not present

## 2022-11-07 DIAGNOSIS — Q2381 Bicuspid aortic valve: Secondary | ICD-10-CM

## 2022-11-07 DIAGNOSIS — I7121 Aneurysm of the ascending aorta, without rupture: Secondary | ICD-10-CM

## 2022-11-07 DIAGNOSIS — I10 Essential (primary) hypertension: Secondary | ICD-10-CM | POA: Diagnosis not present

## 2022-11-07 DIAGNOSIS — Z0000 Encounter for general adult medical examination without abnormal findings: Secondary | ICD-10-CM | POA: Insufficient documentation

## 2022-11-07 DIAGNOSIS — Z01810 Encounter for preprocedural cardiovascular examination: Secondary | ICD-10-CM

## 2022-11-07 DIAGNOSIS — I350 Nonrheumatic aortic (valve) stenosis: Secondary | ICD-10-CM

## 2022-11-07 DIAGNOSIS — I712 Thoracic aortic aneurysm, without rupture, unspecified: Secondary | ICD-10-CM

## 2022-11-07 NOTE — Assessment & Plan Note (Signed)
Status post bioprosthetic AVR November 2018.  He had a normally functioning valve on his echocardiogram in 2022.  Continue SBE prophylaxis.

## 2022-11-07 NOTE — Patient Instructions (Addendum)
Medication Instructions:  Your physician recommends that you continue on your current medications as directed. Please refer to the Current Medication list given to you today.  *If you need a refill on your cardiac medications before your next appointment, please call your pharmacy*  Please take your blood pressure daily for 2 weeks and send in a MyChart message. Please include heart rates.   HOW TO TAKE YOUR BLOOD PRESSURE: Rest 5 minutes before taking your blood pressure. Don't smoke or drink caffeinated beverages for at least 30 minutes before. Take your blood pressure before (not after) you eat. Sit comfortably with your back supported and both feet on the floor (don't cross your legs). Elevate your arm to heart level on a table or a desk. Use the proper sized cuff. It should fit smoothly and snugly around your bare upper arm. There should be enough room to slip a fingertip under the cuff. The bottom edge of the cuff should be 1 inch above the crease of the elbow. Ideally, take 3 measurements at one sitting and record the average.    Lab Work: NONE If you have labs (blood work) drawn today and your tests are completely normal, you will receive your results only by: [place] (if you have MyChart) OR A paper copy in the mail If you have any lab test that is abnormal or we need to change your treatment, we will call you to review the results.   Testing/Procedures: Cardiac CT scanning, (CAT scanning), is a noninvasive, special x-ray that produces cross-sectional images of the body using x-rays and a computer. CT scans help physicians diagnose and treat medical conditions. For some CT exams, a contrast material is used to enhance visibility in the area of the body being studied. CT scans provide greater clarity and reveal more details than regular x-ray exams. This will be done at [facility] and someone will reach out to you for scheduling.     Follow-Up: At [facility], you and your health needs are our priority.  As part of our continuing mission to provide you with exceptional heart care, we have created designated Provider Care Teams.  These Care Teams include your primary Cardiologist (physician) and Advanced Practice Providers (APPs -  Physician Assistants and Nurse Practitioners) who all work together to provide you with the care you need, when you need it.  We recommend signing up for the patient portal called "MyChart".  Sign up information is provided on this After Visit Summary.  MyChart is used to connect with patients for Virtual Visits (Telemedicine).  Patients are able to view lab/test results, encounter notes, upcoming appointments, etc.  Non-urgent messages can be sent to your provider as well.   To learn more about what you can do with MyChart, go to [email or web address].    Your next appointment:   1 year(s)  Provider:   Richardson Dopp, PA-C or [name], MD

## 2022-11-07 NOTE — Assessment & Plan Note (Addendum)
James Wilkins perioperative risk of a major cardiac event is 0.9% according to the Revised Cardiac Risk Index (RCRI).  Therefore, he is at low risk for perioperative complications.   His functional capacity is good at 4.31 METs according to the Duke Activity Status Index (DASI). Recommendations: According to ACC/AHA guidelines, no further cardiovascular testing needed.  The patient may proceed to surgery at acceptable risk.   Antiplatelet and/or Anticoagulation Recommendations: Ideally, Aspirin should be continued without interruption. However, if the bleeding risk is too great, Aspirin can be held for 7 days prior to his surgery.  Please resume Aspirin post operatively when it is felt to be safe from a bleeding standpoint.

## 2022-11-07 NOTE — Assessment & Plan Note (Signed)
He has had dilation of his aorta on prior studies.  Follow-up CT in November 2022 demonstrated no dilation of his aorta.  Plan is to obtain a chest/aorta MRA now for reassessment.  If this demonstrates a normal-sized aorta, he will not need further imaging.

## 2022-11-07 NOTE — Assessment & Plan Note (Signed)
Status post CABG plus bioprosthetic AVR in November 2018.  He is doing well without chest pain to suggest angina.  Continue aspirin 81 mg daily, Crestor 40 mg daily, ezetimibe 10 mg daily.  Follow-up in 1 year.

## 2022-11-07 NOTE — Assessment & Plan Note (Signed)
LDL optimal on most recent lab work.  Continue current Rx with Crestor 40 mg daily, Zetia 10 mg daily.Marland Kitchen

## 2022-11-07 NOTE — Assessment & Plan Note (Signed)
Blood pressure is elevated today.  He notes low blood pressures at home and had to reduce his dose of lisinopril.  I have asked him to monitor his blood pressure over the next 2 weeks and send those readings for review.  If his blood pressure is running above target, consider changing lisinopril to 30 mg daily.  For now, continue lisinopril 20 mg daily, metoprolol tartrate 25 mg twice daily.

## 2022-11-09 DIAGNOSIS — I10 Essential (primary) hypertension: Secondary | ICD-10-CM

## 2022-11-09 DIAGNOSIS — I2510 Atherosclerotic heart disease of native coronary artery without angina pectoris: Secondary | ICD-10-CM

## 2022-11-10 ENCOUNTER — Ambulatory Visit: Payer: BC Managed Care – PPO | Attending: Physician Assistant

## 2022-11-10 DIAGNOSIS — I10 Essential (primary) hypertension: Secondary | ICD-10-CM

## 2022-11-10 DIAGNOSIS — I2510 Atherosclerotic heart disease of native coronary artery without angina pectoris: Secondary | ICD-10-CM

## 2022-11-11 ENCOUNTER — Ambulatory Visit (HOSPITAL_COMMUNITY)
Admission: RE | Admit: 2022-11-11 | Discharge: 2022-11-11 | Disposition: A | Discharge status: Home / Self Care | Payer: BC Managed Care – PPO | Source: Ambulatory Visit | Attending: Physician Assistant | Admitting: Physician Assistant

## 2022-11-11 ENCOUNTER — Telehealth: Payer: Self-pay | Admitting: *Deleted

## 2022-11-11 DIAGNOSIS — I712 Thoracic aortic aneurysm, without rupture, unspecified: Secondary | ICD-10-CM | POA: Diagnosis not present

## 2022-11-11 DIAGNOSIS — I77810 Thoracic aortic ectasia: Secondary | ICD-10-CM

## 2022-11-11 LAB — BASIC METABOLIC PANEL
BUN/Creatinine Ratio: 10 (ref 10–24)
BUN: 13 mg/dL (ref 8–27)
CO2: 25 mmol/L (ref 20–29)
Calcium: 9.3 mg/dL (ref 8.6–10.2)
Chloride: 101 mmol/L (ref 96–106)
Creatinine, Ser: 1.33 mg/dL — ABNORMAL HIGH (ref 0.76–1.27)
Glucose: 83 mg/dL (ref 70–99)
Potassium: 4 mmol/L (ref 3.5–5.2)
Sodium: 140 mmol/L (ref 134–144)
eGFR: 60 mL/min/{1.73_m2} (ref >59)

## 2022-11-11 MED ORDER — IOHEXOL 350 MG/ML SOLN
75.0000 mL | Freq: Once | INTRAVENOUS | Status: AC | PRN
  Administered 2022-11-11: 75 mL via INTRAVENOUS

## 2022-11-11 NOTE — Telephone Encounter (Signed)
-----   Message from Liliane Shi, [place] sent at 11/11/2022  1:49 PM EST ----- Results sent to [name] via [place]. See MyChart comment. PLAN: -Arrange f/u Chest/Aorta gated CT in 1 year (dilated ascending aorta) -Send copy to PCP for follow up on benign lung nodules and hepatic hemangioma.  James Wilkins  Your CT shows stable enlargement of your aorta. There are no significant changes. There are some nodules in your right lung which have not changed since 2018 and are considered benign. There is also a hemangioma (blood vessel collection) in the right lobe of the liver. This has previously been evaluated by ultrasound in 2018. Continue current medications/treatment plan and follow up as scheduled. We will repeat your CT again in 1 year.  Richardson Dopp, PA-C    11/11/2022 1:37 PM

## 2022-11-13 ENCOUNTER — Encounter: Payer: BC Managed Care – PPO | Admitting: Internal Medicine

## 2023-02-13 ENCOUNTER — Other Ambulatory Visit: Payer: Self-pay | Admitting: Physician Assistant

## 2023-02-23 ENCOUNTER — Other Ambulatory Visit: Payer: Self-pay | Admitting: Physician Assistant

## 2023-04-03 ENCOUNTER — Other Ambulatory Visit: Payer: Self-pay | Admitting: Physician Assistant

## 2023-05-14 ENCOUNTER — Other Ambulatory Visit: Payer: Self-pay | Admitting: Cardiovascular Disease

## 2023-08-12 DIAGNOSIS — M545 Low back pain, unspecified: Secondary | ICD-10-CM | POA: Insufficient documentation

## 2023-08-12 DIAGNOSIS — M5459 Other low back pain: Secondary | ICD-10-CM | POA: Diagnosis not present

## 2023-08-13 DIAGNOSIS — M12811 Other specific arthropathies, not elsewhere classified, right shoulder: Secondary | ICD-10-CM | POA: Insufficient documentation

## 2023-08-13 DIAGNOSIS — M25811 Other specified joint disorders, right shoulder: Secondary | ICD-10-CM | POA: Diagnosis not present

## 2023-09-01 DIAGNOSIS — M5416 Radiculopathy, lumbar region: Secondary | ICD-10-CM | POA: Diagnosis not present

## 2023-09-04 DIAGNOSIS — E291 Testicular hypofunction: Secondary | ICD-10-CM | POA: Diagnosis not present

## 2023-09-04 DIAGNOSIS — E039 Hypothyroidism, unspecified: Secondary | ICD-10-CM | POA: Diagnosis not present

## 2023-09-04 DIAGNOSIS — Z125 Encounter for screening for malignant neoplasm of prostate: Secondary | ICD-10-CM | POA: Diagnosis not present

## 2023-09-04 DIAGNOSIS — E785 Hyperlipidemia, unspecified: Secondary | ICD-10-CM | POA: Diagnosis not present

## 2023-09-04 DIAGNOSIS — D649 Anemia, unspecified: Secondary | ICD-10-CM | POA: Diagnosis not present

## 2023-09-04 DIAGNOSIS — N1831 Chronic kidney disease, stage 3a: Secondary | ICD-10-CM | POA: Diagnosis not present

## 2023-09-09 DIAGNOSIS — M545 Low back pain, unspecified: Secondary | ICD-10-CM | POA: Diagnosis not present

## 2023-09-11 DIAGNOSIS — Z0000 Encounter for general adult medical examination without abnormal findings: Secondary | ICD-10-CM | POA: Diagnosis not present

## 2023-09-11 DIAGNOSIS — Z23 Encounter for immunization: Secondary | ICD-10-CM | POA: Diagnosis not present

## 2023-09-11 DIAGNOSIS — I129 Hypertensive chronic kidney disease with stage 1 through stage 4 chronic kidney disease, or unspecified chronic kidney disease: Secondary | ICD-10-CM | POA: Diagnosis not present

## 2023-09-11 DIAGNOSIS — Z1339 Encounter for screening examination for other mental health and behavioral disorders: Secondary | ICD-10-CM | POA: Diagnosis not present

## 2023-09-11 DIAGNOSIS — Z1331 Encounter for screening for depression: Secondary | ICD-10-CM | POA: Diagnosis not present

## 2023-09-14 DIAGNOSIS — M25811 Other specified joint disorders, right shoulder: Secondary | ICD-10-CM | POA: Diagnosis not present

## 2023-09-26 ENCOUNTER — Other Ambulatory Visit: Payer: Self-pay | Admitting: Physician Assistant

## 2023-10-01 ENCOUNTER — Other Ambulatory Visit: Payer: Self-pay

## 2023-10-01 DIAGNOSIS — D509 Iron deficiency anemia, unspecified: Secondary | ICD-10-CM | POA: Insufficient documentation

## 2023-10-01 DIAGNOSIS — I129 Hypertensive chronic kidney disease with stage 1 through stage 4 chronic kidney disease, or unspecified chronic kidney disease: Secondary | ICD-10-CM | POA: Insufficient documentation

## 2023-10-01 DIAGNOSIS — N1831 Chronic kidney disease, stage 3a: Secondary | ICD-10-CM | POA: Insufficient documentation

## 2023-10-01 DIAGNOSIS — D649 Anemia, unspecified: Secondary | ICD-10-CM | POA: Insufficient documentation

## 2023-10-01 DIAGNOSIS — H9193 Unspecified hearing loss, bilateral: Secondary | ICD-10-CM | POA: Insufficient documentation

## 2023-10-01 DIAGNOSIS — G3184 Mild cognitive impairment, so stated: Secondary | ICD-10-CM | POA: Insufficient documentation

## 2023-10-01 DIAGNOSIS — K760 Fatty (change of) liver, not elsewhere classified: Secondary | ICD-10-CM | POA: Insufficient documentation

## 2023-10-06 DIAGNOSIS — M5459 Other low back pain: Secondary | ICD-10-CM | POA: Diagnosis not present

## 2023-10-06 DIAGNOSIS — M5416 Radiculopathy, lumbar region: Secondary | ICD-10-CM | POA: Diagnosis not present

## 2023-10-27 ENCOUNTER — Encounter: Payer: Self-pay | Admitting: Vascular Surgery

## 2023-10-27 ENCOUNTER — Ambulatory Visit: Payer: BC Managed Care – PPO | Admitting: Vascular Surgery

## 2023-10-27 VITALS — BP 136/70 | HR 55 | Temp 98.1°F | Resp 18 | Ht 66.0 in | Wt 176.8 lb

## 2023-10-27 DIAGNOSIS — G8929 Other chronic pain: Secondary | ICD-10-CM

## 2023-10-27 DIAGNOSIS — M545 Low back pain, unspecified: Secondary | ICD-10-CM | POA: Diagnosis not present

## 2023-10-27 NOTE — Progress Notes (Signed)
Patient name: James Wilkins MRN: [id number] DOB: 1961/04/16 Sex: male  REASON FOR CONSULT: Evaluate for anterior spine exposure for L4-S1 ALIF  HPI: James Wilkins is a 63 y.o. male, with history of CAD, hypertension, hyperlipidemia, aortic valve replacement with CABG that presents for evaluation of anterior spine exposure for L4-S1 ALIF.  Patient describes chronic lower back pain.  States has had multiple posterior back surgeries with Dr. Franky Macho.  Now evaluated by Dr. Shon Baton.  Dr. Shon Baton wants [place] to evaluate for L4-S1 ALIF versus TLIF.  Noted to have significant calcification on his arteries from prior lumbar xray.  Previous abdominal surgery includes a bladder surgery when he was 18 through a lower midline incision and states he had a suprapubic tube.  Past Medical History:  Diagnosis Date   Aortic stenosis    s/p AVR // Echocardiogram 10/22: EF 60-65, no RWMA, mild LVH, Gr 1 DD, normal RVSF, trivial MR, s/p AVR with normal function (no AS, no AI, mean 8.4 mmHg), mild dilation of ascending aorta (43 mm)   Arthritis    "joints" (08/27/2017)   Bicuspid aortic valve    Celiac disease    Coronary artery disease involving native coronary artery of native heart with angina pectoris (HCC)    Dilation of esophagus    tortuous esophagus   Dyspnea    Gastritis    non erosive   GERD (gastroesophageal reflux disease)    Heart murmur    "ever since I was little" (08/27/2017)   Hemorrhoids    Hiatal hernia    HLD (hyperlipidemia)    Hypertension    Hypothyroidism    IDA (iron deficiency anemia)    S/P aortic valve replacement with bioprosthetic valve 08/25/2017   25 mm Edwards Intuity Elite bovine pericardial stented rapid deployment valve   Thoracic ascending aortic aneurysm (HCC)    CT 2018: 4 cm // Echo 2022: 4.3 cm // CT 11/22: 3.7 cm    Past Surgical History:  Procedure Laterality Date   AORTIC VALVE REPLACEMENT N/A 08/25/2017   Procedure: AORTIC VALVE REPLACEMENT (AVR);  Surgeon:  Purcell Nails, MD;  Location: [facility] OR;  Service: Open Heart Surgery;  Laterality: N/A;   AORTIC VALVE REPLACEMENT (AVR)/CORONARY ARTERY BYPASS GRAFTING (CABG)  08/25/2017   25 mm Edwards Intuity Elite bovine pericardial stented rapid deployment valve   BACK SURGERY     BLADDER SURGERY     "weak spots in my bladder; they took them out"   CARDIAC CATHETERIZATION  08/12/2017   CARDIAC VALVE REPLACEMENT     COLONOSCOPY  2009   CORONARY ANGIOPLASTY     CORONARY ARTERY BYPASS GRAFT  08/25/2017   LIMA to LAD, SVG to Ramus Intermediate, EVH via left thigh   CORONARY ARTERY BYPASS GRAFT N/A 08/25/2017   Procedure: CORONARY ARTERY BYPASS GRAFTING (CABG) x two, using left internal mammary artery and left leg greater saphenous vein harvested endoscopically;  Surgeon: Purcell Nails, MD;  Location: [facility] OR;  Service: Open Heart Surgery;  Laterality: N/A;   ESOPHAGOGASTRODUODENOSCOPY (EGD) WITH ESOPHAGEAL DILATION  2008   HEMORRHOID BANDING     LUMBAR DISC SURGERY  1999; 2016 X 2   SHOULDER ARTHROSCOPY W/ ROTATOR CUFF REPAIR Right 2012   SHOULDER ARTHROSCOPY W/ ROTATOR CUFF REPAIR Left 2014 X 2   TEE WITHOUT CARDIOVERSION N/A 08/25/2017   Procedure: TRANSESOPHAGEAL ECHOCARDIOGRAM (TEE);  Surgeon: Purcell Nails, MD;  Location: [facility] OR;  Service: Open Heart Surgery;  Laterality: N/A;  UPPER GASTROINTESTINAL ENDOSCOPY      Family History  Problem Relation Age of Onset   Heart disease Mother    Diabetes Father        Type II   Brain cancer Father    Heart disease Father    Colon polyps Sister    Diabetes Paternal Uncle    Cancer Other    Hypertension Other    Colon cancer Neg Hx    Kidney disease Neg Hx    Esophageal cancer Neg Hx    Stomach cancer Neg Hx    Rectal cancer Neg Hx     SOCIAL HISTORY: Social History   Socioeconomic History   Marital status: Married    Spouse name: Not on file   Number of children: 0   Years of education: Not on file   Highest education level: Not on  file  Occupational History   Occupation: Tour manager  Tobacco Use   Smoking status: Never   Smokeless tobacco: Never  Vaping Use   Vaping status: Never Used  Substance and Sexual Activity   Alcohol use: No   Drug use: No   Sexual activity: Yes  Other Topics Concern   Not on file  Social History Narrative   Married no children works as a Tour manager in [employer]   Social Drivers of Corporate investment banker Strain: Not on file  Food Insecurity: Not on file  Transportation Needs: Not on file  Physical Activity: Not on file  Stress: Not on file  Social Connections: Not on file  Intimate Partner Violence: Not on file    Allergies  Allergen Reactions   Gluten Meal     Other reaction(s): Celiac Disease   Wheat Diarrhea    Barley,rye    Current Outpatient Medications  Medication Sig Dispense Refill   acetaminophen (TYLENOL) 500 MG tablet Take 500 mg by mouth every 6 (six) hours as needed for mild pain, headache or moderate pain.     aspirin EC 81 MG tablet      ezetimibe (ZETIA) 10 MG tablet TAKE 1 TABLET(10 MG) BY MOUTH DAILY 90 tablet 0   gabapentin (NEURONTIN) 300 MG capsule Take 300 mg by mouth 3 (three) times daily.     GABAPENTIN EX Take 300 mg by mouth at bedtime.     hydrocortisone (PROCTOZONE-HC) 2.5 % rectal cream 1 application Externally Twice a day for 30 days     levothyroxine (SYNTHROID) 75 MCG tablet Take 75 mcg by mouth daily before breakfast.     lisinopril (ZESTRIL) 20 MG tablet TAKE 1 TABLET BY MOUTH EVERY DAY for 90     metoprolol tartrate (LOPRESSOR) 25 MG tablet Take 1 tablet by mouth 2 (two) times daily.     Multiple Vitamins-Minerals (CENTRUM SILVER ULTRA MENS PO) Take 1 tablet by mouth daily.     rosuvastatin (CRESTOR) 40 MG tablet 1 tablet Orally Once a day for 90 days     sildenafil (VIAGRA) 100 MG tablet 1 to 1/2 tablet as needed Orally Once a day for 30 days     Ferric Maltol (ACCRUFER) 30 MG CAPS 1 capsule 1 hour before or 2  hours after meals Orally Twice a day for 30 days (Patient not taking: Reported on 10/27/2023)     Ferrous Sulfate (IRON) 325 (65 Fe) MG TABS Take 1 tablet by mouth daily. (Patient not taking: Reported on 10/27/2023)     iron polysaccharides (NIFEREX) 150 MG capsule 1 tablet Orally daily for  30 days (Patient not taking: Reported on 10/27/2023)     lisinopril (ZESTRIL) 20 MG tablet Take 1 tablet (20 mg total) by mouth daily. 90 tablet 2   meloxicam (MOBIC) 15 MG tablet Take 15 mg by mouth daily. (Patient not taking: Reported on 10/27/2023)     Na Sulfate-K Sulfate-Mg Sulf 17.5-3.13-1.6 GM/177ML SOLN  (Patient not taking: Reported on 10/27/2023)     No current facility-administered medications for this visit.    REVIEW OF SYSTEMS:  [X]  denotes positive finding, [ ]  denotes negative finding Cardiac  Comments:  Chest pain or chest pressure:    Shortness of breath upon exertion:    Short of breath when lying flat:    Irregular heart rhythm:        Vascular    Pain in calf, thigh, or hip brought on by ambulation:    Pain in feet at night that wakes you up from your sleep:     Blood clot in your veins:    Leg swelling:         Pulmonary    Oxygen at home:    Productive cough:     Wheezing:         Neurologic    Sudden weakness in arms or legs:     Sudden numbness in arms or legs:     Sudden onset of difficulty speaking or slurred speech:    Temporary loss of vision in one eye:     Problems with dizziness:         Gastrointestinal    Blood in stool:     Vomited blood:         Genitourinary    Burning when urinating:     Blood in urine:        Psychiatric    Major depression:         Hematologic    Bleeding problems:    Problems with blood clotting too easily:        Skin    Rashes or ulcers:        Constitutional    Fever or chills:      PHYSICAL EXAM: Vitals:   10/27/23 1113  BP: 136/70  Pulse: (!) 55  Resp: 18  Temp: 98.1 F (36.7 C)  TempSrc: Temporal  SpO2:  98%  Weight: 176 lb 12.8 oz (80.2 kg)  Height: 5\' 6"  (1.676 m)    GENERAL: The patient is a well-nourished male, in no acute distress. The vital signs are documented above. CARDIAC: There is a regular rate and rhythm.  VASCULAR:  Bilateral femoral pulses palpable Difficult to appreciate pedal pulses PULMONARY: No respiratory distress. ABDOMEN: Soft and non-tender.  Lower midline incision. MUSCULOSKELETAL: There are no major deformities or cyanosis. NEUROLOGIC: No focal weakness or paresthesias are detected. SKIN: There are no ulcers or rashes noted. PSYCHIATRIC: The patient has a normal affect.  DATA:   MRI reviewed from 09/01/2023    Assessment/Plan:  63 y.o. male, with history of CAD, hypertension, hyperlipidemia, aortic valve replacement with CABG the presents for evaluation of anterior spine exposure for L4-S1 ALIF.  Patient describes chronic lower back pain.  States has had multiple posterior back surgeries with Dr. Franky Macho.  I discussed an anterior approach for two-level exposure is certainly more involved.  He has significant calcification of his aorta iliac arteries based on his previous plain films of the lumbar spine.  I have recommended a CT abdomen pelvis without contrast to further evaluate his anatomy  and decide if we think we can do an anterior approach safely.  This would involve a paramedian incision and then mobilizing his left rectus to enter into the retroperitoneum and mobilizing peritoneum and left ureter across midline including mobilizing iliac artery and vein.  If he has circumferential diffuse calcification that may make this difficult but will further evaluate with an anatomy scan.   Cephus Shelling, MD [facility] Office: [phone]

## 2023-11-03 ENCOUNTER — Encounter: Payer: BC Managed Care – PPO | Admitting: Vascular Surgery

## 2023-11-05 DIAGNOSIS — M5416 Radiculopathy, lumbar region: Secondary | ICD-10-CM | POA: Diagnosis not present

## 2023-11-05 DIAGNOSIS — M5459 Other low back pain: Secondary | ICD-10-CM | POA: Diagnosis not present

## 2023-11-06 ENCOUNTER — Telehealth: Payer: Self-pay | Admitting: Cardiovascular Disease

## 2023-11-06 ENCOUNTER — Other Ambulatory Visit: Payer: Self-pay

## 2023-11-06 DIAGNOSIS — G8929 Other chronic pain: Secondary | ICD-10-CM

## 2023-11-06 NOTE — Telephone Encounter (Signed)
   Pre-operative Risk Assessment    Patient Name: James Wilkins  DOB: Feb 08, 1961 MRN: [id number]   Date of last office visit: 11/07/2022 Date of next office visit: 11/20/2023   Request for Surgical Clearance    Procedure:   Transforaminal lumbar interbody fusion  Date of Surgery:  Clearance 12/03/23                                Surgeon:  Dr. Venita Lick Surgeon's Group or Practice Name:  [facility] Phone number:  [phone] Fax number:  [phone] [name]   Type of Clearance Requested:   - Medical  - Pharmacy:  Hold Aspirin     Type of Anesthesia:  General    Additional requests/questions:    [name]   11/06/2023, 4:20 PM

## 2023-11-06 NOTE — Telephone Encounter (Signed)
   Name: James Wilkins  DOB: 1960-12-17  MRN: [id number]  Primary Cardiologist: Tonny Bollman, MD  Chart reviewed as part of pre-operative protocol coverage. The patient has an upcoming visit scheduled with Tereso Newcomer, PA on 12/03/2023 at which time clearance can be addressed in case there are any issues that would impact surgical recommendations.  Transforaminal lumbar interbody fusion is not scheduled until 12/03/2023 as below. I added preop FYI to appointment note so that provider is aware to address at time of outpatient visit.  Per office protocol the cardiology provider should forward their finalized clearance decision and recommendations regarding antiplatelet therapy to the requesting party below.    I will route this message as FYI to requesting party and remove this message from the preop box as separate preop APP input not needed at this time.   Please call with any questions.  Joylene Grapes, NP  11/06/2023, 4:27 PM

## 2023-11-09 ENCOUNTER — Ambulatory Visit (HOSPITAL_COMMUNITY): Payer: Self-pay | Admitting: Orthopedic Surgery

## 2023-11-11 ENCOUNTER — Other Ambulatory Visit: Payer: Self-pay | Admitting: Physician Assistant

## 2023-11-16 ENCOUNTER — Other Ambulatory Visit: Payer: Self-pay | Admitting: Physician Assistant

## 2023-11-18 NOTE — Progress Notes (Unsigned)
Cardiology Office Note:    Date:  11/20/2023  ID:  James Wilkins, DOB 1961-07-05, MRN [id number] PCP: Cleatis Polka., MD  [facility] Providers Cardiologist:  Tonny Bollman, MD Cardiology APP:  Beatrice Lecher, PA-C       Patient Profile:      Coronary artery disease, aortic stenosis S/p CABG + bioprosthetic AVR 08/2017 TTE 07/26/21: EF 60-65, no RWMA, mild LVH, G1 DD, normal RVSF, trivial MR, AVR without AI or AS, ascending aorta 43 mm Mildly dilated ascending aorta CTA 11/11/22: Asc thoracic aorta ectasia 39 mm Hypertension Hyperlipidemia Chronic kidney disease  LBBB GERD Carotid US 08/21/17: bilat 1-39 ABIs 07/19/21: normal           James Wilkins is a 63 y.o. male who returns for follow up of CAD, surgical clearance.  He needs to undergo lumbar spine surgery with Dr. Shon Baton on 12/03/2023 under general anesthesia.  Aspirin needs to be held.  He is here alone.  His back limits him but he is still able to work.  He is able to climb steps or walk a block or 2 without stopping.  He has not had chest discomfort.  He has not had significant shortness of breath.  He has occasional dizziness.  He has not had syncope, orthopnea, leg edema.  ROS-See HPI    Studies Reviewed:   EKG Interpretation Date/Time:  Friday November 20 2023 09:02:31 EST Ventricular Rate:  48 PR Interval:  202 QRS Duration:  170 QT Interval:  462 QTC Calculation: 412 R Axis:   15  Text Interpretation: Sinus bradycardia Left bundle branch block Confirmed by Tereso Newcomer [phone]) on 11/20/2023 9:26:57 AM   Labs reviewed-Per Benjamin Stain 09/07/2023: Total cholesterol 130, HDL 53, LDL 64, triglycerides 63, ALT 37  Risk Assessment/Calculations:     HYPERTENSION CONTROL Vitals:   11/20/23 0857 11/20/23 0904 11/20/23 0950  BP: (!) 170/78 (!) 142/78 (!) 164/80    The patient's blood pressure is elevated above target today.  In order to address the patient's elevated BP: Blood pressure will be monitored  at home to determine if medication changes need to be made.; The blood pressure is usually elevated in clinic.  Blood pressures monitored at home have been optimal.; A referral to the PharmD Hypertension Clinic will be placed.          Physical Exam:   VS:  BP (!) 164/80   Pulse (!) 50   Resp 16   Ht 5\' 6"  (1.676 m)   Wt 176 lb (79.8 kg)   SpO2 98%   BMI 28.41 kg/m    Wt Readings from Last 3 Encounters:  11/20/23 176 lb (79.8 kg)  10/27/23 176 lb 12.8 oz (80.2 kg)  11/07/22 179 lb 3.2 oz (81.3 kg)    Constitutional:      Appearance: Healthy appearance. Not in distress.  Neck:     Vascular: No JVR. JVD normal.  Pulmonary:     Breath sounds: Normal breath sounds. No wheezing. No rales.  Cardiovascular:     Normal rate. Regular rhythm.     Murmurs: There is a grade 1/6 systolic murmur at the URSB.  Edema:    Peripheral edema absent.  Abdominal:     Palpations: Abdomen is soft.        Assessment and Plan:   Assessment & Plan Preoperative cardiovascular examination Mr. Spalla perioperative risk of a major cardiac event is 0.9% according to the Revised Cardiac Risk Index (  RCRI).  Therefore, he is at low risk for perioperative complications.   His functional capacity is good at 4.31 METs according to the Duke Activity Status Index (DASI). Recommendations: According to ACC/AHA guidelines, no further cardiovascular testing needed.  The patient may proceed to surgery at acceptable risk.   Antiplatelet and/or Anticoagulation Recommendations: Aspirin can be held for 7 days prior to his surgery.  Please resume Aspirin post operatively when it is felt to be safe from a bleeding standpoint.  Coronary artery disease involving native coronary artery of native heart without angina pectoris Status post CABG plus bioprosthetic AVR in November 2018.  He is doing well without chest discomfort to suggest angina. -Continue ASA 81 mg daily -Continue Crestor 40 mg daily -Continue Zetia 10 mg  daily Ectatic thoracic aorta (HCC) 39 mm on CT in 11/2022.  Follow-up chest CT pending this year.  If there is no significant change compared to last year, I do not think he we will need to continue with annual studies. Essential hypertension Blood pressure uncontrolled.  He notes optimal blood pressures at home.  He has a new machine.  Blood pressures at primary care tend to be normal per his report.  Question if he has a component of whitecoat hypertension. -Continue to monitor -Refer to Pharm.D. hypertension clinic to assess blood pressure machine, blood pressure control -Continue lisinopril 20 mg daily -Decrease metoprolol 2/2 bradycardia as noted below Pure hypercholesterolemia Labs from primary care reviewed.  Recent LDL optimal.   -Continue Crestor 40 mg daily -Continue Zetia 10 mg daily Bicuspid aortic valve Status post bioprosthetic AVR November 2018. AVR ok on TTE in 2022. Continue SBE prophylaxis.  Repeat echocardiogram in 1 year prior to next visit. Sinus bradycardia Heart rate in the 40s today.  He notes occasional dizziness which could be symptoms of vertigo. -Decrease metoprolol to tartrate to 12.5 mg daily -If heart rate continues to remain less than 50, will discontinue beta-blocker.      Dispo:  Return in about 1 year (around 11/19/2024) for Routine Follow Up, w/ Dr. Excell Seltzer, or Tereso Newcomer, PA-C.  Signed, Tereso Newcomer, PA-C

## 2023-11-20 ENCOUNTER — Encounter: Payer: Self-pay | Admitting: Physician Assistant

## 2023-11-20 ENCOUNTER — Ambulatory Visit: Payer: BC Managed Care – PPO | Attending: Physician Assistant | Admitting: Physician Assistant

## 2023-11-20 VITALS — BP 164/80 | HR 50 | Resp 16 | Ht 66.0 in | Wt 176.0 lb

## 2023-11-20 DIAGNOSIS — R001 Bradycardia, unspecified: Secondary | ICD-10-CM

## 2023-11-20 DIAGNOSIS — I2510 Atherosclerotic heart disease of native coronary artery without angina pectoris: Secondary | ICD-10-CM

## 2023-11-20 DIAGNOSIS — I10 Essential (primary) hypertension: Secondary | ICD-10-CM | POA: Diagnosis not present

## 2023-11-20 DIAGNOSIS — E7800 Pure hypercholesterolemia, unspecified: Secondary | ICD-10-CM

## 2023-11-20 DIAGNOSIS — I77810 Thoracic aortic ectasia: Secondary | ICD-10-CM

## 2023-11-20 DIAGNOSIS — Q2381 Bicuspid aortic valve: Secondary | ICD-10-CM

## 2023-11-20 DIAGNOSIS — Z01810 Encounter for preprocedural cardiovascular examination: Secondary | ICD-10-CM

## 2023-11-20 MED ORDER — METOPROLOL TARTRATE 25 MG PO TABS: 12.5000 mg | ORAL_TABLET | Freq: Two times a day (BID) | ORAL | 3 refills | Status: AC

## 2023-11-20 NOTE — Assessment & Plan Note (Addendum)
Status post bioprosthetic AVR November 2018. AVR ok on TTE in 2022. Continue SBE prophylaxis.  Repeat echocardiogram in 1 year prior to next visit.

## 2023-11-20 NOTE — Assessment & Plan Note (Signed)
Status post CABG plus bioprosthetic AVR in November 2018.  He is doing well without chest discomfort to suggest angina. -Continue ASA 81 mg daily -Continue Crestor 40 mg daily -Continue Zetia 10 mg daily

## 2023-11-20 NOTE — Assessment & Plan Note (Signed)
39 mm on CT in 11/2022.  Follow-up chest CT pending this year.  If there is no significant change compared to last year, I do not think he we will need to continue with annual studies.

## 2023-11-20 NOTE — Assessment & Plan Note (Signed)
Labs from primary care reviewed.  Recent LDL optimal.   -Continue Crestor 40 mg daily -Continue Zetia 10 mg daily

## 2023-11-20 NOTE — Patient Instructions (Addendum)
Medication Instructions:  Your physician has recommended you make the following change in your medication:   REDUCE the Lopressor to 1/2 tablet twice a day  Ok to hold your Aspirin y days prior to your surgery and restart when your Surgeon says it's ok  *If you need a refill on your cardiac medications before your next appointment, please call your pharmacy*   Lab Work: None ordered  If you have labs (blood work) drawn today and your tests are completely normal, you will receive your results only by: MyChart Message (if you have MyChart) OR A paper copy in the mail If you have any lab test that is abnormal or we need to change your treatment, we will call you to review the results.   Testing/Procedures: Non-Cardiac CT Angiography (CTA), is a special type of CT scan that uses a computer to produce multi-dimensional views of major blood vessels throughout the body. In CT angiography, a contrast material is injected through an IV to help visualize the blood vessels  You have been referred to Pharmacy team for Hypertension, next month.  Bring your blood pressure machine with you   Follow-Up: At [facility], you and your health needs are our priority.  As part of our continuing mission to provide you with exceptional heart care, we have created designated Provider Care Teams.  These Care Teams include your primary Cardiologist (physician) and Advanced Practice Providers (APPs -  Physician Assistants and Nurse Practitioners) who all work together to provide you with the care you need, when you need it.  We recommend signing up for the patient portal called "MyChart".  Sign up information is provided on this After Visit Summary.  MyChart is used to connect with patients for Virtual Visits (Telemedicine).  Patients are able to view lab/test results, encounter notes, upcoming appointments, etc.  Non-urgent messages can be sent to your provider as well.   To learn more about what you can  do with MyChart, go to [email or web address].    Your next appointment:   12 month(s)  Provider:   Tonny Bollman, MD  or Tereso Newcomer, PA-C         Other Instructions     1st Floor: - Lobby - Registration  - Pharmacy  - Lab - Cafe  2nd Floor: - PV Lab - Diagnostic Testing (echo, CT, nuclear med)  3rd Floor: - Vacant  4th Floor: - TCTS (cardiothoracic surgery) - AFib Clinic - Structural Heart Clinic - Vascular Surgery  - Vascular Ultrasound  5th Floor: - [facility]Care Cardiology (general and EP) - Clinical Pharmacy for coumadin, hypertension, lipid, weight-loss medications, and med management appointments    Valet parking services will be available as well.

## 2023-11-20 NOTE — Assessment & Plan Note (Signed)
Blood pressure uncontrolled.  He notes optimal blood pressures at home.  He has a new machine.  Blood pressures at primary care tend to be normal per his report.  Question if he has a component of whitecoat hypertension. -Continue to monitor -Refer to Pharm.D. hypertension clinic to assess blood pressure machine, blood pressure control -Continue lisinopril 20 mg daily -Decrease metoprolol 2/2 bradycardia as noted below

## 2023-11-24 ENCOUNTER — Ambulatory Visit: Payer: BC Managed Care – PPO | Admitting: Vascular Surgery

## 2023-11-25 NOTE — Pre-Procedure Instructions (Signed)
 Surgical Instructions   Your procedure is scheduled on Thursday, February 27th. Report to [facility] Main Entrance "A" at 08:30 A.M., then check in with the Admitting office. Any questions or running late day of surgery: call [phone]  Questions prior to your surgery date: call [phone], Monday-Friday, 8am-4pm. If you experience any cold or flu symptoms such as cough, fever, chills, shortness of breath, etc. between now and your scheduled surgery, please notify us at the above number.     Remember:  Do not eat after midnight the night before your surgery  You may drink clear liquids until 07:30 AM the morning of your surgery.   Clear liquids allowed are: Water, Non-Citrus Juices (without pulp), Carbonated Beverages, Clear Tea (no milk, honey, etc.), Black Coffee Only (NO MILK, CREAM OR POWDERED CREAMER of any kind), and Gatorade.    Take these medicines the morning of surgery with A SIP OF WATER  ezetimibe (ZETIA)  gabapentin (NEURONTIN)  levothyroxine (SYNTHROID)  metoprolol tartrate (LOPRESSOR)  rosuvastatin (CRESTOR)    May take these medicines IF NEEDED: acetaminophen (TYLENOL)     Hold Aspirin 7 days prior to surgery.  One week prior to surgery, STOP taking any Aleve, Naproxen, Ibuprofen, Motrin, Advil, Goody's, BC's, all herbal medications, fish oil, and non-prescription vitamins.                     Do NOT Smoke (Tobacco/Vaping) for 24 hours prior to your procedure.  If you use a CPAP at night, you may bring your mask/headgear for your overnight stay.   You will be asked to remove any contacts, glasses, piercing's, hearing aid's, dentures/partials prior to surgery. Please bring cases for these items if needed.    Patients discharged the day of surgery will not be allowed to drive home, and someone needs to stay with them for 24 hours.  SURGICAL WAITING ROOM VISITATION Patients may have no more than 2 support people in the waiting area - these visitors may  rotate.   Pre-op nurse will coordinate an appropriate time for 1 ADULT support person, who may not rotate, to accompany patient in pre-op.  Children under the age of 38 must have an adult with them who is not the patient and must remain in the main waiting area with an adult.  If the patient needs to stay at the hospital during part of their recovery, the visitor guidelines for inpatient rooms apply.  Please refer to the [facility] website for the visitor guidelines for any additional information.   If you received a COVID test during your pre-op visit  it is requested that you wear a mask when out in public, stay away from anyone that may not be feeling well and notify your surgeon if you develop symptoms. If you have been in contact with anyone that has tested positive in the last 10 days please notify you surgeon.      Pre-operative 5 CHG Bathing Instructions   You can play a key role in reducing the risk of infection after surgery. Your skin needs to be as free of germs as possible. You can reduce the number of germs on your skin by washing with CHG (chlorhexidine gluconate) soap before surgery. CHG is an antiseptic soap that kills germs and continues to kill germs even after washing.   DO NOT use if you have an allergy to chlorhexidine/CHG or antibacterial soaps. If your skin becomes reddened or irritated, stop using the CHG and notify one of  our RNs at [phone].   Please shower with the CHG soap starting 4 days before surgery using the following schedule:     Please keep in mind the following:  DO NOT shave, including legs and underarms, starting the day of your first shower.   You may shave your face at any point before/day of surgery.  Place clean sheets on your bed the day you start using CHG soap. Use a clean washcloth (not used since being washed) for each shower. DO NOT sleep with pets once you start using the CHG.   CHG Shower Instructions:  Wash your face and  private area with normal soap. If you choose to wash your hair, wash first with your normal shampoo.  After you use shampoo/soap, rinse your hair and body thoroughly to remove shampoo/soap residue.  Turn the water OFF and apply about 3 tablespoons (45 ml) of CHG soap to a CLEAN washcloth.  Apply CHG soap ONLY FROM YOUR NECK DOWN TO YOUR TOES (washing for 3-5 minutes)  DO NOT use CHG soap on face, private areas, open wounds, or sores.  Pay special attention to the area where your surgery is being performed.  If you are having back surgery, having someone wash your back for you may be helpful. Wait 2 minutes after CHG soap is applied, then you may rinse off the CHG soap.  Pat dry with a clean towel  Put on clean clothes/pajamas   If you choose to wear lotion, please use ONLY the CHG-compatible lotions that are listed below.  Additional instructions for the day of surgery: DO NOT APPLY any lotions, deodorants, cologne, or perfumes.   Do not bring valuables to the hospital. [facility] is not responsible for any belongings/valuables. Do not wear nail polish, gel polish, artificial nails, or any other type of covering on natural nails (fingers and toes) Do not wear jewelry or makeup Put on clean/comfortable clothes.  Please brush your teeth.  Ask your nurse before applying any prescription medications to the skin.     CHG Compatible Lotions   Aveeno Moisturizing lotion  Cetaphil Moisturizing Cream  Cetaphil Moisturizing Lotion  Clairol Herbal Essence Moisturizing Lotion, Dry Skin  Clairol Herbal Essence Moisturizing Lotion, Extra Dry Skin  Clairol Herbal Essence Moisturizing Lotion, Normal Skin  Curel Age Defying Therapeutic Moisturizing Lotion with Alpha Hydroxy  Curel Extreme Care Body Lotion  Curel Soothing Hands Moisturizing Hand Lotion  Curel Therapeutic Moisturizing Cream, Fragrance-Free  Curel Therapeutic Moisturizing Lotion, Fragrance-Free  Curel Therapeutic Moisturizing  Lotion, Original Formula  Eucerin Daily Replenishing Lotion  Eucerin Dry Skin Therapy Plus Alpha Hydroxy Crme  Eucerin Dry Skin Therapy Plus Alpha Hydroxy Lotion  Eucerin Original Crme  Eucerin Original Lotion  Eucerin Plus Crme Eucerin Plus Lotion  Eucerin TriLipid Replenishing Lotion  Keri Anti-Bacterial Hand Lotion  Keri Deep Conditioning Original Lotion Dry Skin Formula Softly Scented  Keri Deep Conditioning Original Lotion, Fragrance Free Sensitive Skin Formula  Keri Lotion Fast Absorbing Fragrance Free Sensitive Skin Formula  Keri Lotion Fast Absorbing Softly Scented Dry Skin Formula  Keri Original Lotion  Keri Skin Renewal Lotion Keri Silky Smooth Lotion  Keri Silky Smooth Sensitive Skin Lotion  Nivea Body Creamy Conditioning Oil  Nivea Body Extra Enriched Teacher, adult education Moisturizing Lotion Nivea Crme  Nivea Skin Firming Lotion  NutraDerm 30 Skin Lotion  NutraDerm Skin Lotion  NutraDerm Therapeutic Skin Cream  NutraDerm Therapeutic Skin Lotion  ProShield Protective Hand Cream  Provon moisturizing lotion  Please read over the following fact sheets that you were given.

## 2023-11-26 DIAGNOSIS — M5416 Radiculopathy, lumbar region: Secondary | ICD-10-CM | POA: Diagnosis not present

## 2023-11-26 DIAGNOSIS — M5451 Vertebrogenic low back pain: Secondary | ICD-10-CM | POA: Diagnosis not present

## 2023-11-27 ENCOUNTER — Ambulatory Visit (HOSPITAL_COMMUNITY)
Admission: RE | Admit: 2023-11-27 | Discharge: 2023-11-27 | Disposition: A | Discharge status: Home / Self Care | Payer: BC Managed Care – PPO | Source: Ambulatory Visit | Attending: Physician Assistant | Admitting: Physician Assistant

## 2023-11-27 ENCOUNTER — Encounter (HOSPITAL_COMMUNITY): Payer: Self-pay

## 2023-11-27 ENCOUNTER — Encounter (HOSPITAL_COMMUNITY)
Admission: RE | Admit: 2023-11-27 | Discharge: 2023-11-27 | Disposition: A | Discharge status: Home / Self Care | Payer: BC Managed Care – PPO | Source: Ambulatory Visit | Attending: Orthopedic Surgery | Admitting: Orthopedic Surgery

## 2023-11-27 ENCOUNTER — Other Ambulatory Visit: Payer: Self-pay

## 2023-11-27 VITALS — BP 159/74 | HR 51 | Temp 97.8°F | Resp 17 | Ht 66.0 in | Wt 175.3 lb

## 2023-11-27 DIAGNOSIS — Z01810 Encounter for preprocedural cardiovascular examination: Secondary | ICD-10-CM | POA: Diagnosis not present

## 2023-11-27 DIAGNOSIS — I10 Essential (primary) hypertension: Secondary | ICD-10-CM | POA: Diagnosis not present

## 2023-11-27 DIAGNOSIS — R001 Bradycardia, unspecified: Secondary | ICD-10-CM | POA: Diagnosis not present

## 2023-11-27 DIAGNOSIS — I700 Atherosclerosis of aorta: Secondary | ICD-10-CM | POA: Diagnosis not present

## 2023-11-27 DIAGNOSIS — Z01818 Encounter for other preprocedural examination: Secondary | ICD-10-CM | POA: Insufficient documentation

## 2023-11-27 DIAGNOSIS — Q2381 Bicuspid aortic valve: Secondary | ICD-10-CM | POA: Diagnosis not present

## 2023-11-27 DIAGNOSIS — E7800 Pure hypercholesterolemia, unspecified: Secondary | ICD-10-CM | POA: Diagnosis not present

## 2023-11-27 DIAGNOSIS — I77810 Thoracic aortic ectasia: Secondary | ICD-10-CM | POA: Diagnosis not present

## 2023-11-27 DIAGNOSIS — I2510 Atherosclerotic heart disease of native coronary artery without angina pectoris: Secondary | ICD-10-CM | POA: Diagnosis not present

## 2023-11-27 DIAGNOSIS — I517 Cardiomegaly: Secondary | ICD-10-CM | POA: Diagnosis not present

## 2023-11-27 DIAGNOSIS — I7121 Aneurysm of the ascending aorta, without rupture: Secondary | ICD-10-CM | POA: Diagnosis not present

## 2023-11-27 LAB — BASIC METABOLIC PANEL
Anion gap: 7 (ref 5–15)
BUN: 17 mg/dL (ref 8–23)
CO2: 29 mmol/L (ref 22–32)
Calcium: 9.3 mg/dL (ref 8.9–10.3)
Chloride: 104 mmol/L (ref 98–111)
Creatinine, Ser: 1.46 mg/dL — ABNORMAL HIGH (ref 0.61–1.24)
GFR, Estimated: 54 mL/min — ABNORMAL LOW (ref >60)
Glucose, Bld: 96 mg/dL (ref 70–99)
Potassium: 4.4 mmol/L (ref 3.5–5.1)
Sodium: 140 mmol/L (ref 135–145)

## 2023-11-27 LAB — CBC
HCT: 38.4 % — ABNORMAL LOW (ref 39.0–52.0)
Hemoglobin: 12.4 g/dL — ABNORMAL LOW (ref 13.0–17.0)
MCH: 29 pg (ref 26.0–34.0)
MCHC: 32.3 g/dL (ref 30.0–36.0)
MCV: 89.9 fL (ref 80.0–100.0)
Platelets: 225 10*3/uL (ref 150–400)
RBC: 4.27 MIL/uL (ref 4.22–5.81)
RDW: 12.8 % (ref 11.5–15.5)
WBC: 6.5 10*3/uL (ref 4.0–10.5)
nRBC: 0 % (ref 0.0–0.2)

## 2023-11-27 LAB — TYPE AND SCREEN
ABO/RH(D): O POS
Antibody Screen: NEGATIVE

## 2023-11-27 LAB — SURGICAL PCR SCREEN
MRSA, PCR: NEGATIVE
Staphylococcus aureus: POSITIVE — AB

## 2023-11-27 MED ORDER — IOHEXOL 350 MG/ML SOLN
75.0000 mL | Freq: Once | INTRAVENOUS | Status: AC | PRN
  Administered 2023-11-27: 75 mL via INTRAVENOUS

## 2023-11-27 NOTE — Progress Notes (Signed)
 PCP - Cleatis Polka., MD  Cardiologist - Tonny Bollman, MD   PPM/ICD - denies Device Orders - n/a Rep Notified - n/a  Chest x-ray -  EKG - 11-20-23 Stress Test -  ECHO - 11-20-23 Cardiac Cath - 08-12-17  Sleep Study - denies CPAP - n/a  DM -denies  Blood Thinner Instructions: denies Aspirin Instructions: Last dose 11-22-23  ERAS Protcol - clear liquids until 7:30 a.m PRE-SURGERY Ensure or G2-   COVID TEST- n/a   Anesthesia review: yes  Patient denies shortness of breath, fever, cough and chest pain at PAT appointment   All instructions explained to the patient, with a verbal understanding of the material. Patient agrees to go over the instructions while at home for a better understanding. Patient also instructed to self quarantine after being tested for COVID-19. The opportunity to ask questions was provided.

## 2023-12-03 ENCOUNTER — Inpatient Hospital Stay (HOSPITAL_COMMUNITY)
Admission: RE | Admit: 2023-12-03 | Discharge: 2023-12-06 | DRG: 428 | Disposition: A | Discharge status: Home / Self Care | Payer: BC Managed Care – PPO | Attending: Orthopedic Surgery | Admitting: Orthopedic Surgery

## 2023-12-03 ENCOUNTER — Inpatient Hospital Stay (HOSPITAL_COMMUNITY): Payer: BC Managed Care – PPO

## 2023-12-03 ENCOUNTER — Inpatient Hospital Stay (HOSPITAL_COMMUNITY): Payer: Self-pay

## 2023-12-03 ENCOUNTER — Other Ambulatory Visit: Payer: Self-pay

## 2023-12-03 ENCOUNTER — Encounter (HOSPITAL_COMMUNITY)
Admission: RE | Disposition: A | Discharge status: Home / Self Care | Payer: Self-pay | Source: Home / Self Care | Attending: Orthopedic Surgery

## 2023-12-03 DIAGNOSIS — M51362 Other intervertebral disc degeneration, lumbar region with discogenic back pain and lower extremity pain: Secondary | ICD-10-CM | POA: Diagnosis not present

## 2023-12-03 DIAGNOSIS — E785 Hyperlipidemia, unspecified: Secondary | ICD-10-CM | POA: Diagnosis not present

## 2023-12-03 DIAGNOSIS — Q2381 Bicuspid aortic valve: Secondary | ICD-10-CM

## 2023-12-03 DIAGNOSIS — Z79890 Hormone replacement therapy (postmenopausal): Secondary | ICD-10-CM | POA: Diagnosis not present

## 2023-12-03 DIAGNOSIS — N1831 Chronic kidney disease, stage 3a: Secondary | ICD-10-CM | POA: Diagnosis not present

## 2023-12-03 DIAGNOSIS — I10 Essential (primary) hypertension: Secondary | ICD-10-CM | POA: Diagnosis not present

## 2023-12-03 DIAGNOSIS — M48061 Spinal stenosis, lumbar region without neurogenic claudication: Secondary | ICD-10-CM | POA: Diagnosis present

## 2023-12-03 DIAGNOSIS — K219 Gastro-esophageal reflux disease without esophagitis: Secondary | ICD-10-CM | POA: Diagnosis not present

## 2023-12-03 DIAGNOSIS — Z981 Arthrodesis status: Principal | ICD-10-CM

## 2023-12-03 DIAGNOSIS — K649 Unspecified hemorrhoids: Secondary | ICD-10-CM | POA: Diagnosis present

## 2023-12-03 DIAGNOSIS — I2510 Atherosclerotic heart disease of native coronary artery without angina pectoris: Secondary | ICD-10-CM | POA: Diagnosis not present

## 2023-12-03 DIAGNOSIS — I7121 Aneurysm of the ascending aorta, without rupture: Secondary | ICD-10-CM | POA: Diagnosis present

## 2023-12-03 DIAGNOSIS — M5116 Intervertebral disc disorders with radiculopathy, lumbar region: Principal | ICD-10-CM | POA: Diagnosis present

## 2023-12-03 DIAGNOSIS — I350 Nonrheumatic aortic (valve) stenosis: Secondary | ICD-10-CM | POA: Diagnosis present

## 2023-12-03 DIAGNOSIS — E039 Hypothyroidism, unspecified: Secondary | ICD-10-CM | POA: Diagnosis present

## 2023-12-03 DIAGNOSIS — M47816 Spondylosis without myelopathy or radiculopathy, lumbar region: Secondary | ICD-10-CM | POA: Diagnosis not present

## 2023-12-03 DIAGNOSIS — M2578 Osteophyte, vertebrae: Secondary | ICD-10-CM | POA: Diagnosis present

## 2023-12-03 DIAGNOSIS — M4807 Spinal stenosis, lumbosacral region: Secondary | ICD-10-CM | POA: Diagnosis not present

## 2023-12-03 DIAGNOSIS — I129 Hypertensive chronic kidney disease with stage 1 through stage 4 chronic kidney disease, or unspecified chronic kidney disease: Secondary | ICD-10-CM | POA: Diagnosis not present

## 2023-12-03 DIAGNOSIS — Z953 Presence of xenogenic heart valve: Secondary | ICD-10-CM | POA: Diagnosis not present

## 2023-12-03 DIAGNOSIS — R011 Cardiac murmur, unspecified: Secondary | ICD-10-CM | POA: Diagnosis present

## 2023-12-03 DIAGNOSIS — M5117 Intervertebral disc disorders with radiculopathy, lumbosacral region: Secondary | ICD-10-CM | POA: Diagnosis not present

## 2023-12-03 DIAGNOSIS — Z8719 Personal history of other diseases of the digestive system: Secondary | ICD-10-CM

## 2023-12-03 DIAGNOSIS — Z951 Presence of aortocoronary bypass graft: Secondary | ICD-10-CM

## 2023-12-03 DIAGNOSIS — Z91018 Allergy to other foods: Secondary | ICD-10-CM | POA: Diagnosis not present

## 2023-12-03 DIAGNOSIS — Z7982 Long term (current) use of aspirin: Secondary | ICD-10-CM | POA: Diagnosis not present

## 2023-12-03 DIAGNOSIS — M1990 Unspecified osteoarthritis, unspecified site: Secondary | ICD-10-CM | POA: Diagnosis present

## 2023-12-03 DIAGNOSIS — M5417 Radiculopathy, lumbosacral region: Secondary | ICD-10-CM | POA: Diagnosis not present

## 2023-12-03 DIAGNOSIS — Z79899 Other long term (current) drug therapy: Secondary | ICD-10-CM

## 2023-12-03 DIAGNOSIS — K900 Celiac disease: Secondary | ICD-10-CM | POA: Diagnosis not present

## 2023-12-03 HISTORY — PX: TRANSFORAMINAL LUMBAR INTERBODY FUSION (TLIF) WITH PEDICLE SCREW FIXATION 2 LEVEL: SHX6142

## 2023-12-03 SURGERY — TRANSFORAMINAL LUMBAR INTERBODY FUSION (TLIF) WITH PEDICLE SCREW FIXATION 2 LEVEL
Anesthesia: General | Site: Spine Lumbar

## 2023-12-03 MED ORDER — THROMBIN 20000 UNITS EX SOLR
CUTANEOUS | Status: AC
  Filled 2023-12-03: qty 20000

## 2023-12-03 MED ORDER — CHLORHEXIDINE GLUCONATE 4 % EX SOLN
1.0000 | CUTANEOUS | 1 refills | Status: DC
Start: 2023-12-03 — End: 2023-12-03

## 2023-12-03 MED ORDER — CEFAZOLIN SODIUM-DEXTROSE 1-4 GM/50ML-% IV SOLN
1.0000 g | Freq: Three times a day (TID) | INTRAVENOUS | Status: AC
  Administered 2023-12-03 – 2023-12-04 (×2): 1 g via INTRAVENOUS
  Filled 2023-12-03 (×2): qty 50

## 2023-12-03 MED ORDER — 0.9 % SODIUM CHLORIDE (POUR BTL) OPTIME
TOPICAL | Status: DC | PRN
  Administered 2023-12-03 (×2): 1000 mL

## 2023-12-03 MED ORDER — EZETIMIBE 10 MG PO TABS
10.0000 mg | ORAL_TABLET | Freq: Every day | ORAL | Status: DC
  Administered 2023-12-03 – 2023-12-05 (×3): 10 mg via ORAL
  Filled 2023-12-03 (×4): qty 1

## 2023-12-03 MED ORDER — FENTANYL CITRATE (PF) 250 MCG/5ML IJ SOLN
INTRAMUSCULAR | Status: AC
  Filled 2023-12-03: qty 5

## 2023-12-03 MED ORDER — MAGNESIUM CITRATE PO SOLN
1.0000 | Freq: Once | ORAL | Status: AC | PRN
  Administered 2023-12-05: 1 via ORAL
  Filled 2023-12-03: qty 296

## 2023-12-03 MED ORDER — ONDANSETRON HCL 4 MG PO TABS: 4.0000 mg | ORAL_TABLET | Freq: Four times a day (QID) | ORAL | Status: DC | PRN

## 2023-12-03 MED ORDER — DEXAMETHASONE SODIUM PHOSPHATE 10 MG/ML IJ SOLN
INTRAMUSCULAR | Status: DC | PRN
  Administered 2023-12-03 (×2): 10 mg via INTRAVENOUS

## 2023-12-03 MED ORDER — OXYCODONE HCL 5 MG/5ML PO SOLN: 5.0000 mg | Freq: Once | ORAL | Status: DC | PRN

## 2023-12-03 MED ORDER — MIDAZOLAM HCL 5 MG/5ML IJ SOLN
INTRAMUSCULAR | Status: DC | PRN
  Administered 2023-12-03: 2 mg via INTRAVENOUS

## 2023-12-03 MED ORDER — MIDAZOLAM HCL 2 MG/2ML IJ SOLN: 0.5000 mg | Freq: Once | INTRAMUSCULAR | Status: DC | PRN

## 2023-12-03 MED ORDER — GLYCOPYRROLATE PF 0.2 MG/ML IJ SOSY
PREFILLED_SYRINGE | INTRAMUSCULAR | Status: DC | PRN
  Administered 2023-12-03: .2 mg via INTRAVENOUS

## 2023-12-03 MED ORDER — METHOCARBAMOL 500 MG PO TABS: 500.0000 mg | ORAL_TABLET | Freq: Three times a day (TID) | ORAL | 0 refills | Status: AC | PRN

## 2023-12-03 MED ORDER — NALOXONE HCL 0.4 MG/ML IJ SOLN
INTRAMUSCULAR | Status: DC | PRN
  Administered 2023-12-03 (×4): .1 mg via INTRAVENOUS

## 2023-12-03 MED ORDER — ONDANSETRON HCL 4 MG/2ML IJ SOLN
INTRAMUSCULAR | Status: AC
  Filled 2023-12-03: qty 2

## 2023-12-03 MED ORDER — OXYCODONE-ACETAMINOPHEN 10-325 MG PO TABS: 1.0000 | ORAL_TABLET | Freq: Four times a day (QID) | ORAL | 0 refills | Status: AC | PRN

## 2023-12-03 MED ORDER — LISINOPRIL 20 MG PO TABS
20.0000 mg | ORAL_TABLET | Freq: Every day | ORAL | Status: DC
  Administered 2023-12-03 – 2023-12-05 (×3): 20 mg via ORAL
  Filled 2023-12-03 (×4): qty 1

## 2023-12-03 MED ORDER — OXYCODONE HCL 5 MG PO TABS
5.0000 mg | ORAL_TABLET | ORAL | Status: DC | PRN
  Administered 2023-12-04 – 2023-12-06 (×2): 5 mg via ORAL

## 2023-12-03 MED ORDER — PHENYLEPHRINE HCL-NACL 20-0.9 MG/250ML-% IV SOLN
INTRAVENOUS | Status: DC | PRN
  Administered 2023-12-03: 50 ug/min via INTRAVENOUS

## 2023-12-03 MED ORDER — ROCURONIUM BROMIDE 10 MG/ML (PF) SYRINGE
PREFILLED_SYRINGE | INTRAVENOUS | Status: AC
  Filled 2023-12-03: qty 10

## 2023-12-03 MED ORDER — ROSUVASTATIN CALCIUM 20 MG PO TABS
40.0000 mg | ORAL_TABLET | Freq: Every day | ORAL | Status: DC
  Administered 2023-12-03 – 2023-12-05 (×3): 40 mg via ORAL
  Filled 2023-12-03 (×4): qty 2

## 2023-12-03 MED ORDER — NALOXONE HCL 0.4 MG/ML IJ SOLN: INTRAMUSCULAR | Status: DC | PRN

## 2023-12-03 MED ORDER — FENTANYL CITRATE (PF) 250 MCG/5ML IJ SOLN
INTRAMUSCULAR | Status: DC | PRN
  Administered 2023-12-03 (×3): 50 ug via INTRAVENOUS
  Administered 2023-12-03: 100 ug via INTRAVENOUS
  Administered 2023-12-03 (×4): 50 ug via INTRAVENOUS

## 2023-12-03 MED ORDER — HYDROMORPHONE HCL 1 MG/ML IJ SOLN: 0.2500 mg | INTRAMUSCULAR | Status: DC | PRN

## 2023-12-03 MED ORDER — ONDANSETRON HCL 4 MG/2ML IJ SOLN: 4.0000 mg | Freq: Four times a day (QID) | INTRAMUSCULAR | Status: DC | PRN

## 2023-12-03 MED ORDER — OXYCODONE HCL 5 MG PO TABS
10.0000 mg | ORAL_TABLET | ORAL | Status: DC | PRN
  Administered 2023-12-04 – 2023-12-05 (×8): 10 mg via ORAL
  Filled 2023-12-03 (×11): qty 2

## 2023-12-03 MED ORDER — METOPROLOL TARTRATE 12.5 MG HALF TABLET
12.5000 mg | ORAL_TABLET | Freq: Two times a day (BID) | ORAL | Status: DC
  Administered 2023-12-03 – 2023-12-06 (×6): 12.5 mg via ORAL
  Filled 2023-12-03 (×6): qty 1

## 2023-12-03 MED ORDER — SUGAMMADEX SODIUM 200 MG/2ML IV SOLN
INTRAVENOUS | Status: AC
  Filled 2023-12-03: qty 4

## 2023-12-03 MED ORDER — SODIUM CHLORIDE 0.9% FLUSH
3.0000 mL | Freq: Two times a day (BID) | INTRAVENOUS | Status: DC
  Administered 2023-12-03 – 2023-12-06 (×5): 3 mL via INTRAVENOUS

## 2023-12-03 MED ORDER — SUCCINYLCHOLINE CHLORIDE 200 MG/10ML IV SOSY
PREFILLED_SYRINGE | INTRAVENOUS | Status: DC | PRN
Start: 2023-12-03 — End: 2023-12-03
  Administered 2023-12-03: 160 mg via INTRAVENOUS

## 2023-12-03 MED ORDER — THROMBIN 20000 UNITS EX SOLR: CUTANEOUS | Status: DC | PRN

## 2023-12-03 MED ORDER — PHENYLEPHRINE 80 MCG/ML (10ML) SYRINGE FOR IV PUSH (FOR BLOOD PRESSURE SUPPORT)
PREFILLED_SYRINGE | INTRAVENOUS | Status: AC
  Filled 2023-12-03: qty 10

## 2023-12-03 MED ORDER — MUPIROCIN 2 % EX OINT
1.0000 | TOPICAL_OINTMENT | Freq: Two times a day (BID) | CUTANEOUS | 0 refills | Status: AC
Start: 2023-12-03 — End: 2024-01-02

## 2023-12-03 MED ORDER — CHLORHEXIDINE GLUCONATE 0.12 % MT SOLN
15.0000 mL | Freq: Once | OROMUCOSAL | Status: AC
  Administered 2023-12-03: 15 mL via OROMUCOSAL
  Filled 2023-12-03: qty 15

## 2023-12-03 MED ORDER — ONDANSETRON HCL 4 MG PO TABS: 4.0000 mg | ORAL_TABLET | Freq: Three times a day (TID) | ORAL | 0 refills | Status: DC | PRN

## 2023-12-03 MED ORDER — LACTATED RINGERS IV SOLN: INTRAVENOUS | Status: DC

## 2023-12-03 MED ORDER — ORAL CARE MOUTH RINSE: 15.0000 mL | Freq: Once | OROMUCOSAL | Status: AC

## 2023-12-03 MED ORDER — SURGIFLO WITH THROMBIN (HEMOSTATIC MATRIX KIT) OPTIME
TOPICAL | Status: DC | PRN
  Administered 2023-12-03 (×3): 1 via TOPICAL

## 2023-12-03 MED ORDER — ACETAMINOPHEN 650 MG RE SUPP: 650.0000 mg | RECTAL | Status: DC | PRN

## 2023-12-03 MED ORDER — ONDANSETRON HCL 4 MG/2ML IJ SOLN
INTRAMUSCULAR | Status: DC | PRN
  Administered 2023-12-03 (×2): 4 mg via INTRAVENOUS

## 2023-12-03 MED ORDER — TRANEXAMIC ACID-NACL 1000-0.7 MG/100ML-% IV SOLN
1000.0000 mg | INTRAVENOUS | Status: AC
  Administered 2023-12-03: 1000 mg via INTRAVENOUS
  Filled 2023-12-03: qty 100

## 2023-12-03 MED ORDER — EPHEDRINE SULFATE (PRESSORS) 50 MG/ML IJ SOLN
INTRAMUSCULAR | Status: DC | PRN
  Administered 2023-12-03 (×2): 10 mg via INTRAVENOUS

## 2023-12-03 MED ORDER — HYDROMORPHONE HCL 1 MG/ML IJ SOLN
1.0000 mg | INTRAMUSCULAR | Status: AC | PRN
  Administered 2023-12-03 – 2023-12-04 (×4): 1 mg via INTRAVENOUS
  Filled 2023-12-03 (×4): qty 1

## 2023-12-03 MED ORDER — EPHEDRINE 5 MG/ML INJ
INTRAVENOUS | Status: AC
  Filled 2023-12-03: qty 5

## 2023-12-03 MED ORDER — LACTATED RINGERS IV SOLN: INTRAVENOUS | Status: DC | PRN

## 2023-12-03 MED ORDER — GABAPENTIN 300 MG PO CAPS
300.0000 mg | ORAL_CAPSULE | Freq: Three times a day (TID) | ORAL | Status: DC
  Administered 2023-12-03 – 2023-12-06 (×8): 300 mg via ORAL
  Filled 2023-12-03 (×8): qty 1

## 2023-12-03 MED ORDER — ACETAMINOPHEN 500 MG PO TABS
1000.0000 mg | ORAL_TABLET | Freq: Once | ORAL | Status: AC
  Administered 2023-12-03: 1000 mg via ORAL
  Filled 2023-12-03: qty 2

## 2023-12-03 MED ORDER — PHENOL 1.4 % MT LIQD: 1.0000 | OROMUCOSAL | Status: DC | PRN

## 2023-12-03 MED ORDER — PROPOFOL 10 MG/ML IV BOLUS
INTRAVENOUS | Status: DC | PRN
  Administered 2023-12-03: 150 mg via INTRAVENOUS
  Administered 2023-12-03: 50 mg via INTRAVENOUS

## 2023-12-03 MED ORDER — LIDOCAINE 2% (20 MG/ML) 5 ML SYRINGE
INTRAMUSCULAR | Status: AC
  Filled 2023-12-03: qty 5

## 2023-12-03 MED ORDER — MENTHOL 3 MG MT LOZG: 1.0000 | LOZENGE | OROMUCOSAL | Status: DC | PRN

## 2023-12-03 MED ORDER — BUPIVACAINE-EPINEPHRINE (PF) 0.25% -1:200000 IJ SOLN
INTRAMUSCULAR | Status: AC
  Filled 2023-12-03: qty 30

## 2023-12-03 MED ORDER — MIDAZOLAM HCL 2 MG/2ML IJ SOLN
INTRAMUSCULAR | Status: AC
  Filled 2023-12-03: qty 2

## 2023-12-03 MED ORDER — CEFAZOLIN SODIUM-DEXTROSE 2-4 GM/100ML-% IV SOLN
2.0000 g | INTRAVENOUS | Status: AC
  Administered 2023-12-03 (×2): 2 g via INTRAVENOUS
  Filled 2023-12-03: qty 100

## 2023-12-03 MED ORDER — METHOCARBAMOL 1000 MG/10ML IJ SOLN: 500.0000 mg | Freq: Four times a day (QID) | INTRAMUSCULAR | Status: DC | PRN

## 2023-12-03 MED ORDER — SODIUM CHLORIDE 0.9 % IV SOLN
250.0000 mL | INTRAVENOUS | Status: AC
Start: 2023-12-03 — End: 2023-12-04
  Administered 2023-12-03: 250 mL via INTRAVENOUS

## 2023-12-03 MED ORDER — BUPIVACAINE-EPINEPHRINE 0.25% -1:200000 IJ SOLN
INTRAMUSCULAR | Status: DC | PRN
  Administered 2023-12-03: 10 mL

## 2023-12-03 MED ORDER — METHOCARBAMOL 500 MG PO TABS
500.0000 mg | ORAL_TABLET | Freq: Four times a day (QID) | ORAL | Status: DC | PRN
  Administered 2023-12-04 – 2023-12-05 (×5): 500 mg via ORAL
  Filled 2023-12-03 (×7): qty 1

## 2023-12-03 MED ORDER — POLYETHYLENE GLYCOL 3350 17 G PO PACK
17.0000 g | PACK | Freq: Every day | ORAL | Status: DC | PRN
  Administered 2023-12-05: 17 g via ORAL
  Filled 2023-12-03: qty 1

## 2023-12-03 MED ORDER — LEVOTHYROXINE SODIUM 75 MCG PO TABS
75.0000 ug | ORAL_TABLET | Freq: Every day | ORAL | Status: DC
  Administered 2023-12-04 – 2023-12-06 (×3): 75 ug via ORAL
  Filled 2023-12-03 (×3): qty 1

## 2023-12-03 MED ORDER — PROPOFOL 1000 MG/100ML IV EMUL
INTRAVENOUS | Status: AC
  Filled 2023-12-03: qty 100

## 2023-12-03 MED ORDER — DEXAMETHASONE SODIUM PHOSPHATE 10 MG/ML IJ SOLN
INTRAMUSCULAR | Status: AC
  Filled 2023-12-03: qty 1

## 2023-12-03 MED ORDER — PROPOFOL 500 MG/50ML IV EMUL
INTRAVENOUS | Status: DC | PRN
  Administered 2023-12-03: 125 ug/kg/min via INTRAVENOUS

## 2023-12-03 MED ORDER — PHENYLEPHRINE 80 MCG/ML (10ML) SYRINGE FOR IV PUSH (FOR BLOOD PRESSURE SUPPORT)
PREFILLED_SYRINGE | INTRAVENOUS | Status: DC | PRN
  Administered 2023-12-03: 80 ug via INTRAVENOUS
  Administered 2023-12-03: 160 ug via INTRAVENOUS
  Administered 2023-12-03: 80 ug via INTRAVENOUS
  Administered 2023-12-03: 160 ug via INTRAVENOUS

## 2023-12-03 MED ORDER — SODIUM CHLORIDE 0.9% FLUSH
3.0000 mL | INTRAVENOUS | Status: DC | PRN
  Administered 2023-12-04: 3 mL via INTRAVENOUS

## 2023-12-03 MED ORDER — OXYCODONE HCL 5 MG PO TABS: 5.0000 mg | ORAL_TABLET | Freq: Once | ORAL | Status: DC | PRN

## 2023-12-03 MED ORDER — LIDOCAINE 2% (20 MG/ML) 5 ML SYRINGE
INTRAMUSCULAR | Status: DC | PRN
Start: 2023-12-03 — End: 2023-12-03
  Administered 2023-12-03: 40 mg via INTRAVENOUS

## 2023-12-03 MED ORDER — ACETAMINOPHEN 325 MG PO TABS
650.0000 mg | ORAL_TABLET | ORAL | Status: DC | PRN
  Administered 2023-12-04 – 2023-12-06 (×4): 650 mg via ORAL
  Filled 2023-12-03 (×4): qty 2

## 2023-12-03 SURGICAL SUPPLY — 70 items
BAG COUNTER SPONGE SURGICOUNT (BAG) ×1 IMPLANT
BLADE BONE MILL MEDIUM (MISCELLANEOUS) IMPLANT
BLADE CLIPPER SURG (BLADE) IMPLANT
BUR EGG ELITE 4.0 (BURR) ×1 IMPLANT
BUR EGG ELITE 5.0 (BURR) IMPLANT
BUR MATCHSTICK NEURO 3.0 LAGG (BURR) ×1 IMPLANT
BUR NEURO DRILL SOFT 3.0X3.8M (BURR) IMPLANT
CAGE MOD EX PL 7X9X24 17D (Cage) IMPLANT
CANISTER SUCT 3000ML PPV (MISCELLANEOUS) ×1 IMPLANT
CAP RELINE MOD TULIP RMM (Cap) IMPLANT
CLSR STERI-STRIP ANTIMIC 1/2X4 (GAUZE/BANDAGES/DRESSINGS) ×1 IMPLANT
COVER SURGICAL LIGHT HANDLE (MISCELLANEOUS) ×1 IMPLANT
DRAIN CHANNEL 15F RND FF W/TCR (WOUND CARE) IMPLANT
DRAPE C-ARM 42X72 X-RAY (DRAPES) ×1 IMPLANT
DRAPE C-ARMOR (DRAPES) ×1 IMPLANT
DRAPE POUCH INSTRU U-SHP 10X18 (DRAPES) IMPLANT
DRAPE SURG 17X23 STRL (DRAPES) ×1 IMPLANT
DRAPE U-SHAPE 47X51 STRL (DRAPES) ×1 IMPLANT
DRSG OPSITE POSTOP 4X6 (GAUZE/BANDAGES/DRESSINGS) IMPLANT
DRSG OPSITE POSTOP 4X8 (GAUZE/BANDAGES/DRESSINGS) ×1 IMPLANT
DURAPREP 26ML APPLICATOR (WOUND CARE) ×1 IMPLANT
ELECT BLADE 6.5 EXT (BLADE) ×1 IMPLANT
ELECT NVM5 SURFACE MEP/EMG (ELECTRODE) IMPLANT
ELECT PENCIL ROCKER SW 15FT (MISCELLANEOUS) ×1 IMPLANT
ELECT REM PT RETURN 9FT ADLT (ELECTROSURGICAL) ×1 IMPLANT
ELECTRODE REM PT RTRN 9FT ADLT (ELECTROSURGICAL) ×1 IMPLANT
GLOVE BIOGEL PI IND STRL 8.5 (GLOVE) ×1 IMPLANT
GLOVE SS BIOGEL STRL SZ 8.5 (GLOVE) ×1 IMPLANT
GOWN STRL REUS W/TWL 2XL LVL3 (GOWN DISPOSABLE) ×1 IMPLANT
KIT BASIN OR (CUSTOM PROCEDURE TRAY) ×1 IMPLANT
KIT POSITION SURG JACKSON T1 (MISCELLANEOUS) IMPLANT
KIT TURNOVER KIT B (KITS) ×1 IMPLANT
MODULE EMG NDL SSEP NVM5 (NEUROSURGERY SUPPLIES) IMPLANT
MODULE EMG NEEDLE SSEP NVM5 (NEUROSURGERY SUPPLIES) ×1 IMPLANT
NDL 22X1.5 STRL (OR ONLY) (MISCELLANEOUS) IMPLANT
NDL SPNL 18GX3.5 QUINCKE PK (NEEDLE) ×1 IMPLANT
NEEDLE 22X1.5 STRL (OR ONLY) (MISCELLANEOUS) IMPLANT
NEEDLE SPNL 18GX3.5 QUINCKE PK (NEEDLE) ×2 IMPLANT
NS IRRIG 1000ML POUR BTL (IV SOLUTION) ×1 IMPLANT
PACK LAMINECTOMY ORTHO (CUSTOM PROCEDURE TRAY) ×1 IMPLANT
PACK UNIVERSAL I (CUSTOM PROCEDURE TRAY) ×1 IMPLANT
PAD ARMBOARD 7.5X6 YLW CONV (MISCELLANEOUS) ×2 IMPLANT
PATTIES SURGICAL .5 X.5 (GAUZE/BANDAGES/DRESSINGS) IMPLANT
PATTIES SURGICAL .5 X1 (DISPOSABLE) ×1 IMPLANT
POSITIONER HEAD PRONE TRACH (MISCELLANEOUS) ×1 IMPLANT
PROBE BALL TIP NVM5 SNG USE (NEUROSURGERY SUPPLIES) IMPLANT
PUTTY DBM INSTAFILL CART 5CC (Putty) IMPLANT
ROD RELINE-O COCR 5.0X55MM (Rod) IMPLANT
SCREW LOCK RSS 4.5/5.0MM (Screw) IMPLANT
SCREW SHANK RELINE 6.5X40MM (Screw) IMPLANT
SHANK RELINE O MOD 5.5X45MM (Screw) IMPLANT
SHANK RELINE O MOD 6.5X45 (Screw) IMPLANT
SPONGE SURGIFOAM ABS GEL 100 (HEMOSTASIS) ×1 IMPLANT
SPONGE T-LAP 4X18 ~~LOC~~+RFID (SPONGE) ×3 IMPLANT
SURGIFLO W/THROMBIN 8M KIT (HEMOSTASIS) ×1 IMPLANT
SUT BONE WAX W31G (SUTURE) ×1 IMPLANT
SUT MNCRL AB 3-0 PS2 27 (SUTURE) ×2 IMPLANT
SUT MNCRL+ AB 3-0 CT1 36 (SUTURE) ×1 IMPLANT
SUT STRATAFIX 1PDS 45CM VIOLET (SUTURE) IMPLANT
SUT VIC AB 0 CT1 27XBRD ANTBC (SUTURE) ×1 IMPLANT
SUT VIC AB 1 CT1 18XCR BRD 8 (SUTURE) ×1 IMPLANT
SUT VIC AB 2-0 CT1 18 (SUTURE) ×1 IMPLANT
SYR BULB IRRIG 60ML STRL (SYRINGE) ×1 IMPLANT
SYR CONTROL 10ML LL (SYRINGE) ×1 IMPLANT
TIP CONICAL INSTAFILL (ORTHOPEDIC DISPOSABLE SUPPLIES) IMPLANT
TOWEL GREEN STERILE (TOWEL DISPOSABLE) ×1 IMPLANT
TOWEL GREEN STERILE FF (TOWEL DISPOSABLE) ×1 IMPLANT
TRAY FOLEY MTR SLVR 16FR STAT (SET/KITS/TRAYS/PACK) ×1 IMPLANT
WATER STERILE IRR 1000ML POUR (IV SOLUTION) ×1 IMPLANT
YANKAUER SUCT BULB TIP NO VENT (SUCTIONS) ×1 IMPLANT

## 2023-12-03 NOTE — Anesthesia Preprocedure Evaluation (Signed)
 Anesthesia Evaluation  Patient identified by MRN, date of birth, ID band Patient awake    Reviewed: Allergy & Precautions, NPO status , Patient's Chart, lab work & pertinent test results, reviewed documented beta blocker date and time   History of Anesthesia Complications Negative for: history of anesthetic complications  Airway Mallampati: II  TM Distance: >3 FB Neck ROM: Full    Dental  (+) Dental Advisory Given   Pulmonary neg pulmonary ROS   breath sounds clear to auscultation       Cardiovascular hypertension, Pt. on medications and Pt. on home beta blockers (-) angina + CAD and + CABG  + Valvular Problems/Murmurs (s/p AVR for bicuspid valve)  Rhythm:Regular Rate:Normal  '22 ECHO: EF 60 to 65%.  1. The LV has normal function, no regional wall motion abnormalities. There is mild LVH. Grade I diastolic dysfunction (impaired relaxation).   2. RVF is normal. The right ventricular size is normal.   3. The MV is normal in structure. Trivial mitral valve regurgitation. No evidence of mitral stenosis.   4. Rapid deployment valve placed during CABG. The aortic valve has been  repaired/replaced. Aortic valve regurgitation is not visualized. No aortic stenosis is present. There is a 25 mm Edwards Intuity Elite Bovine valve present in the aortic position.  Procedure Date: 08/25/17. Aortic valve mean gradient measures 8.4 mmHg.  Aortic valve Vmax measures 1.90 m/s.   5. Aortic dilatation noted. There is mild dilatation of the ascending  aorta, measuring 43 mm.     Neuro/Psych    Depression    Back pain    GI/Hepatic Neg liver ROS,GERD  Medicated and Controlled,,  Endo/Other  Hypothyroidism    Renal/GU Renal InsufficiencyRenal disease     Musculoskeletal   Abdominal   Peds  Hematology Hb 12.4, plt 225k   Anesthesia Other Findings   Reproductive/Obstetrics                             Anesthesia  Physical Anesthesia Plan  ASA: 3  Anesthesia Plan: General   Post-op Pain Management: Tylenol PO (pre-op)*   Induction: Intravenous  PONV Risk Score and Plan: 2 and Ondansetron and Dexamethasone  Airway Management Planned: Oral ETT  Additional Equipment: None  Intra-op Plan:   Post-operative Plan: Extubation in OR  Informed Consent: I have reviewed the patients History and Physical, chart, labs and discussed the procedure including the risks, benefits and alternatives for the proposed anesthesia with the patient or authorized representative who has indicated his/her understanding and acceptance.     Dental advisory given  Plan Discussed with: CRNA and Surgeon  Anesthesia Plan Comments:        Anesthesia Quick Evaluation

## 2023-12-03 NOTE — Anesthesia Postprocedure Evaluation (Signed)
 Anesthesia Post Note  Patient: MACARIO SHEAR  Procedure(s) Performed: TRANSFORAMINAL LUMBAR INTERBODY FUSION (TLIF) L4-S1 (Spine Lumbar)     Patient location during evaluation: PACU Anesthesia Type: General Level of consciousness: awake Pain management: pain level controlled Vital Signs Assessment: post-procedure vital signs reviewed and stable Respiratory status: spontaneous breathing, nonlabored ventilation and respiratory function stable Cardiovascular status: blood pressure returned to baseline and stable Postop Assessment: no apparent nausea or vomiting Anesthetic complications: no   No notable events documented.  Last Vitals:  Vitals:   12/03/23 2052 12/03/23 2114  BP: (!) 149/71   Pulse: 97   Resp: 18   Temp:  36.9 C  SpO2: 97%     Last Pain:  Vitals:   12/03/23 2030  PainSc: Asleep                 Holden Draughon P Towana Stenglein

## 2023-12-03 NOTE — Op Note (Signed)
 OPERATIVE REPORT  DATE OF SURGERY: 12/03/2023  PATIENT NAME:  James Wilkins MRN: [id number] DOB: 08-Jul-1961  PCP: Cleatis Polka., MD  PRE-OPERATIVE DIAGNOSIS: Recurrent lumbar disc herniation with spinal stenosis and degenerative disc disease L4-5 and L5-S1.  POST-OPERATIVE DIAGNOSIS: Same  PROCEDURE:   TLIF L4-5 and L5-S1  SURGEON:  Venita Lick, MD  PHYSICIAN ASSISTANT: Luther Bradley, PA  ANESTHESIA:   General  EBL: 350 ml   Complications: None  Implants:  NuVasive expandable intervertebral cages.  7 x 9 x 24 mm.  L5-S1: 10 mm anterior 8.5 mm posterior.  L4-5: 12 mm anterior 9 mm posterior. NuVasive cortical pedicle screws.  Right side: L4: 6.5 x 45 mm.  L5: 5.5 x 45 mm .  S1: 6.5 x 40 mm.         Left side: L4: 6.5 x 45 mm.  S1: 6.5 x 40 mm length. 3.  Two 55 mm rods  Neuromonitoring: No adverse free running EMG or SSEP activity throughout the case.  All 5 pedicle screws were directly stimulated.  No adverse activity to suggest breach or nerve irritation. Right side: L4.  No activity greater than 40 mA.  L5: Positive activity at 34 mA.  S1: Positive activity at 31 mA. Left side: L4: Positive activity at 27 mA.  S1: Positive activity at 24 mA.  Graft: Autograft from decompression mixed with DBX.  BRIEF HISTORY: James Wilkins is a 63 y.o. male who has had 2 prior lumbar decompression procedures and unfortunately has recurrent back buttock and radicular left leg pain.  Imaging studies demonstrated recurrent disc herniation at L4-5 and significant stenosis and degenerative disc disease L5-S1.  Attempts at conservative management failed to alleviate his pain or improve his quality of life.  As result we elected to move forward with surgery.  All appropriate risks, benefits, alternatives were discussed and consent was obtained.  PROCEDURE DETAILS: Patient was brought into the operating room and was properly positioned on the operating room table.  After induction with general  anesthesia the patient was endotracheally intubated.  A timeout was taken to confirm all important data: including patient, procedure, and the level. Teds, SCD's were applied.   Foley was placed by the nurse, and the neuromonitoring rep placed all appropriate needles for intraoperative SSEP and EMG monitoring.  The patient was now turned prone onto the [facility] spine frame.  All bony prominences were well-padded and the back was then prepped and draped in a standard fashion.  Using fluoroscopy I marked out my incision site to span from the L4 pedicle to the S1 pedicle.  I infiltrated the incision site with quarter percent Marcaine with epinephrine.  I made my incision through the prior incision and sharply dissected down to the deep fascia.  I incised the deep fascia and starting at the L4 level identified the spinous process and lamina and then dissected the scar tissue and paraspinal muscles to expose this level.  I then exposed the L3-4 facet complex.  This was done bilaterally.  I then continued my dissection inferiorly.  I was careful not to proceed quickly and dives through scar tissue into the thecal sac.  Care was taken to remove the scar tissue and expose the remaining portion of the L4 and L5 spinous process and lamina.  The L4-5 and the L5-S1 facet capsules were also identified and exposed.  Once I had bilateral exposure I then removed the facet capsule that was remaining at L4-5 and L5-S1.  I was now ready to proceed with my pedicle screw instrumentation.  In the AP image identified the medial border of the pedicle.  I placed my high-speed bur on the inferior medial portion of the L4 pedicle and broached the cortex.  I then took a pedicle awl and using AP fluoroscopy advanced all from the inferior medial portion to the superior lateral portion of the pedicle.  As I was nearing the superior lateral portion of the pedicle I switched to the view to confirm that I was just beyond the posterior wall the  vertebral body.  Once I confirmed the appropriate trajectory and positioning I advanced into the vertebral body.  I removed the awl and then palpated the hole with a ball-tipped feeler to confirm that it was intact.  I then placed a 5.5 mm tap followed by a 6.5 mm tap to the appropriate depth.  I then palpated the hole with a ball-tipped feeler and confirmed that it was intact.  I then placed the 6.5 x 45 mm length screw.  It had excellent purchase.  This exact same technique was used to place the contralateral screw.  On the right side using the same technique and placed the L5 pedicle screw.  Because I would be doing the TLIF on the left side I did use the awl to create the path for the screw but I did not place the actual screw.  I was concerned that I would get in the way of my decompression.  The S1 screw was then placed.  This time I started more rectally medial and advanced towards the lateral wall.  I again palpated the hole and then used the tap and then elected to use the 6.5 millimeter screw.  Once all 5 pedicle screws were properly positioned I confirmed satisfactory overall positioning with AP and lateral fluoroscopy.  I then directly stimulated each of the screws and there is no adverse activity to suggest breach and nerve root irritation.  With the pedicle screw fixation complete I proceeded to the interbody fixation.  I started at L5-S1.  I remove the interspinous process ligament and performed a generous laminotomy on the left side at L5.  I ultimately was able to dissect through the scar tissue and develop a plane between the thecal sac and the ligamentum flavum.  I remove the ligamentum flavum and continued my dissection into the lateral recess.  The entire inferior L5 facet complex was removed using osteotome and double-action Leksell rongeur.  I then removed the pars and expose the exiting L5 nerve root.  The lateral recess was also decompressed.  Using Kerrison rongeurs I removed the medial  portion of the superior S1 facet to expose the S1 nerve root as well as the S1 pedicle.  The posterior aspect of the annulus now came into view.  I could freely now evaluate the L5 nerve root and pass my nerve hook along the L5 nerve root and out past the foramen in the extraforaminal zone.  I could also palpate the nerve going medially along the medial border of the pedicle.  A medial S1 foraminotomies was also performed for further decompression of the S1 nerve root.  An annulotomy was now performed with a 15 blade scalpel and I used pituitary rongeurs and curettes to remove all of the disc material.  Once the discectomy was completed I irrigated wound copiously normal saline and obtained the intervertebral cage.  The exiting L5 nerve root and thecal sac were protected and  I placed the cage and advanced it and it diagonal fashion into the disc space.  I confirmed by direct visualization at the posterior aspect of the cage was not contacting the nerve root and it was countersunk below the level of the disc space.  I then expanded the cage to the final height.  It had excellent overall purchase.  I then placed bone graft that had harvested from the decompression on the lateral side of the cage and backfilled the cage itself with DBX.  The L5-S1 level was now complete and so I repositioned the retractor I moved onto the L4-5 level.  I again resected the entire inferior L4 facet and performed a generous laminotomy of L4 with Kerrison rongeurs.  The mentum flavum was removed with Kerrison rongeurs and I resected the medial border of the superior L5 facet in order to complete the lateral recess decompression.  The L4 pars was now taken down to expose the L4 nerve root.  The posterior annulus was now visible.  I gently retracted the thecal sac and then performed an annulotomy at this level.  Using curettes and Kerrison rongeurs I removed all of the disc material.  The central disc herniation that was seen on the  preoperative MRI was also removed as was the left posterior lateral disc/osteophyte complex.  Once I had removed all of the disc material I was able to freely mobilize the L5 nerve root and palpate and mobilize the L5 nerve root.  I could easily pass my nerve hook under the thecal sac centrally and along the path of the L4 nerve root in the foramen and the path of the L5 nerve root in the lateral recess and out the L5 foramen.  With the decompression complete I move forward with the instrumentation.  The appropriate size cage was obtained and malleted into position.  Imaging confirmed satisfactory positioning of the cage.  I then expanded the anterior and posterior margins until an excellent overall fit in the disc space.  I removed the inserter and confirmed with direct visualization that the posterior aspect of the cage was countersunk and was not contacting the neural elements.  I then placed bone graft on the lateral aspect of the cage and backfilled the cage with DBX.  At this point the intervertebral cages and pedicle screws were properly placed and the decompression proved satisfactory.  Using a high-speed bur on the right side I decorticated the remaining portion of the facet complex at L4-5 and L5-S1 as well as the L4, L5 transverse processes and the sacral ala.  The remaining portion of bone graft was placed in the right posterior lateral gutter.  Polyaxial heads were then secured to each of the screws and the rod was obtained and placed.  Locking caps were then used to secure the rod.  All 5 locking caps were then tightened according manufactures standards with the torque counter torque device.  At this point the wound was copiously irrigated with normal saline and I obtained hemostasis using proper electrocautery and Floseal.  Thrombin-soaked Gelfoam patty was placed over each of the laminotomy site and I removed all of the retractors.  Final x-rays demonstrated satisfactory positioning of the  intervertebral cages and pedicle screw construct in both planes.  Deep fascia was closed with a running #1 strata fix followed by interrupted 2-0 Vicryl sutures and finally a 3-0 Monocryl for the skin.  Steri-Strips and dry dressings were applied and the patient was ultimately extubated transferred the PACU  without incident.  The end of the case all needle sponge counts were correct.  There were no adverse intraoperative events.  Venita Lick, MD 12/03/2023 5:22 PM '

## 2023-12-03 NOTE — H&P (Signed)
 History:  James Wilkins is a very pleasant 63 year old gentleman with significant back buttock and neuropathic left leg pain. Attempts at conservative management have failed to alleviate his symptoms and his overall quality of life is continued to deteriorate. He has had 3 prior lumbar decompression surgeries and unfortunately he still has significant pain. As result we are moving forward with a two-level TLIF L4-S1.  Past Medical History:  Diagnosis Date   Aortic stenosis    s/p AVR // Echocardiogram 10/22: EF 60-65, no RWMA, mild LVH, Gr 1 DD, normal RVSF, trivial MR, s/p AVR with normal function (no AS, no AI, mean 8.4 mmHg), mild dilation of ascending aorta (43 mm)   Arthritis    "joints" (08/27/2017)   Bicuspid aortic valve    Celiac disease    Coronary artery disease involving native coronary artery of native heart with angina pectoris (HCC)    Dilation of esophagus    tortuous esophagus   Dyspnea    Gastritis    non erosive   GERD (gastroesophageal reflux disease)    Heart murmur    "ever since I was little" (08/27/2017)   Hemorrhoids    Hiatal hernia    HLD (hyperlipidemia)    Hypertension    Hypothyroidism    IDA (iron deficiency anemia)    S/P aortic valve replacement with bioprosthetic valve 08/25/2017   25 mm Edwards Intuity Elite bovine pericardial stented rapid deployment valve   Thoracic ascending aortic aneurysm (HCC)    CT 2018: 4 cm // Echo 2022: 4.3 cm // CT 11/22: 3.7 cm    Allergies  Allergen Reactions   Gluten Meal     Other reaction(s): Celiac Disease   Wheat Diarrhea    Barley,rye    No current facility-administered medications on file prior to encounter.   Current Outpatient Medications on File Prior to Encounter  Medication Sig Dispense Refill   acetaminophen (TYLENOL) 500 MG tablet Take 1,000 mg by mouth every 6 (six) hours as needed for moderate pain (pain score 4-6).     aspirin EC 81 MG tablet Take 81 mg by mouth daily.     ezetimibe (ZETIA)  10 MG tablet TAKE 1 TABLET(10 MG) BY MOUTH DAILY 90 tablet 0   gabapentin (NEURONTIN) 300 MG capsule Take 300 mg by mouth 3 (three) times daily.     hydrocortisone (PROCTOZONE-HC) 2.5 % rectal cream Place 1 Application rectally daily as needed for hemorrhoids.     levothyroxine (SYNTHROID) 75 MCG tablet Take 75 mcg by mouth daily before breakfast.     Multiple Vitamins-Minerals (CENTRUM SILVER ULTRA MENS PO) Take 1 tablet by mouth daily.     Na Sulfate-K Sulfate-Mg Sulf 17.5-3.13-1.6 GM/177ML SOLN  (Patient not taking: Reported on 10/27/2023)     sildenafil (VIAGRA) 100 MG tablet 1 to 1/2 tablet as needed Orally Once a day for 30 days      Physical Exam: Shadrack is a pleasant individual, who appears younger than their stated age.  He is alert and orientated 3.  No shortness of breath, chest pain.  Abdomen is soft and non-tender, negative loss of bowel and bladder control, no rebound tenderness.  Negative: skin lesions abrasions contusions  Peripheral pulses: 2+ peripheral pulses bilaterally. LE compartments are: Soft and nontender.  Gait pattern: Altered gait pattern due to chronic back pain  Assistive devices: None  Neuro: Negative nerve root tension signs in the lower extremity. Negative Babinski test, no clonus, and 2+ deep tendon reflexes. 5/5 motor  strength, intermittent dysesthesias into the lower extremities bilaterally with the left side being worse than the right.  Musculoskeletal: Well-healed surgical scars from his 3 prior spine surgeries. Significant midline pain radiating into the paraspinal region.  Imaging: X-rays of the lumbar spine demonstrate multilevel degenerative disc disease L3-S1. Slight retrolisthesis at L4-5. Positive facet arthrosis at multiple levels in the lower lumbar spine.  Lumbar MRI: completed on 09/02/2023. Severe left foraminal stenosis at L5-S1. Stable left disc extrusion at L4-5 contributing to traversing L5 nerve root compression moderate to severe  right foraminal stenosis at L3-4. Advanced degenerative disc disease L3-S1 but most prominent at L4-5 and L5-S1. Prior right hemilaminectomy at L4-5 and L5-S1.  A/P: [name] is a very pleasant 63 year old gentleman who has had 3 prior decompressions L4-S1 for radicular leg pain who returns with progressive debilitating back buttock and radicular left leg pain. Imaging confirms significant lateral recess and foraminal stenosis at L4-5 and L5-S1 with advanced degenerative disc disease. Despite conservative management consisting of physiotherapy and injection therapy his overall quality of life is continued to deteriorate. As result we have elected to move forward with a revision fusion surgery. I have gone over the surgical procedure in great detail which would be a two-level TLIF L4-S1. All of his questions were addressed. The primary goal of this operation is reduction in his pain and improvement in his quality of life. I have stressed to him that given number of surgeries had in the scar tissue that is developed while there is a chance for complete elimination of his pain our main goal is reduction. The patient has expressed an understanding of the risks and benefits as well as the goal of surgery and has expressed a desire to move forward. Risks and benefits of posterior spinal fusion: Infection, bleeding, death, stroke, paralysis, ongoing or worse pain, need for additional surgery, nonunion, leak of spinal fluid, adjacent segment degeneration requiring additional fusion surgery, Injury to abdominal vessels that can require anterior surgery to stop bleeding. Malposition of the cage and/or pedicle screws that could require additional surgery. Loss of bowel and bladder control. Postoperative hematoma causing neurologic compression that could require urgent or emergent re-operation. Risks and benefits of lumbar decompression/discectomy: Infection, bleeding, death, stroke, paralysis, ongoing or worse pain, need  for additional surgery, leak of spinal fluid, adjacent segment degeneration requiring additional surgery, post-operative hematoma formation that can result in neurological compromise and the need for urgent/emergent re-operation. Loss in bowel and bladder control. Injury to major vessels that could result in the need for urgent abdominal surgery to stop bleeding. Risk of deep venous thrombosis (DVT) and the need for additional treatment.

## 2023-12-03 NOTE — Transfer of Care (Signed)
 Immediate Anesthesia Transfer of Care Note  Patient: James Wilkins  Procedure(s) Performed: TRANSFORAMINAL LUMBAR INTERBODY FUSION (TLIF) L4-S1 (Spine Lumbar)  Patient Location: PACU  Anesthesia Type:General  Level of Consciousness: drowsy, patient cooperative, and responds to stimulation  Airway & Oxygen Therapy: Patient Spontanous Breathing and Patient connected to face mask oxygen  Post-op Assessment: Report given to RN, Post -op Vital signs reviewed and stable, Patient moving all extremities, and Patient moving all extremities X 4  Post vital signs: Reviewed and stable  Last Vitals:  Vitals Value Taken Time  BP 153/82 12/03/23 1845  Temp    Pulse 99 12/03/23 1852  Resp 22 12/03/23 1852  SpO2 100 % 12/03/23 1852  Vitals shown include unfiled device data.  Last Pain:  Vitals:   12/03/23 0939  PainSc: 0-No pain         Complications: No notable events documented.

## 2023-12-03 NOTE — Brief Op Note (Signed)
 12/03/2023  5:46 PM  PATIENT:  Connye Burkitt Kauth  63 y.o. male  PRE-OPERATIVE DIAGNOSIS:  Reoccurent stenosis with radiculopathy L4-S1  POST-OPERATIVE DIAGNOSIS:  Reoccurent stenosis with radiculopathy L4-S1  PROCEDURE:  Procedure(s) with comments: TRANSFORAMINAL LUMBAR INTERBODY FUSION (TLIF) L4-S1 (N/A) - 360 min  SURGEON:  Surgeons and Role:    Venita Lick, MD - Primary  PHYSICIAN ASSISTANT: Luther Bradley, PA    ANESTHESIA:   general  EBL:  400 mL   BLOOD ADMINISTERED:none  DRAINS: none   LOCAL MEDICATIONS USED:  MARCAINE     SPECIMEN:  No Specimen  DISPOSITION OF SPECIMEN:  N/A  COUNTS:  YES  TOURNIQUET:  * No tourniquets in log *  DICTATION: .Dragon Dictation  PLAN OF CARE: Admit to inpatient   PATIENT DISPOSITION:  PACU - hemodynamically stable.

## 2023-12-03 NOTE — Anesthesia Procedure Notes (Addendum)
 Procedure Name: Intubation Date/Time: 12/03/2023 11:37 AM  Performed by: Darlina Guys, CRNAPre-anesthesia Checklist: Patient identified, Emergency Drugs available, Suction available and Patient being monitored Patient Re-evaluated:Patient Re-evaluated prior to induction Oxygen Delivery Method: Circle system utilized Preoxygenation: Pre-oxygenation with 100% oxygen Induction Type: IV induction Laryngoscope Size: 3 and Glidescope Grade View: Grade I Tube type: Oral Tube size: 7.5 mm Number of attempts: 1 Airway Equipment and Method: Stylet and Oral airway Placement Confirmation: ETT inserted through vocal cords under direct vision, positive ETCO2 and breath sounds checked- equal and bilateral Secured at: 23 cm Tube secured with: Tape Dental Injury: Teeth and Oropharynx as per pre-operative assessment  Comments: Atraumatic induction/intubation with G/S (prev hx of grade 3 view with DL). Dentition and oral mucosa as per preop. 2 soft bite blocks inserted prior to flipping prone (Neuromonitoring with motors)

## 2023-12-03 NOTE — Discharge Instructions (Signed)

## 2023-12-04 ENCOUNTER — Other Ambulatory Visit: Payer: Self-pay

## 2023-12-04 ENCOUNTER — Encounter (HOSPITAL_COMMUNITY): Payer: Self-pay | Admitting: Orthopedic Surgery

## 2023-12-04 NOTE — Evaluation (Signed)
 Physical Therapy Evaluation Patient Details Name: James Wilkins MRN: [id number] DOB: Apr 20, 1961 Today's Date: 12/04/2023  History of Present Illness  63 y.o. male presents to [facility] hospital on 12/03/2023 with back and LLE neuropathic pain. Pt underwent L4-S1 TLIF on 2/27. PMH includes AVR, celiac disease, GERD, OA, HLD, HTN, hypothyroidism, Thoracic ascending aortic aneurysm.  Clinical Impression  Pt presents to PT mobilizing well s/p back surgery. Pt is able to ambulate and negotiate stairs independently. Pt has good knowledge of back precautions from his prior surgeries and maintains precautions well throughout session. Pt has no further acute PT needs at this time. PT signing off.        If plan is discharge home, recommend the following:     Can travel by private vehicle        Equipment Recommendations None recommended by PT  Recommendations for Other Services       Functional Status Assessment Patient has had a recent decline in their functional status and demonstrates the ability to make significant improvements in function in a reasonable and predictable amount of time.     Precautions / Restrictions Precautions Precautions: Back Precaution Booklet Issued: Yes (comment) (pt already with back precautions handout pre-op) Recall of Precautions/Restrictions: Intact Required Braces or Orthoses: Spinal Brace Spinal Brace: Lumbar corset;Applied in sitting position Restrictions Weight Bearing Restrictions Per Provider Order: No      Mobility  Bed Mobility Overal bed mobility: Modified Independent             General bed mobility comments: verbal cues for log roll    Transfers Overall transfer level: Independent Equipment used: None                    Ambulation/Gait Ambulation/Gait assistance: Independent Gait Distance (Feet): 400 Feet Assistive device: None Gait Pattern/deviations: WFL(Within Functional Limits) Gait velocity: functional Gait velocity  interpretation: >2.62 ft/sec, indicative of community ambulatory   General Gait Details: steady step through gait  Stairs Stairs: Yes Stairs assistance: Modified independent (Device/Increase time) Stair Management: One rail Left, Alternating pattern Number of Stairs: 3    Wheelchair Mobility     Tilt Bed    Modified Rankin (Stroke Patients Only)       Balance Overall balance assessment: Independent                                           Pertinent Vitals/Pain Pain Assessment Pain Assessment: Faces Faces Pain Scale: Hurts a little bit Pain Location: buttock Pain Descriptors / Indicators: Sore Pain Intervention(s): Monitored during session    Home Living Family/patient expects to be discharged to:: Private residence Living Arrangements: Spouse/significant other Available Help at Discharge: Family;Available PRN/intermittently Type of Home: Mobile home Home Access: Stairs to enter Entrance Stairs-Rails: Can reach both Entrance Stairs-Number of Steps: 3   Home Layout: One level Home Equipment: Agricultural consultant (2 wheels);Cane - single point      Prior Function Prior Level of Function : Independent/Modified Independent;Working/employed;Driving             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       Extremity/Trunk Assessment   Upper Extremity Assessment Upper Extremity Assessment: Overall WFL for tasks assessed    Lower Extremity Assessment Lower Extremity Assessment: Overall WFL for tasks assessed    Cervical / Trunk Assessment Cervical / Trunk Assessment: Back Surgery  Communication   Communication Communication: No apparent difficulties    Cognition Arousal: Alert Behavior During Therapy: WFL for tasks assessed/performed   PT - Cognitive impairments: No apparent impairments                         Following commands: Intact       Cueing Cueing Techniques: Verbal cues      General Comments General comments (skin integrity, edema, etc.): VSS on RA    Exercises     Assessment/Plan    PT Assessment Patient does not need any further PT services  PT Problem List         PT Treatment Interventions      PT Goals (Current goals can be found in the Care Plan section)       Frequency       Co-evaluation               AM-PAC PT "6 Clicks" Mobility  Outcome Measure Help needed turning from your back to your side while in a flat bed without using bedrails?: None Help needed moving from lying on your back to sitting on the side of a flat bed without using bedrails?: None Help needed moving to and from a bed to a chair (including a wheelchair)?: None Help needed standing up from a chair using your arms (e.g., wheelchair or bedside chair)?: None Help needed to walk in hospital room?: None Help needed climbing 3-5 steps with a railing? : None 6 Click Score: 24    End of Session Equipment Utilized During Treatment: Back brace Activity Tolerance: Patient tolerated treatment well Patient left: in chair;with call bell/phone within reach Nurse Communication: Mobility status PT Visit Diagnosis: Other abnormalities of gait and mobility (R26.89)    Time: 1610-9604 PT Time Calculation (min) (ACUTE ONLY): 33 min   Charges:   PT Evaluation $PT Eval Low Complexity: 1 Low   PT General Charges $$ ACUTE PT VISIT: 1 Visit         Arlyss Gandy, PT, DPT Acute Rehabilitation Office [phone]   Arlyss Gandy 12/04/2023, 9:00 AM

## 2023-12-04 NOTE — Progress Notes (Addendum)
    Subjective: Procedure(s) (LRB): TRANSFORAMINAL LUMBAR INTERBODY FUSION (TLIF) L4-S1 (N/A) 1 Day Post-Op  Patient reports pain as 3 on 0-10 scale.  Reports decreased leg pain denies incisional back pain   Negative void Negative bowel movement Positive flatus Negative chest pain or shortness of breath  Objective: Vital signs in last 24 hours: Temp:  [97.8 F (36.6 C)-99.2 F (37.3 C)] 98.4 F (36.9 C) (02/28 0419) Pulse Rate:  [52-99] 76 (02/28 0419) Resp:  [14-25] 17 (02/28 0419) BP: (119-168)/(57-97) 119/57 (02/28 0419) SpO2:  [93 %-100 %] 96 % (02/28 0419) Weight:  [77.1 kg] 77.1 kg (02/27 0855)  Intake/Output from previous day: 02/27 0701 - 02/28 0700 In: 2063 [P.O.:240; I.V.:1803; IV Piggyback:20] Out: 3300 [Urine:2900; Blood:400]  Labs: No results for input(s): "WBC", "RBC", "HCT", "PLT" in the last 72 hours. No results for input(s): "NA", "K", "CL", "CO2", "BUN", "CREATININE", "GLUCOSE", "CALCIUM" in the last 72 hours. No results for input(s): "LABPT", "INR" in the last 72 hours.  Physical Exam: Neurologically intact Neurovascular intact Sensation intact distally Intact pulses distally Dorsiflexion/Plantar flexion intact Incision: dressing C/D/I Compartment soft Body mass index is 27.44 kg/m.   Assessment/Plan: Patient stable  xrays n/a Start mobilization with PT today  Continue incentive spirometry  Continue care   Gohan is a very pleasant 63 year old male who is POD 1 from TLIF L4-L5 and L5-S1, overall surgery was successful and went well. Post-op care was complicated by difficulty awaking yesterday; narcan was administered. Today, the patient reports mild pain. States pre-operative leg pain is improved. Overall, patient is doing well.  PLAN: Continue mobilization with PT and d/c to home possibly today or Saturday pending PT clearance   [employer] [facility] [phone]   Agree with above.  Patient's preoperative radicular leg  pain has improved.  Primary complaint is incisional back pain.  Overall he is doing exceptionally well.  Patient has already had positive flatus, and his Foley was just removed.

## 2023-12-04 NOTE — Progress Notes (Signed)
 Pt. Reports small amount of urine voided this morning, but asked to try again and measure it. Pt. Visualized sitting on the side of the bed eating his lunch and States he has not been able to do so yet, but will try again in a little bit. Will continue to monitor.

## 2023-12-04 NOTE — Evaluation (Signed)
 Occupational Therapy Evaluation Patient Details Name: James Wilkins MRN: [id number] DOB: Aug 16, 1961 Today's Date: 12/04/2023   History of Present Illness   63 y.o. male presents to [facility] hospital on 12/03/2023 with back and LLE neuropathic pain. Pt underwent L4-S1 TLIF on 2/27. PMH includes AVR, celiac disease, GERD, OA, HLD, HTN, hypothyroidism, Thoracic ascending aortic aneurysm.     Clinical Impressions Patient admitted for above and presents with problem list below.  PTA pt was independent and working. Patient was educated on brace mgmt and wear schedule, back precautions, ADL compensatory techniques, AE/DME, mobility progression, safety and recommendations.  Today, pt demonstrated ability to complete transfers using RW with modified independence, functional mobility using RW with modified independence, and ADLs with up to supervision assist after education of compensatory strategies for adherence to back precautions.  Discussed use of long sponge and grab bar standing in shower to assist with LB bathing, equipment pt already has. At discharge, pt will have support from spouse as needed.  Based on performance today, no further OT needs identified.  OT will sign off.     If plan is discharge home, recommend the following:   A little help with bathing/dressing/bathroom;Assistance with cooking/housework;Assist for transportation     Functional Status Assessment         Equipment Recommendations   None recommended by OT     Recommendations for Other Services         Precautions/Restrictions   Precautions Precautions: Back Precaution Booklet Issued: Yes (comment) (pt already with back precautions handout pre-op) Recall of Precautions/Restrictions: Intact Required Braces or Orthoses: Spinal Brace Spinal Brace: Lumbar corset Restrictions Weight Bearing Restrictions Per Provider Order: No     Mobility Bed Mobility               General bed mobility comments: OOB  upon entry    Transfers Overall transfer level: Independent Equipment used: None                      Balance Overall balance assessment: Independent                                         ADL either performed or assessed with clinical judgement   ADL Overall ADL's : Needs assistance/impaired     Grooming: Supervision/safety;Standing;Cueing for compensatory techniques       Lower Body Bathing: Supervison/ safety;With adaptive equipment;Cueing for back precautions;Sit to/from stand   Upper Body Dressing : Set up;Sitting   Lower Body Dressing: Supervision/safety;Sit to/from stand;Cueing for back precautions;Cueing for compensatory techniques   Toilet Transfer: Modified Independent;Ambulation Toilet Transfer Details (indicate cue type and reason): RW Toileting- Clothing Manipulation and Hygiene: Modified independent;Cueing for back precautions;Cueing for compensatory techniques;Sit to/from stand   Tub/ Shower Transfer: Supervision/safety;Adhering to back precautions;Cueing for safety;Ambulation;Rolling walker (2 wheels)   Functional mobility during ADLs: Modified independent;Rolling walker (2 wheels);Cueing for safety       Vision Baseline Vision/History: 0 No visual deficits Vision Assessment?: No apparent visual deficits     Perception         Praxis         Pertinent Vitals/Pain Pain Assessment Pain Assessment: No/denies pain Pain Intervention(s): Monitored during session     Extremity/Trunk Assessment Upper Extremity Assessment Upper Extremity Assessment: Generalized weakness;Overall [facility] for tasks assessed   Lower Extremity Assessment Lower Extremity Assessment: Defer to PT evaluation  Cervical / Trunk Assessment Cervical / Trunk Assessment: Back Surgery   Communication Communication Communication: No apparent difficulties   Cognition Arousal: Alert Behavior During Therapy: WFL for tasks assessed/performed Cognition: No  apparent impairments                               Following commands: Intact       Cueing  General Comments   Cueing Techniques: Verbal cues      Exercises     Shoulder Instructions      Home Living Family/patient expects to be discharged to:: Private residence Living Arrangements: Spouse/significant other Available Help at Discharge: Family;Available PRN/intermittently Type of Home: Mobile home Home Access: Stairs to enter Entrance Stairs-Number of Steps: 3 Entrance Stairs-Rails: Can reach both Home Layout: One level     Bathroom Shower/Tub: Producer, television/film/video: Standard     Home Equipment: Agricultural consultant (2 wheels);Cane - single point          Prior Functioning/Environment Prior Level of Function : Independent/Modified Independent;Working/employed;Driving             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 ADLs Comments: ind    OT Problem List: Decreased knowledge of precautions;Decreased knowledge of use of DME or AE   OT Treatment/Interventions:        OT Goals(Current goals can be found in the care plan section)   Acute Rehab OT Goals Patient Stated Goal: home OT Goal Formulation: With patient   OT Frequency:       Co-evaluation              AM-PAC OT "6 Clicks" Daily Activity     Outcome Measure Help from another person eating meals?: None Help from another person taking care of personal grooming?: A Little Help from another person toileting, which includes using toliet, bedpan, or urinal?: A Little Help from another person bathing (including washing, rinsing, drying)?: A Little Help from another person to put on and taking off regular upper body clothing?: None Help from another person to put on and taking off regular lower body clothing?: A Little 6 Click Score: 20   End of Session Equipment Utilized During Treatment: Rolling walker (2 wheels);Back brace Nurse  Communication: Mobility status  Activity Tolerance: Patient tolerated treatment well Patient left: in chair;with call bell/phone within reach;with family/visitor present  OT Visit Diagnosis: Other abnormalities of gait and mobility (R26.89)                Time: 4098-1191 OT Time Calculation (min): 13 min Charges:  OT General Charges $OT Visit: 1 Visit OT Evaluation $OT Eval Low Complexity: 1 Low  James Wilkins, OT Acute Rehabilitation Services Office [phone] Secure Chat Preferred    James Wilkins 12/04/2023, 11:57 AM

## 2023-12-05 NOTE — Progress Notes (Signed)
 Patient ID: KEDRON UNO, male   DOB: [birth year], 63 y.o.   MRN: [id number]   12/05/23 1608  Vitals  Temp (!) 103 F (39.4 C)  Temp Source Oral  BP (!) 139/59  MAP (mmHg) 82  BP Location Right Arm  BP Method Automatic  Patient Position (if appropriate) Lying  Pulse Rate 92  Pulse Rate Source Monitor  Resp 16  MEWS COLOR  MEWS Score Color Yellow  Oxygen Therapy  SpO2 94 %  O2 Device Room Air  MEWS Score  MEWS Temp 2  MEWS Systolic 0  MEWS Pulse 0  MEWS RR 0  MEWS LOC 0  MEWS Score 2   [facility] PA called. Temp recheck was 99.9. Tylenol given. Fluids and Incentive Spirometry strongly encouraged.  Lidia Collum, RN

## 2023-12-05 NOTE — Progress Notes (Signed)
    Subjective: Patient seen in rounds for Dr. Shon Baton.  Patient reports pain as moderate.  Denies N/V/CP/SOB/Abd pain. He reports right thigh pain (reports prior to surgery as well). He has some pain with movement but overall okay.  He has not had BM put positive flatus.  He reports he ambulated well with PT yesterday.   Objective:   VITALS:   Vitals:   12/04/23 1949 12/05/23 0331 12/05/23 0757 12/05/23 1234  BP: (!) 154/54 (!) 118/54 133/67 (!) 144/64  Pulse: 84 82 89 91  Resp: 18 18 17 18   Temp: 98.5 F (36.9 C) 99.4 F (37.4 C) 97.9 F (36.6 C) 98.6 F (37 C)  TempSrc: Oral Oral Oral Oral  SpO2: 99% 92% (!) 86% 97%  Weight:      Height:        NAD Neurologically intact ABD soft Neurovascular intact Sensation intact distally Intact pulses distally Dorsiflexion/Plantar flexion intact Incision: dressing C/D/I No cellulitis present   Lab Results  Component Value Date   WBC 6.5 11/27/2023   HGB 12.4 (L) 11/27/2023   HCT 38.4 (L) 11/27/2023   MCV 89.9 11/27/2023   PLT 225 11/27/2023   BMET    Component Value Date/Time   NA 140 11/27/2023 0913   NA 140 11/10/2022 0910   K 4.4 11/27/2023 0913   CL 104 11/27/2023 0913   CO2 29 11/27/2023 0913   GLUCOSE 96 11/27/2023 0913   BUN 17 11/27/2023 0913   BUN 13 11/10/2022 0910   CREATININE 1.46 (H) 11/27/2023 0913   CALCIUM 9.3 11/27/2023 0913   EGFR 60 11/10/2022 0910   GFRNONAA 54 (L) 11/27/2023 0913     Assessment/Plan: 2 Days Post-Op   Principal Problem:   S/P lumbar fusion   WBAT with walker PO pain control PT/OT: Continue.  Dispo:  -Patient has cleared PT. He still has not had a BM. D/c home once patient has had bowel movement. Continue miralax.    Clois Dupes 12/05/2023, 3:06 PM   [facility]  [place] [address], [place], [place] [address] Phone: [phone] [email or web address] Facebook  [employer]

## 2023-12-06 NOTE — Progress Notes (Signed)
   Subjective: 3 Days Post-Op Procedure(s) (LRB): TRANSFORAMINAL LUMBAR INTERBODY FUSION (TLIF) L4-S1 (N/A)  Pt c/o mild to moderate soreness in the lumbar spine but ready to d/c home Pt passed therapy yesterday and had BM Denies any new symptoms or issues overnight Patient reports pain as moderate.  Objective:   VITALS:   Vitals:   12/06/23 0634 12/06/23 0750  BP:  (!) 130/55  Pulse:  79  Resp:  18  Temp: 99 F (37.2 C) 99.1 F (37.3 C)  SpO2:  99%    Lumbar spine incision healing well Nv intact distally No rashes or edema distally No signs of infection or dvt   LABS No results for input(s): "HGB", "HCT", "WBC", "PLT" in the last 72 hours.  No results for input(s): "NA", "K", "BUN", "CREATININE", "GLUCOSE" in the last 72 hours.   Assessment/Plan: 3 Days Post-Op Procedure(s) (LRB): TRANSFORAMINAL LUMBAR INTERBODY FUSION (TLIF) L4-S1 (N/A) D/c home today F/u with Dr. Shon Baton in 2 weeks Pain management Pulmonary toilet Patient and family in agreement     Brad Antonieta Iba, MPAS [facility] now [facility]  [place] [address], [address], [place], [place] [address] Phone: [phone] [email or web address] Facebook  [employer]

## 2023-12-06 NOTE — Progress Notes (Signed)
 Discharge packet(AVS ) provided to pt/spouse with instructions. Pt & spouse verbalized understanding of instructions. No complaints. Pt d/c to home as ordered, Spouse is responsible for his ride.

## 2023-12-06 NOTE — Discharge Summary (Signed)
 In most cases prophylactic antibiotics for Dental procdeures after total joint surgery are not necessary.  Exceptions are as follows:  1. History of prior total joint infection  2. Severely immunocompromised (Organ Transplant, cancer chemotherapy, Rheumatoid biologic meds such as Humera)  3. Poorly controlled diabetes (A1C &gt; 8.0, blood glucose over 200)  If you have one of these conditions, contact your surgeon for an antibiotic prescription, prior to your dental procedure. Orthopedic Discharge Summary        Physician Discharge Summary  Patient ID: James Wilkins MRN: [id number] DOB/AGE: [birth year] 63 y.o.  Admit date: 12/03/2023 Discharge date: 12/06/2023   Procedures:  Procedure(s) (LRB): TRANSFORAMINAL LUMBAR INTERBODY FUSION (TLIF) L4-S1 (N/A)  Attending Physician:  Dr. Shon Baton  Admission Diagnoses:   lumbar disc herniation  Discharge Diagnoses:  lumbar disc herniation   Past Medical History:  Diagnosis Date   Aortic stenosis    s/p AVR // Echocardiogram 10/22: EF 60-65, no RWMA, mild LVH, Gr 1 DD, normal RVSF, trivial MR, s/p AVR with normal function (no AS, no AI, mean 8.4 mmHg), mild dilation of ascending aorta (43 mm)   Arthritis    "joints" (08/27/2017)   Bicuspid aortic valve    Celiac disease    Coronary artery disease involving native coronary artery of native heart with angina pectoris (HCC)    Dilation of esophagus    tortuous esophagus   Dyspnea    Gastritis    non erosive   GERD (gastroesophageal reflux disease)    Heart murmur    "ever since I was little" (08/27/2017)   Hemorrhoids    Hiatal hernia    HLD (hyperlipidemia)    Hypertension    Hypothyroidism    IDA (iron deficiency anemia)    S/P aortic valve replacement with bioprosthetic valve 08/25/2017   25 mm Edwards Intuity Elite bovine pericardial stented rapid deployment valve   Thoracic ascending aortic aneurysm (HCC)    CT 2018: 4 cm // Echo 2022: 4.3 cm // CT 11/22: 3.7 cm     PCP: Cleatis Polka., MD   Discharged Condition: good  Hospital Course:  Patient underwent the above stated procedure on 12/03/2023. Patient tolerated the procedure well and brought to the recovery room in good condition and subsequently to the floor. Patient had an uncomplicated hospital course and was stable for discharge.   Disposition: Discharge disposition: 01-Home or Self Care      with follow up in 2 weeks    Follow-up Information     Venita Lick, MD Follow up in 2 week(s).   Specialty: Orthopedic Surgery Why: For suture removal, For wound re-check, If symptoms worsen Contact information: [address] [place] [address] [phone]         Cleatis Polka., MD Follow up.   Specialty: Internal Medicine Contact information: [address] [place] [address] [phone]                 Dental Antibiotics:  In most cases prophylactic antibiotics for Dental procdeures after total joint surgery are not necessary.  Exceptions are as follows:  1. History of prior total joint infection  2. Severely immunocompromised (Organ Transplant, cancer chemotherapy, Rheumatoid biologic meds such as Humera)  3. Poorly controlled diabetes (A1C &gt; 8.0, blood glucose over 200)  If you have one of these conditions, contact your surgeon for an antibiotic prescription, prior to your dental procedure.  Discharge Instructions     Call MD / Call  911   Complete by: As directed    If you experience chest pain or shortness of breath, CALL 911 and be transported to the hospital emergency room.  If you develope a fever above 101 F, pus (white drainage) or increased drainage or redness at the wound, or calf pain, call your surgeon's office.   Constipation Prevention   Complete by: As directed    Drink plenty of fluids.  Prune juice may be helpful.  You may use a stool softener, such as Colace (over the counter) 100 mg twice a day.  Use  MiraLax (over the counter) for constipation as needed.   Diet - low sodium heart healthy   Complete by: As directed    Increase activity slowly as tolerated   Complete by: As directed    Post-operative opioid taper instructions:   Complete by: As directed    POST-OPERATIVE OPIOID TAPER INSTRUCTIONS: It is important to wean off of your opioid medication as soon as possible. If you do not need pain medication after your surgery it is ok to stop day one. Opioids include: Codeine, Hydrocodone(Norco, Vicodin), Oxycodone(Percocet, oxycontin) and hydromorphone amongst others.  Long term and even short term use of opiods can cause: Increased pain response Dependence Constipation Depression Respiratory depression And more.  Withdrawal symptoms can include Flu like symptoms Nausea, vomiting And more Techniques to manage these symptoms Hydrate well Eat regular healthy meals Stay active Use relaxation techniques(deep breathing, meditating, yoga) Do Not substitute Alcohol to help with tapering If you have been on opioids for less than two weeks and do not have pain than it is ok to stop all together.  Plan to wean off of opioids This plan should start within one week post op of your joint replacement. Maintain the same interval or time between taking each dose and first decrease the dose.  Cut the total daily intake of opioids by one tablet each day Next start to increase the time between doses. The last dose that should be eliminated is the evening dose.          Allergies as of 12/06/2023       Reactions   Gluten Meal    Other reaction(s): Celiac Disease   Wheat Diarrhea   Barley,rye        Medication List     STOP taking these medications    aspirin EC 81 MG tablet   Na Sulfate-K Sulfate-Mg Sulfate concentrate 17.5-3.13-1.6 GM/177ML Soln Commonly known as: SUPREP   sildenafil 100 MG tablet Commonly known as: VIAGRA       TAKE these medications     acetaminophen 500 MG tablet Commonly known as: TYLENOL Take 1,000 mg by mouth every 6 (six) hours as needed for moderate pain (pain score 4-6).   CENTRUM SILVER ULTRA MENS PO Take 1 tablet by mouth daily.   ezetimibe 10 MG tablet Commonly known as: ZETIA TAKE 1 TABLET(10 MG) BY MOUTH DAILY   gabapentin 300 MG capsule Commonly known as: NEURONTIN Take 300 mg by mouth 3 (three) times daily.   levothyroxine 75 MCG tablet Commonly known as: SYNTHROID Take 75 mcg by mouth daily before breakfast.   lisinopril 20 MG tablet Commonly known as: ZESTRIL TAKE 1 TABLET(20 MG) BY MOUTH DAILY   methocarbamol 500 MG tablet Commonly known as: ROBAXIN Take 1 tablet (500 mg total) by mouth every 8 (eight) hours as needed for up to 5 days for muscle spasms.   metoprolol tartrate 25 MG tablet Commonly known  as: LOPRESSOR Take 0.5 tablets (12.5 mg total) by mouth 2 (two) times daily.   mupirocin ointment 2 % Commonly known as: BACTROBAN Place 1 Application into the nose 2 (two) times daily for 60 doses. Use as directed 2 times daily for 5 days every other week for 6 weeks.   ondansetron 4 MG tablet Commonly known as: Zofran Take 1 tablet (4 mg total) by mouth every 8 (eight) hours as needed for nausea or vomiting.   oxyCODONE-acetaminophen 10-325 MG tablet Commonly known as: Percocet Take 1 tablet by mouth every 6 (six) hours as needed for up to 5 days for pain.   Proctozone-HC 2.5 % rectal cream Generic drug: hydrocortisone Place 1 Application rectally daily as needed for hemorrhoids.   rosuvastatin 40 MG tablet Commonly known as: CRESTOR TAKE 1 TABLET(40 MG) BY MOUTH DAILY          Signed: Thea Gist 12/06/2023, 8:28 AM  [facility] is now [employer] [address], [place], [place] [address] Phone: [phone] Facebook  Instagram  [employer]

## 2023-12-08 ENCOUNTER — Other Ambulatory Visit (HOSPITAL_COMMUNITY): Payer: Self-pay | Admitting: Orthopedic Surgery

## 2023-12-08 ENCOUNTER — Encounter (HOSPITAL_BASED_OUTPATIENT_CLINIC_OR_DEPARTMENT_OTHER)

## 2023-12-08 ENCOUNTER — Telehealth: Payer: Self-pay | Admitting: *Deleted

## 2023-12-08 ENCOUNTER — Ambulatory Visit (HOSPITAL_COMMUNITY)
Admission: RE | Admit: 2023-12-08 | Discharge: 2023-12-08 | Disposition: A | Discharge status: Home / Self Care | Source: Ambulatory Visit | Attending: Cardiovascular Disease | Admitting: Cardiovascular Disease

## 2023-12-08 DIAGNOSIS — M79662 Pain in left lower leg: Secondary | ICD-10-CM | POA: Diagnosis not present

## 2023-12-08 DIAGNOSIS — M7989 Other specified soft tissue disorders: Secondary | ICD-10-CM | POA: Diagnosis not present

## 2023-12-08 DIAGNOSIS — I7121 Aneurysm of the ascending aorta, without rupture: Secondary | ICD-10-CM

## 2023-12-08 DIAGNOSIS — M79661 Pain in right lower leg: Secondary | ICD-10-CM | POA: Insufficient documentation

## 2023-12-08 NOTE — Telephone Encounter (Signed)
-----   Message from Tereso Newcomer sent at 12/07/2023  9:03 AM EST ----- Results sent to Lorna Few via MyChart. See MyChart comments below.  Copy sent to PCP as FYI PLAN:  -Arrange CT angio chest aorta in 1 year  Mr. Dusza  Your CT does show stable enlargement of the ascending thoracic aorta.  Given its size on the study, we will need to continue to follow-up with another CT scan in 1 year. Tereso Newcomer, PA-C

## 2023-12-31 ENCOUNTER — Other Ambulatory Visit: Payer: Self-pay

## 2023-12-31 MED ORDER — EZETIMIBE 10 MG PO TABS: 10.0000 mg | ORAL_TABLET | Freq: Every day | ORAL | 3 refills | Status: AC

## 2023-12-31 NOTE — Telephone Encounter (Signed)
 Attempted to contact patient, left message that refills have been sent in (no other details)

## 2024-01-14 DIAGNOSIS — Z4889 Encounter for other specified surgical aftercare: Secondary | ICD-10-CM | POA: Diagnosis not present

## 2024-01-18 ENCOUNTER — Encounter: Payer: Self-pay | Admitting: Pharmacist

## 2024-01-18 ENCOUNTER — Ambulatory Visit: Payer: BC Managed Care – PPO | Attending: Student | Admitting: Pharmacist

## 2024-01-18 VITALS — BP 114/59 | HR 60

## 2024-01-18 DIAGNOSIS — I10 Essential (primary) hypertension: Secondary | ICD-10-CM

## 2024-01-18 NOTE — Patient Instructions (Signed)
 No BP medication Changes made by your pharmacist Nickola Baron, PharmD at today's visit:  Continue taking Lisinopril 20 mg daily.     HOW TO TAKE YOUR BLOOD PRESSURE AT HOME  Rest 5 minutes before taking your blood pressure.  Don't smoke or drink caffeinated beverages for at least 30 minutes before. Take your blood pressure before (not after) you eat. Sit comfortably with your back supported and both feet on the floor (don't cross your legs). Elevate your arm to heart level on a table or a desk. Use the proper sized cuff. It should fit smoothly and snugly around your bare upper arm. There should be enough room to slip a fingertip under the cuff. The bottom edge of the cuff should be 1 inch above the crease of the elbow. Ideally, take 3 measurements at one sitting and record the average.  Important lifestyle changes to control high blood pressure  Intervention  Effect on the BP  Lose extra pounds and watch your waistline Weight loss is one of the most effective lifestyle changes for controlling blood pressure. If you're overweight or obese, losing even a small amount of weight can help reduce blood pressure. Blood pressure might go down by about 1 millimeter of mercury (mm Hg) with each kilogram (about 2.2 pounds) of weight lost.  Exercise regularly As a general goal, aim for at least 30 minutes of moderate physical activity every day. Regular physical activity can lower high blood pressure by about 5 to 8 mm Hg.  Eat a healthy diet Eating a diet rich in whole grains, fruits, vegetables, and low-fat dairy products and low in saturated fat and cholesterol. A healthy diet can lower high blood pressure by up to 11 mm Hg.  Reduce salt (sodium) in your diet Even a small reduction of sodium in the diet can improve heart health and reduce high blood pressure by about 5 to 6 mm Hg.  Limit alcohol One drink equals 12 ounces of beer, 5 ounces of wine, or 1.5 ounces of 80-proof liquor.  Limiting  alcohol to less than one drink a day for women or two drinks a day for men can help lower blood pressure by about 4 mm Hg.   If you have any questions or concerns please use My Chart to send questions or call the office at [phone]

## 2024-01-18 NOTE — Progress Notes (Signed)
 Patient ID: James Wilkins                 DOB: [birth year]                      MRN: [id number]      HPI: James Wilkins is a 63 y.o. male referred by Tereso Newcomer, PA-C to HTN clinic. PMH is significant for CAD s/p CABG and bioprosthetic AVR, HTN, HLD, LBBB, CKD,  Patient was seen by PA for pre op visit on 11/20/2023, BP was elevated metoprolol dose was decreased from 12.5 mg twice daily daily to 12.5 mg daily due to sinus bradycardia.   Patient presented today for hypertension clinic. Brought in home BP monitor. Validated, found to be accurate. Reports Home BP ~120/55-60 heart rate 50-60. Had back surgery few weeks ago healing good from that except the was surface infection on stiches getting that fixed and will be starting Rehab in 2 weeks. Eats healthy does not eat out much due to gluten intolerance. Likes to drink 3-4 Dr.Pepper. also drinks plenty of water. Back surgery limits exercise capacity   1st reading on home cuff 103/53 heart rate 60  1st reading on office cuff 112/60 heart rate 60 2nd reading on home cuff 114/53 heart rate 59  2nd reading on office cuff 114/59 heart rate 60  Current HTN meds: Lisinopril 20 mg daily  Previously tried: none  BP goal: <130/80 Cr Cl 56 mL/min ( Sr Cr 1.46)  Family History:  Relation Problem Comments  Mother (Deceased) Heart disease     Father (Deceased) Brain cancer   Diabetes Type II  Heart disease     Sister (Deceased) Colon polyps     Sister Metallurgist)   Brother (Alive) Heart disease     Brother (Alive) Heart disease     Brother (Alive) Heart disease     Brother (Alive) Heart disease     Paternal Uncle Diabetes     Other Cancer   Hypertension       Social History:  Alcohol: none  Smoking : never  Diet: low salt, fat fat diet, follows gluten free diet  Exercise: none  Recovering from back surgery, will be going for rehab    Home BP readings: ~120/55-60 heart rate 50-60  Wt Readings from Last 3 Encounters:  12/03/23 170 lb  (77.1 kg)  11/27/23 175 lb 4.8 oz (79.5 kg)  11/20/23 176 lb (79.8 kg)   BP Readings from Last 3 Encounters:  01/18/24 (!) 114/59  12/06/23 (!) 130/55  11/27/23 (!) 159/74   Pulse Readings from Last 3 Encounters:  01/18/24 60  12/06/23 79  11/27/23 (!) 51    Renal function: CrCl cannot be calculated (Patient's most recent lab result is older than the maximum 21 days allowed.).  Past Medical History:  Diagnosis Date   Aortic stenosis    s/p AVR // Echocardiogram 10/22: EF 60-65, no RWMA, mild LVH, Gr 1 DD, normal RVSF, trivial MR, s/p AVR with normal function (no AS, no AI, mean 8.4 mmHg), mild dilation of ascending aorta (43 mm)   Arthritis    "joints" (08/27/2017)   Bicuspid aortic valve    Celiac disease    Coronary artery disease involving native coronary artery of native heart with angina pectoris (HCC)    Dilation of esophagus    tortuous esophagus   Dyspnea    Gastritis    non erosive   GERD (gastroesophageal reflux disease)  Heart murmur    "ever since I was little" (08/27/2017)   Hemorrhoids    Hiatal hernia    HLD (hyperlipidemia)    Hypertension    Hypothyroidism    IDA (iron deficiency anemia)    S/P aortic valve replacement with bioprosthetic valve 08/25/2017   25 mm [employer] Intuity Elite bovine pericardial stented rapid deployment valve   Thoracic ascending aortic aneurysm (HCC)    CT 2018: 4 cm // Echo 2022: 4.3 cm // CT 11/22: 3.7 cm    Current Outpatient Medications on File Prior to Visit  Medication Sig Dispense Refill   acetaminophen (TYLENOL) 500 MG tablet Take 1,000 mg by mouth every 6 (six) hours as needed for moderate pain (pain score 4-6).     ezetimibe (ZETIA) 10 MG tablet Take 1 tablet (10 mg total) by mouth daily. 90 tablet 3   gabapentin (NEURONTIN) 300 MG capsule Take 300 mg by mouth 3 (three) times daily.     hydrocortisone (PROCTOZONE-HC) 2.5 % rectal cream Place 1 Application rectally daily as needed for hemorrhoids.      levothyroxine (SYNTHROID) 75 MCG tablet Take 75 mcg by mouth daily before breakfast.     lisinopril (ZESTRIL) 20 MG tablet TAKE 1 TABLET(20 MG) BY MOUTH DAILY 90 tablet 0   metoprolol tartrate (LOPRESSOR) 25 MG tablet Take 0.5 tablets (12.5 mg total) by mouth 2 (two) times daily. 90 tablet 3   Multiple Vitamins-Minerals (CENTRUM SILVER ULTRA MENS PO) Take 1 tablet by mouth daily.     ondansetron (ZOFRAN) 4 MG tablet Take 1 tablet (4 mg total) by mouth every 8 (eight) hours as needed for nausea or vomiting. 20 tablet 0   rosuvastatin (CRESTOR) 40 MG tablet TAKE 1 TABLET(40 MG) BY MOUTH DAILY 90 tablet 0   No current facility-administered medications on file prior to visit.    Allergies  Allergen Reactions   Gluten Meal     Other reaction(s): Celiac Disease   Wheat Diarrhea    Barley,rye    Blood pressure (!) 114/59, pulse 60, SpO2 99%.   Assessment/Plan:  1. Hypertension -  Essential hypertension Assessment: BP is controlled in office BP 114/59 mmHg goal <130/80  Home BP ~120/55-60 heart rate 50-60 Home BP monitor validated - reads within 5 points compared to office readings  Tolerates current BP meds well without any side effects Denies SOB, palpitation, chest pain, headaches,or swelling Due to back surgery exercise capacity is limited  Reiterated the importance of low salt diet   Plan:  No medication changes  Continue taking lisinopril 20 mg daily at night  Patient to keep record of BP readings with heart rate and report to us  at the next visit Patient to let us  know if BP persistently stays above the goal  Annual follow up with Dr. Arlester Ladd due Feb 2026       Thank you  Nickola Baron, Pharm.D [facility] of  [facility] [address] [place], [place] [address]  Phone: [phone]; Fax: [phone]

## 2024-01-18 NOTE — Assessment & Plan Note (Signed)
 Assessment: BP is controlled in office BP 114/59 mmHg goal <130/80  Home BP ~120/55-60 heart rate 50-60 Home BP monitor validated - reads within 5 points compared to office readings  Tolerates current BP meds well without any side effects Denies SOB, palpitation, chest pain, headaches,or swelling Due to back surgery exercise capacity is limited  Reiterated the importance of low salt diet   Plan:  No medication changes  Continue taking lisinopril 20 mg daily at night  Patient to keep record of BP readings with heart rate and report to us  at the next visit Patient to let us  know if BP persistently stays above the goal  Annual follow up with Dr. Arlester Ladd due Feb 2026

## 2024-01-26 DIAGNOSIS — M5459 Other low back pain: Secondary | ICD-10-CM | POA: Diagnosis not present

## 2024-02-02 DIAGNOSIS — M5459 Other low back pain: Secondary | ICD-10-CM | POA: Diagnosis not present

## 2024-02-12 DIAGNOSIS — M5459 Other low back pain: Secondary | ICD-10-CM | POA: Diagnosis not present

## 2024-02-15 ENCOUNTER — Other Ambulatory Visit: Payer: Self-pay | Admitting: Physician Assistant

## 2024-02-19 DIAGNOSIS — M5459 Other low back pain: Secondary | ICD-10-CM | POA: Diagnosis not present

## 2024-02-24 DIAGNOSIS — Z4889 Encounter for other specified surgical aftercare: Secondary | ICD-10-CM | POA: Diagnosis not present

## 2024-02-26 ENCOUNTER — Other Ambulatory Visit: Payer: Self-pay | Admitting: Physician Assistant

## 2024-02-26 DIAGNOSIS — M5459 Other low back pain: Secondary | ICD-10-CM | POA: Diagnosis not present

## 2024-03-04 DIAGNOSIS — M5459 Other low back pain: Secondary | ICD-10-CM | POA: Diagnosis not present

## 2024-04-15 DIAGNOSIS — M545 Low back pain, unspecified: Secondary | ICD-10-CM | POA: Diagnosis not present

## 2024-06-22 DIAGNOSIS — I10 Essential (primary) hypertension: Secondary | ICD-10-CM | POA: Diagnosis not present

## 2024-06-23 DIAGNOSIS — M545 Low back pain, unspecified: Secondary | ICD-10-CM | POA: Diagnosis not present

## 2024-07-12 ENCOUNTER — Telehealth: Payer: Self-pay | Admitting: Cardiovascular Disease

## 2024-07-12 NOTE — Telephone Encounter (Signed)
 Pt c/o Shortness Of Breath: STAT if SOB developed within the last 24 hours or pt is noticeably SOB on the phone  1. Are you currently SOB (can you hear that pt is SOB on the phone)? No  2. How long have you been experiencing SOB? A week   3. Are you SOB when sitting or when up moving around? Moving around  4. Are you currently experiencing any other symptoms? Dizziness and legs go limp

## 2024-07-12 NOTE — Telephone Encounter (Signed)
 Call to patient, wife James Wilkins) answered. James reports patient has been having SOB on exertion for about a week, SOB resolves with rest. Spouse states he has been having dizziness as well but states that may be related to his history of vertigo. Spouse states patient's hearing is poor and he has difficulty talking on the phone so I was not able to assess patient's dizziness symptoms further. Spouse James notes patient has some swelling of his feet which has been consistent since his last back surgery in February 2025. She reports his BP is 113/48 and HR  70 most recently. James requests appt with Glendia Ferrier, offered appt with EMERSON Pavy on 10/13, but James would rather see Glendia, next appt is 10/20. ED precautions discussed, James [place] understanding to present to ED if SOB worsens or is accompanied by chest pain.

## 2024-07-13 NOTE — Telephone Encounter (Signed)
 I had a meeting on 10/14 that was canceled. I can see him at 8:00 or 8:25 on Tues 10/14 if he can do that. If symptoms get worse, go to the ED. Glendia Ferrier, PA-C    07/13/2024 12:50 PM

## 2024-07-14 NOTE — Telephone Encounter (Signed)
 I had a meeting on 10/14 that was canceled. I can see him at 8:00 or 8:25 on Tues 10/14 if he can do that. If symptoms get worse, go to the ED. Glendia Ferrier, PA-C    07/13/2024 12:50 PM    S/w Olam (dpr) and gave the information above. Appt made for 07/19/24 at 0800. She verbalized understanding of all information.

## 2024-07-15 DIAGNOSIS — M533 Sacrococcygeal disorders, not elsewhere classified: Secondary | ICD-10-CM | POA: Diagnosis not present

## 2024-07-15 DIAGNOSIS — M5416 Radiculopathy, lumbar region: Secondary | ICD-10-CM | POA: Diagnosis not present

## 2024-07-17 NOTE — Progress Notes (Signed)
 OFFICE NOTE:    Date:  07/19/2024  ID:  [name], DOB 1961-01-04, MRN [id number] PCP: Loreli Elsie JONETTA Mickey., MD  [facility] Providers Cardiologist:  Ozell Fell, MD Cardiology APP:  Lelon Glendia DASEN, PA-C       Coronary artery disease, BAV with aortic stenosis S/p CABG + bioprosthetic AVR 08/2017 TTE 07/26/21: EF 60-65, no RWMA, mild LVH, G1 DD, normal RVSF, trivial MR, AVR without AI or AS, ascending aorta 43 mm Mildly dilated ascending aorta CTA 11/11/22: Asc thoracic aorta ectasia 39 mm Chest CTA 11/27/23: ascending thoracic aorta 4.1 cm  Hypertension Hyperlipidemia Chronic kidney disease  LBBB GERD Carotid US  08/21/17: bilat 1-39 ABIs 07/19/21: normal         Discussed the use of AI scribe software for clinical note transcription with the patient, who gave verbal consent to proceed. History of Present Illness James Wilkins is a 63 y.o. male who returns for evaluation of shortness of breath. He was last seen in 11/2023. He called in last week with symptoms of dyspnea on exertion and dizziness.   He experiences shortness of breath during physical activities such as digging outside, which has been consistent since February following his back surgery. The shortness of breath has not worsened over time. He does not experience significant shortness of breath when climbing stairs or walking in stores. Occasionally, he feels a slight tightness in his chest when eating or exerting himself, but it does not radiate to his neck or arms. No episodes of syncope and he breathes comfortably when lying flat. He reports some swelling in his legs, particularly towards the end of the day, with L leg being more affected than the R. Swelling subsides by the next day. No calf pain. He has not been taking nitroglycerin  and occasionally takes Viagra.     ROS-See HPI     Studies Reviewed:  EKG Interpretation Date/Time:  Tuesday July 19 2024 08:10:47 EDT Ventricular Rate:  60 PR  Interval:  192 QRS Duration:  166 QT Interval:  460 QTC Calculation: 460 R Axis:   33  Text Interpretation: Normal sinus rhythm Left bundle branch block No significant change since last tracing Confirmed by Lelon Glendia [phone]) on 07/19/2024 8:12:22 AM    11/27/23: K 4.4, SCr 1.46, Hgb 12.4, Plt 225K         Physical Exam:  VS:  BP (!) 130/59 (BP Location: Left Arm, Patient Position: Sitting, Cuff Size: Normal)   Pulse (!) 58   Ht 5' 5 (1.651 m)   Wt 183 lb (83 kg)   SpO2 98%   BMI 30.45 kg/m        Wt Readings from Last 3 Encounters:  07/19/24 183 lb (83 kg)  12/03/23 170 lb (77.1 kg)  11/27/23 175 lb 4.8 oz (79.5 kg)    Constitutional:      Appearance: Healthy appearance. Not in distress.  Neck:     Vascular: No JVR. JVD normal.  Pulmonary:     Breath sounds: Normal breath sounds. No wheezing. No rales.  Cardiovascular:     Normal rate. Regular rhythm.     Murmurs: There is a grade 2/6 systolic murmur at the URSB and ULSB.  Edema:    Peripheral edema absent.  Abdominal:     Palpations: Abdomen is soft.       Assessment and Plan:    Assessment & Plan Shortness of breath Etiology not entirely clear. He has noted symptoms since his  back surgery in Feb. Question if symptoms related to deconditioning. Also consider angina, CHF, progressive valve disease, anemia. His BP would not likely tolerate addition of amlodipine. He uses PDE-5 inhibitors and therefore cannot have nitrates. We discussed the dangers of taking these together. Symptoms have been overall stable for the past several mos.  -Arrange Stress PET MPI -Arrange Echocardiogram -CMET, CBC, NT Pro BNP -NTG prn Rx sent to pharmacy -Follow up 3 mos or sooner is symptoms change, worsen. Coronary artery disease involving native coronary artery of native heart without angina pectoris Status post CABG plus bioprosthetic AVR in November 2018.  As noted, he has had shortness of breath with exertion and occasional  chest tightness associated.  EKG demonstrates left bundle branch block and is unchanged. -Continue ASA 81 mg daily, rosuvastatin  40 mg daily, metoprolol  tartrate 12.5 mg twice daily -Arrange stress PET MPI -Arrange echocardiogram -Prescription for nitroglycerin  as needed -Follow-up 3 months Ascending aorta dilation 39 mm on CT in 11/2022.  41 mm on CT in 11/2023. Repeat CT due in 11/2024. Bicuspid aortic valve Nonrheumatic aortic valve stenosis Status post bioprosthetic AVR November 2018. - Arrange follow-up echocardiogram Essential hypertension The patient's blood pressure is controlled on his current regimen.  Continue current therapy.  Pure hypercholesterolemia LDL optimal on most recent lab work.  Continue current Rx.       Informed Consent   Shared Decision Making/Informed Consent The risks [chest pain, shortness of breath, cardiac arrhythmias, dizziness, blood pressure fluctuations, myocardial infarction, stroke/transient ischemic attack, nausea, vomiting, allergic reaction, radiation exposure, metallic taste sensation and life-threatening complications (estimated to be 1 in 10,000)], benefits (risk stratification, diagnosing coronary artery disease, treatment guidance) and alternatives of a cardiac PET stress test were discussed in detail with James Wilkins and he agrees to proceed.     Dispo:  Return in about 3 months (around 10/19/2024) for Routine Follow Up, w/ Dr. Wonda, or Glendia Ferrier, PA-C.  Signed, Glendia Ferrier, PA-C

## 2024-07-17 NOTE — Assessment & Plan Note (Signed)
 Status post CABG plus bioprosthetic AVR in November 2018.  As noted, he has had shortness of breath with exertion and occasional chest tightness associated.  EKG demonstrates left bundle branch block and is unchanged. -Continue ASA 81 mg daily, rosuvastatin  40 mg daily, metoprolol  tartrate 12.5 mg twice daily -Arrange stress PET MPI -Arrange echocardiogram -Prescription for nitroglycerin  as needed -Follow-up 3 months

## 2024-07-17 NOTE — Assessment & Plan Note (Signed)
 Status post bioprosthetic AVR November 2018.***

## 2024-07-17 NOTE — Assessment & Plan Note (Addendum)
 39 mm on CT in 11/2022.  41 mm on CT in 11/2023. Repeat CT due in 11/2024.***

## 2024-07-19 ENCOUNTER — Encounter: Payer: Self-pay | Admitting: Physician Assistant

## 2024-07-19 ENCOUNTER — Ambulatory Visit: Attending: Physician Assistant | Admitting: Physician Assistant

## 2024-07-19 VITALS — BP 130/59 | HR 58 | Ht 65.0 in | Wt 183.0 lb

## 2024-07-19 DIAGNOSIS — I2510 Atherosclerotic heart disease of native coronary artery without angina pectoris: Secondary | ICD-10-CM | POA: Diagnosis not present

## 2024-07-19 DIAGNOSIS — E7800 Pure hypercholesterolemia, unspecified: Secondary | ICD-10-CM

## 2024-07-19 DIAGNOSIS — I10 Essential (primary) hypertension: Secondary | ICD-10-CM

## 2024-07-19 DIAGNOSIS — Q2381 Bicuspid aortic valve: Secondary | ICD-10-CM

## 2024-07-19 DIAGNOSIS — I77810 Thoracic aortic ectasia: Secondary | ICD-10-CM

## 2024-07-19 DIAGNOSIS — R0602 Shortness of breath: Secondary | ICD-10-CM

## 2024-07-19 DIAGNOSIS — I350 Nonrheumatic aortic (valve) stenosis: Secondary | ICD-10-CM

## 2024-07-19 DIAGNOSIS — H903 Sensorineural hearing loss, bilateral: Secondary | ICD-10-CM | POA: Diagnosis not present

## 2024-07-19 LAB — CBC

## 2024-07-19 MED ORDER — NITROGLYCERIN 0.4 MG SL SUBL: 0.4000 mg | SUBLINGUAL_TABLET | SUBLINGUAL | 11 refills | Status: AC | PRN

## 2024-07-19 NOTE — Assessment & Plan Note (Addendum)
The patient's blood pressure is controlled on his current regimen.  Continue current therapy.  

## 2024-07-19 NOTE — Patient Instructions (Addendum)
 Medication Instructions:  START Nitroglycerin  0.4mg  Take 1 as needed for emergency chest pain. Take first dose for emergency chest pain; WAIT 5 minutes and then take 2nd dose. IF still having pain if still having chest pain CALL 911 wait an additional 5 minutes before taking final dose. Do not take more than 3 doses in a day. DO NOT TAKE 72 HOURS PRIOR TO OR 72 HOURS AFTER TAKING ERECTILE DYSFUNCTION MEDIATIONS. *If you need a refill on your cardiac medications before your next appointment, please call your pharmacy*  Lab Work: TODAY-CMET, CBC, & BNP If you have labs (blood work) drawn today and your tests are completely normal, you will receive your results only by: MyChart Message (if you have MyChart) OR A paper copy in the mail If you have any lab test that is abnormal or we need to change your treatment, we will call you to review the results.  Testing/Procedures: PET STRESS TEST  Your physician has requested that you have an echocardiogram. Echocardiography is a painless test that uses sound waves to create images of your heart. It provides your doctor with information about the size and shape of your heart and how well your heart's chambers and valves are working. This procedure takes approximately one hour. There are no restrictions for this procedure. Please do NOT wear cologne, perfume, aftershave, or lotions (deodorant is allowed). Please arrive 15 minutes prior to your appointment time.  Please note: We ask at that you not bring children with you during ultrasound (echo/ vascular) testing. Due to room size and safety concerns, children are not allowed in the ultrasound rooms during exams. Our front office staff cannot provide observation of children in our lobby area while testing is being conducted. An adult accompanying a patient to their appointment will only be allowed in the ultrasound room at the discretion of the ultrasound technician under special circumstances. We apologize  for any inconvenience.  Follow-Up: At [facility], you and your health needs are our priority.  As part of our continuing mission to provide you with exceptional heart care, our providers are all part of one team.  This team includes your primary Cardiologist (physician) and Advanced Practice Providers or APPs (Physician Assistants and Nurse Practitioners) who all work together to provide you with the care you need, when you need it.  Your next appointment:   3 month(s)  Provider:   Ozell Fell, MD or Glendia Ferrier, PA   We recommend signing up for the patient portal called MyChart.  Sign up information is provided on this After Visit Summary.  MyChart is used to connect with patients for Virtual Visits (Telemedicine).  Patients are able to view lab/test results, encounter notes, upcoming appointments, etc.  Non-urgent messages can be sent to your provider as well.   To learn more about what you can do with MyChart, go to [email or web address].   Other Instructions     Please report to Radiology at the [facility] Main Entrance 30 minutes early for your test.  [address] [place], [place] [address]                         OR   Please report to Radiology at [facility] Main Entrance, medical mall, 30 mins prior to your test.  [address]  [place], [place]  How to Prepare for Your Cardiac PET/CT Stress Test:  Nothing to eat or drink, except water, 3  hours prior to arrival time.  NO caffeine/decaffeinated products, or chocolate 12 hours prior to arrival. (Please note decaffeinated beverages (teas/coffees) still contain caffeine).  If you have caffeine within 12 hours prior, the test will need to be rescheduled.  Medication instructions: Do not take erectile dysfunction medications for 72 hours prior to test (sildenafil, tadalafil) Do not take nitrates (isosorbide mononitrate, Ranexa) the day before or day of test Do not take  tamsulosin the day before or morning of test Hold theophylline containing medications for 12 hours. Hold Dipyridamole 48 hours prior to the test.  You may take your remaining medications with water.  Total time is 1 to 2 hours; you may want to bring reading material for the waiting time.  In preparation for your appointment, medication and supplies will be purchased.  Appointment availability is limited, so if you need to cancel or reschedule, please call the Radiology Department Scheduler at [phone] 24 hours in advance to avoid a cancellation fee of $100.00  What to Expect When you Arrive:  Once you arrive and check in for your appointment, you will be taken to a preparation room within the Radiology Department.  A technologist or Nurse will obtain your medical history, verify that you are correctly prepped for the exam, and explain the procedure.  Afterwards, an IV will be started in your arm and electrodes will be placed on your skin for EKG monitoring during the stress portion of the exam. Then you will be escorted to the PET/CT scanner.  There, staff will get you positioned on the scanner and obtain a blood pressure and EKG.  During the exam, you will continue to be connected to the EKG and blood pressure machines.  A small, safe amount of a radioactive tracer will be injected in your IV to obtain a series of pictures of your heart along with an injection of a stress agent.    After your Exam:  It is recommended that you eat a meal and drink a caffeinated beverage to counter act any effects of the stress agent.  Drink plenty of fluids for the remainder of the day and urinate frequently for the first couple of hours after the exam.  Your doctor will inform you of your test results within 7-10 business days.  For more information and frequently asked questions, please visit our website: [email or web address]  For questions about your test or how to prepare for your test,  please call: Cardiac Imaging Nurse Navigators Office: [phone]

## 2024-07-19 NOTE — Assessment & Plan Note (Signed)
LDL optimal on most recent lab work.  Continue current Rx.   

## 2024-07-19 NOTE — Assessment & Plan Note (Signed)
 Etiology not entirely clear. He has noted symptoms since his back surgery in Feb. Question if symptoms related to deconditioning. Also consider angina, CHF, progressive valve disease, anemia. His BP would not likely tolerate addition of amlodipine. He uses PDE-5 inhibitors and therefore cannot have nitrates. We discussed the dangers of taking these together. Symptoms have been overall stable for the past several mos.  -Arrange Stress PET MPI -Arrange Echocardiogram -CMET, CBC, NT Pro BNP -NTG prn Rx sent to pharmacy -Follow up 3 mos or sooner is symptoms change, worsen.

## 2024-07-20 ENCOUNTER — Other Ambulatory Visit: Payer: Self-pay | Admitting: Physician Assistant

## 2024-07-20 ENCOUNTER — Ambulatory Visit: Payer: Self-pay | Admitting: Physician Assistant

## 2024-07-20 DIAGNOSIS — Q2381 Bicuspid aortic valve: Secondary | ICD-10-CM

## 2024-07-20 DIAGNOSIS — D649 Anemia, unspecified: Secondary | ICD-10-CM

## 2024-07-20 LAB — COMPREHENSIVE METABOLIC PANEL WITH GFR
ALT: 25 IU/L (ref 0–44)
AST: 30 IU/L (ref 0–40)
Albumin: 4.5 g/dL (ref 3.9–4.9)
Alkaline Phosphatase: 78 IU/L (ref 47–123)
BUN/Creatinine Ratio: 10 (ref 10–24)
BUN: 16 mg/dL (ref 8–27)
Bilirubin Total: 0.3 mg/dL (ref 0.0–1.2)
CO2: 24 mmol/L (ref 20–29)
Calcium: 9.3 mg/dL (ref 8.6–10.2)
Chloride: 103 mmol/L (ref 96–106)
Creatinine, Ser: 1.56 mg/dL — ABNORMAL HIGH (ref 0.76–1.27)
Globulin, Total: 2.6 g/dL (ref 1.5–4.5)
Glucose: 84 mg/dL (ref 70–99)
Potassium: 4.5 mmol/L (ref 3.5–5.2)
Sodium: 142 mmol/L (ref 134–144)
Total Protein: 7.1 g/dL (ref 6.0–8.5)
eGFR: 49 mL/min/1.73 — ABNORMAL LOW (ref >59)

## 2024-07-20 LAB — CBC
Hematocrit: 33.2 % — AB (ref 37.5–51.0)
Hemoglobin: 9.6 g/dL — AB (ref 13.0–17.7)
MCH: 22.6 pg — AB (ref 26.6–33.0)
MCHC: 28.9 g/dL — AB (ref 31.5–35.7)
MCV: 78 fL — AB (ref 79–97)
Platelets: 301 x10E3/uL (ref 150–450)
RBC: 4.24 x10E6/uL (ref 4.14–5.80)
RDW: 15.4 % (ref 11.6–15.4)
WBC: 7.5 x10E3/uL (ref 3.4–10.8)

## 2024-07-20 LAB — PRO B NATRIURETIC PEPTIDE: NT-Pro BNP: 91 pg/mL (ref 0–210)

## 2024-07-21 DIAGNOSIS — H8111 Benign paroxysmal vertigo, right ear: Secondary | ICD-10-CM | POA: Diagnosis not present

## 2024-07-22 ENCOUNTER — Ambulatory Visit: Payer: Self-pay | Admitting: Physician Assistant

## 2024-07-22 LAB — CBC
Hematocrit: 31 % — ABNORMAL LOW (ref 37.5–51.0)
Hemoglobin: 9.1 g/dL — ABNORMAL LOW (ref 13.0–17.7)
MCH: 22.8 pg — ABNORMAL LOW (ref 26.6–33.0)
MCHC: 29.4 g/dL — ABNORMAL LOW (ref 31.5–35.7)
MCV: 78 fL — ABNORMAL LOW (ref 79–97)
Platelets: 289 x10E3/uL (ref 150–450)
RBC: 3.99 x10E6/uL — ABNORMAL LOW (ref 4.14–5.80)
RDW: 15.5 % — ABNORMAL HIGH (ref 11.6–15.4)
WBC: 6 x10E3/uL (ref 3.4–10.8)

## 2024-07-22 LAB — B12 AND FOLATE PANEL
Folate: 5 ng/mL (ref >3.0)
Vitamin B-12: 792 pg/mL (ref 232–1245)

## 2024-07-22 LAB — IRON,TIBC AND FERRITIN PANEL
Ferritin: 7 ng/mL — ABNORMAL LOW (ref 30–400)
Iron Saturation: 5 % — CL (ref 15–55)
Iron: 18 ug/dL — ABNORMAL LOW (ref 38–169)
Total Iron Binding Capacity: 397 ug/dL (ref 250–450)
UIBC: 379 ug/dL — ABNORMAL HIGH (ref 111–343)

## 2024-07-22 MED ORDER — FERROUS SULFATE 325 (65 FE) MG PO TABS: 325.0000 mg | ORAL_TABLET | Freq: Every day | ORAL | 0 refills | Status: DC

## 2024-07-25 ENCOUNTER — Ambulatory Visit: Admitting: Physician Assistant

## 2024-07-26 ENCOUNTER — Encounter (HOSPITAL_COMMUNITY): Payer: Self-pay

## 2024-07-27 ENCOUNTER — Other Ambulatory Visit: Payer: Self-pay | Admitting: Internal Medicine

## 2024-07-27 ENCOUNTER — Telehealth (HOSPITAL_COMMUNITY): Payer: Self-pay

## 2024-07-27 NOTE — Telephone Encounter (Signed)
 Auth Submission: NO AUTH NEEDED Site of care: Site of care: [facility] Payer: [employer] of  Medication & CPT/J Code(s) submitted: Feraheme (ferumoxytol) [id number] Diagnosis Code:  Route of submission (phone, fax, portal):  Phone # Fax # Auth type: Buy/Bill HB Units/visits requested: 510mg  x 2 doses Reference number:  Approval from: 07/27/24 to 10/05/24

## 2024-07-28 ENCOUNTER — Ambulatory Visit
Admission: RE | Admit: 2024-07-28 | Discharge: 2024-07-28 | Disposition: A | Discharge status: Home / Self Care | Source: Ambulatory Visit | Attending: Physician Assistant | Admitting: Physician Assistant

## 2024-07-28 DIAGNOSIS — I77810 Thoracic aortic ectasia: Secondary | ICD-10-CM | POA: Insufficient documentation

## 2024-07-28 DIAGNOSIS — R0602 Shortness of breath: Secondary | ICD-10-CM | POA: Diagnosis not present

## 2024-07-28 DIAGNOSIS — I350 Nonrheumatic aortic (valve) stenosis: Secondary | ICD-10-CM | POA: Insufficient documentation

## 2024-07-28 DIAGNOSIS — I2510 Atherosclerotic heart disease of native coronary artery without angina pectoris: Secondary | ICD-10-CM | POA: Insufficient documentation

## 2024-07-28 DIAGNOSIS — Q2381 Bicuspid aortic valve: Secondary | ICD-10-CM | POA: Diagnosis not present

## 2024-07-28 DIAGNOSIS — I10 Essential (primary) hypertension: Secondary | ICD-10-CM | POA: Diagnosis not present

## 2024-07-28 LAB — NM PET CT CARDIAC PERFUSION MULTI W/ABSOLUTE BLOODFLOW
Nuc Rest EF: 43 %
Nuc Stress EF: 51 %
Peak HR: 72 {beats}/min
Rest HR: 92 {beats}/min
Rest Nuclear Isotope Dose: 21.7 mCi
SRS: 4
SSS: 0
ST Depression (mm): 0 mm
Stress Nuclear Isotope Dose: 21.7 mCi
TID: 0.96

## 2024-07-28 MED ORDER — RUBIDIUM RB82 GENERATOR (RUBYFILL)
25.0000 | PACK | Freq: Once | INTRAVENOUS | Status: AC
Start: 2024-07-28 — End: 2024-07-28
  Administered 2024-07-28: 21.66 via INTRAVENOUS

## 2024-07-28 MED ORDER — REGADENOSON 0.4 MG/5ML IV SOLN
INTRAVENOUS | Status: AC
  Filled 2024-07-28: qty 5

## 2024-07-28 MED ORDER — RUBIDIUM RB82 GENERATOR (RUBYFILL)
25.0000 | PACK | Freq: Once | INTRAVENOUS | Status: AC
  Administered 2024-07-28: 21.72 via INTRAVENOUS

## 2024-07-28 MED ORDER — REGADENOSON 0.4 MG/5ML IV SOLN
0.4000 mg | Freq: Once | INTRAVENOUS | Status: AC
  Administered 2024-07-28: 0.4 mg via INTRAVENOUS
  Filled 2024-07-28: qty 5

## 2024-07-28 NOTE — Progress Notes (Signed)
 Patient presents for a cardiac PET stress test and tolerated procedure without incident. Patient maintained acceptable vital signs throughout the test and was offered caffeine  after test.  Patient ambulated out of department with a steady gait.

## 2024-07-29 DIAGNOSIS — D509 Iron deficiency anemia, unspecified: Secondary | ICD-10-CM | POA: Diagnosis not present

## 2024-08-03 DIAGNOSIS — H90A21 Sensorineural hearing loss, unilateral, right ear, with restricted hearing on the contralateral side: Secondary | ICD-10-CM | POA: Diagnosis not present

## 2024-08-05 ENCOUNTER — Ambulatory Visit
Admission: RE | Admit: 2024-08-05 | Discharge: 2024-08-05 | Disposition: A | Discharge status: Home / Self Care | Source: Ambulatory Visit | Attending: Internal Medicine | Admitting: Internal Medicine

## 2024-08-05 VITALS — BP 136/74 | HR 55 | Temp 97.4°F | Resp 17

## 2024-08-05 DIAGNOSIS — K900 Celiac disease: Secondary | ICD-10-CM | POA: Diagnosis not present

## 2024-08-05 DIAGNOSIS — D509 Iron deficiency anemia, unspecified: Secondary | ICD-10-CM | POA: Diagnosis not present

## 2024-08-05 MED ORDER — SODIUM CHLORIDE 0.9 % IV SOLN
510.0000 mg | Freq: Once | INTRAVENOUS | Status: AC
  Administered 2024-08-05: 510 mg via INTRAVENOUS
  Filled 2024-08-05: qty 17

## 2024-08-12 ENCOUNTER — Ambulatory Visit
Admission: RE | Admit: 2024-08-12 | Discharge: 2024-08-12 | Disposition: A | Discharge status: Home / Self Care | Source: Ambulatory Visit | Attending: Internal Medicine | Admitting: Internal Medicine

## 2024-08-12 VITALS — BP 138/71 | HR 59 | Temp 97.9°F | Resp 16 | Ht 65.0 in | Wt 180.0 lb

## 2024-08-12 DIAGNOSIS — K900 Celiac disease: Secondary | ICD-10-CM | POA: Insufficient documentation

## 2024-08-12 DIAGNOSIS — D509 Iron deficiency anemia, unspecified: Secondary | ICD-10-CM | POA: Diagnosis not present

## 2024-08-12 MED ORDER — SODIUM CHLORIDE 0.9 % IV SOLN
510.0000 mg | Freq: Once | INTRAVENOUS | Status: AC
  Administered 2024-08-12: 510 mg via INTRAVENOUS
  Filled 2024-08-12: qty 17

## 2024-08-17 DIAGNOSIS — Z981 Arthrodesis status: Secondary | ICD-10-CM | POA: Diagnosis not present

## 2024-08-17 DIAGNOSIS — M533 Sacrococcygeal disorders, not elsewhere classified: Secondary | ICD-10-CM | POA: Diagnosis not present

## 2024-08-17 DIAGNOSIS — M545 Low back pain, unspecified: Secondary | ICD-10-CM | POA: Diagnosis not present

## 2024-08-18 ENCOUNTER — Other Ambulatory Visit: Payer: Self-pay | Admitting: Orthopedic Surgery

## 2024-08-18 DIAGNOSIS — M545 Low back pain, unspecified: Secondary | ICD-10-CM

## 2024-08-19 ENCOUNTER — Ambulatory Visit (HOSPITAL_COMMUNITY)
Admission: RE | Admit: 2024-08-19 | Discharge: 2024-08-19 | Disposition: A | Discharge status: Home / Self Care | Source: Ambulatory Visit | Attending: Physician Assistant | Admitting: Physician Assistant

## 2024-08-19 ENCOUNTER — Ambulatory Visit
Admission: RE | Admit: 2024-08-19 | Discharge: 2024-08-19 | Disposition: A | Discharge status: Home / Self Care | Source: Ambulatory Visit | Attending: Orthopedic Surgery | Admitting: Orthopedic Surgery

## 2024-08-19 DIAGNOSIS — M48061 Spinal stenosis, lumbar region without neurogenic claudication: Secondary | ICD-10-CM | POA: Diagnosis not present

## 2024-08-19 DIAGNOSIS — I2510 Atherosclerotic heart disease of native coronary artery without angina pectoris: Secondary | ICD-10-CM | POA: Diagnosis not present

## 2024-08-19 DIAGNOSIS — I77810 Thoracic aortic ectasia: Secondary | ICD-10-CM

## 2024-08-19 DIAGNOSIS — I10 Essential (primary) hypertension: Secondary | ICD-10-CM | POA: Diagnosis not present

## 2024-08-19 DIAGNOSIS — M545 Low back pain, unspecified: Secondary | ICD-10-CM

## 2024-08-19 DIAGNOSIS — Q2381 Bicuspid aortic valve: Secondary | ICD-10-CM

## 2024-08-19 DIAGNOSIS — I350 Nonrheumatic aortic (valve) stenosis: Secondary | ICD-10-CM

## 2024-08-19 DIAGNOSIS — M5126 Other intervertebral disc displacement, lumbar region: Secondary | ICD-10-CM | POA: Diagnosis not present

## 2024-08-19 DIAGNOSIS — R0602 Shortness of breath: Secondary | ICD-10-CM | POA: Diagnosis not present

## 2024-08-23 LAB — ECHOCARDIOGRAM COMPLETE
AR max vel: 4.43 cm2
AV Area VTI: 4.4 cm2
AV Area mean vel: 4.14 cm2
AV Mean grad: 4 mmHg
AV Peak grad: 7 mmHg
Ao pk vel: 1.32 m/s
Area-P 1/2: 3.27 cm2
S' Lateral: 2.83 cm

## 2024-08-30 DIAGNOSIS — M533 Sacrococcygeal disorders, not elsewhere classified: Secondary | ICD-10-CM | POA: Diagnosis not present

## 2024-09-26 DIAGNOSIS — D509 Iron deficiency anemia, unspecified: Secondary | ICD-10-CM | POA: Diagnosis not present

## 2024-09-27 NOTE — Telephone Encounter (Signed)
 Please check with [facility] to see what we need to submit so that his labs are covered. Glendia Ferrier, PA-C    09/27/2024 5:20 PM

## 2024-10-03 NOTE — Telephone Encounter (Signed)
 See message below

## 2024-10-03 NOTE — Telephone Encounter (Signed)
 Spoke with [facility] and was told the bill is still pending. They want pt to disregard the letter they received and as of today 10/03/24, that bill is pending. I tried to ask about codes and they wouldn't add any new codes for billing while it's pending. Spoke with pts spouse, she is aware of the above information and will contact our office back if needed.

## 2024-10-11 ENCOUNTER — Ambulatory Visit: Admitting: Gastroenterology

## 2024-10-17 ENCOUNTER — Ambulatory Visit: Attending: Cardiovascular Disease | Admitting: Cardiovascular Disease

## 2024-10-17 ENCOUNTER — Encounter: Payer: Self-pay | Admitting: Cardiovascular Disease

## 2024-10-17 ENCOUNTER — Other Ambulatory Visit: Payer: Self-pay | Admitting: Physician Assistant

## 2024-10-17 VITALS — BP 142/60 | HR 55 | Ht 65.0 in | Wt 183.8 lb

## 2024-10-17 DIAGNOSIS — I10 Essential (primary) hypertension: Secondary | ICD-10-CM | POA: Diagnosis not present

## 2024-10-17 DIAGNOSIS — I350 Nonrheumatic aortic (valve) stenosis: Secondary | ICD-10-CM | POA: Diagnosis not present

## 2024-10-17 DIAGNOSIS — I2510 Atherosclerotic heart disease of native coronary artery without angina pectoris: Secondary | ICD-10-CM | POA: Diagnosis not present

## 2024-10-17 DIAGNOSIS — I77810 Thoracic aortic ectasia: Secondary | ICD-10-CM

## 2024-10-17 NOTE — Patient Instructions (Signed)

## 2024-10-17 NOTE — Assessment & Plan Note (Addendum)
 White coat component. Reports home BP readings on average 130/70 mmHg.  Continue current management with lisinopril  and metoprolol 

## 2024-10-17 NOTE — Assessment & Plan Note (Addendum)
 Recent PET-stress reviewed and shows no ischemia.  Continue aspirin  for antiplatelet therapy as well as a high intensity statin drug.

## 2024-10-17 NOTE — Progress Notes (Signed)
 " Cardiology Office Note:    Date:  10/17/2024   ID:  James Wilkins, DOB [birth year], MRN [id number]  PCP:  Loreli Elsie JONETTA Mickey., MD   [facility] Providers Cardiologist:  Ozell Fell, MD Cardiology APP:  Lelon Glendia ONEIDA DEVONNA     Referring MD: Loreli Elsie JONETTA Mickey., MD   Chief Complaint  Patient presents with   Shortness of Breath    History of Present Illness:    James Wilkins is a 64 y.o. male with a hx of:  Coronary artery disease, BAV with aortic stenosis S/p CABG + bioprosthetic AVR 08/2017 TTE 07/26/21: EF 60-65, no RWMA, mild LVH, G1 DD, normal RVSF, trivial MR, AVR without AI or AS, ascending aorta 43 mm Mildly dilated ascending aorta CTA 11/11/22: Asc thoracic aorta ectasia 39 mm Chest CTA 11/27/23: ascending thoracic aorta 4.1 cm  Hypertension Hyperlipidemia Chronic kidney disease  LBBB GERD Carotid US  08/21/17: bilat 1-39 ABIs 07/19/21: normal   The patient presented with exertional dyspnea in October 2025 and underwent NM PET Stress MPI showing normal perfusion and no ischemia. Echo showed LVEF 55-60% and normal aortic bioprosthetic function with a mean gradient of 4 mmHg. He is here alone today. He continues to have some shortness of breath with outdoor physical labor, but has no problems with walking one mile. No chest pain or chest pressure.    Current Medications: Active Medications[1]   Allergies:   Gluten meal and Wheat   ROS:   Please see the history of present illness.    Back pain, hemorrhoidal bleeding. All other systems reviewed and are negative.  EKGs/Labs/Other Studies Reviewed:    The following studies were reviewed today: Cardiac Studies & Procedures   ______________________________________________________________________________________________ CARDIAC CATHETERIZATION  CARDIAC CATHETERIZATION 08/12/2017  Conclusion 1.  Severe LAD stenosis at the level of the first septal perforator 2.  Severe ostial left circumflex stenosis 3.   Mild nonobstructive stenosis of the right coronary artery 4.  Normal right heart hemodynamics 5.  Known severe aortic stenosis  The patient will continue evaluation for CABG/AVR.  Anticipate grafting of the LAD and large first OM branches.  Findings Coronary Findings Diagnostic  Dominance: Right  Left Main Mid LM to Dist LM lesion is 30% stenosed. The distal left mainstem has mild disease without high-grade obstruction  Left Anterior Descending Prox LAD lesion is 85% stenosed. The LAD at the first septal perforator has severe 85% stenosis just beyond a large first diagonal branch.  Left Circumflex Ost Cx to Prox Cx lesion is 90% stenosed. There is severe stenosis at the ostium of the left circumflex.  There is a large intermediate/high OM branch beyond the area of severe stenosis Prox Cx lesion is 80% stenosed.  First Obtuse Marginal Branch Vessel is large in size.  Right Coronary Artery There is mild diffuse disease throughout the vessel. The right coronary artery is dominant.  The vessel has mild diffuse calcific disease.  There are no high-grade stenoses present.  The PDA is a large vessel with mild diffuse nonobstructive disease as well. Prox RCA to Mid RCA lesion is 40% stenosed.  Intervention  No interventions have been documented.   STRESS TESTS  NM PET CT CARDIAC PERFUSION MULTI W/ABSOLUTE BLOODFLOW 07/28/2024  Narrative   Normal perfusion images.  Myocardial blood flow reserve is reduced but can be inaccurate in setting of prior CABG.  No high risk findings such as TID or drop in EF with stress.  Overall, study is low  risk   LV perfusion is normal. There is no evidence of ischemia. There is no evidence of infarction.   Rest left ventricular function is abnormal. Rest global function is mildly reduced. Rest EF: 43%. Stress left ventricular function is normal. Stress EF: 51%. End diastolic cavity size is normal. End systolic cavity size is normal.   Myocardial blood  flow reserve is not reported in this patient due to technical or patient-specific concerns that affect accuracy.   Coronary calcium  assessment not performed due to prior revascularization.   The study is normal. The study is low risk.   Electronically signed by Lonni Nanas, MD  CLINICAL DATA:  This over-read does not include interpretation of cardiac or coronary anatomy or pathology. The cardiac SPECT CT interpretation by the cardiologist is attached.  COMPARISON:  11/27/2023.  FINDINGS: Vascular: Previous median sternotomy and CABG procedure and aortic valve repair. Cardiac enlargement. No pericardial effusion. Ascending thoracic aorta measures 4.1 cm, image 21/3.  Mediastinum/Nodes: No mediastinal mass or adenopathy.  Lungs/Pleura: No pleural fluid or pneumothorax. No airspace consolidation. No suspicious pulmonary nodule or mass.  Upper Abdomen: No acute findings within the imaged portions of the upper abdomen.  Musculoskeletal: Endplate degenerative changes in the thoracic spine. No acute or suspicious osseous findings.  IMPRESSION: 1. No acute abnormality. 2. Ascending thoracic aorta measures 4.1 cm. Unchanged from previous exam. Recommend annual imaging followup by CTA or MRA. This recommendation follows 2010 ACCF/AHA/AATS/ACR/ASA/SCA/SCAI/SIR/STS/SVM Guidelines for the Diagnosis and Management of Patients with Thoracic Aortic Disease. Circulation. 2010; 121: Z733-z630.   Electronically Signed By: Waddell Calk M.D. On: 07/28/2024 16:24   ECHOCARDIOGRAM  ECHOCARDIOGRAM COMPLETE 08/19/2024  Narrative ECHOCARDIOGRAM REPORT    Patient Name:   James Wilkins    Date of Exam: 08/19/2024 Medical Rec #:  [id number]       Height:       65.0 in Accession #:    [id number]      Weight:       180.0 lb Date of Birth:  05/28/1961       BSA:          1.892 m Patient Age:    63 years        BP:           132/65 mmHg Patient Gender: M               HR:           53  bpm. Exam Location:  [address]  Procedure: 2D Echo, Cardiac Doppler, Color Doppler, 3D Echo and Strain Analysis (Both Spectral and Color Flow Doppler were utilized during procedure).  Indications:    Coronary artery disease involving native coronary artery of native heart without angina pectoris [I25.10 (ICD-10-CM)]; Ascending aorta dilation [I77.810 (ICD-10-CM)]; Bicuspid aortic valve [Q23.81 (ICD-10-CM)]; Nonrheumatic aortic valve stenosis [I35.0 (ICD-10-CM)]; Essential hypertension [I10 (ICD-10-CM)]; Shortness of breath [R06.02 (ICD-10-CM)]  History:        Patient has prior history of Echocardiogram examinations. CAD; Risk Factors:Hypertension, Dyslipidemia and Non-Smoker. Aortic Valve: 25 mm Edwards Intuity valve is present in the aortic position. Procedure Date: 08/25/2017.  Sonographer:    Rosaline Fujisawa MHA, RDMS, RVT, RDCS Referring Phys: [id number] GLENDIA DASEN WEAVER  IMPRESSIONS   1. Left ventricular ejection fraction, by estimation, is 55 to 60%. The left ventricle has normal function. Left ventricular endocardial border not optimally defined to evaluate regional wall motion. There is mild left ventricular hypertrophy. Left ventricular diastolic parameters are indeterminate. 2. Right  ventricular systolic function is mildly reduced. The right ventricular size is normal. Tricuspid regurgitation signal is inadequate for assessing PA pressure. 3. The mitral valve is grossly normal. Mild mitral valve regurgitation. No evidence of mitral stenosis. 4. The aortic valve has been repaired/replaced. Aortic valve regurgitation is not visualized. There is a 25 mm Edwards Intuity valve present in the aortic position. Procedure Date: 08/25/2017. Echo findings are consistent with normal structure and function of the aortic valve prosthesis. Aortic valve area, by VTI measures 4.40 cm. Aortic valve mean gradient measures 4.0 mmHg. Aortic valve Vmax measures 1.32 m/s. 5. The inferior vena  cava is normal in size with greater than 50% respiratory variability, suggesting right atrial pressure of 3 mmHg.  FINDINGS Left Ventricle: Left ventricular ejection fraction, by estimation, is 55 to 60%. The left ventricle has normal function. Left ventricular endocardial border not optimally defined to evaluate regional wall motion. Global longitudinal strain performed but not reported based on interpreter judgement due to suboptimal tracking. 3D ejection fraction reviewed and evaluated as part of the interpretation. Alternate measurement of EF is felt to be most reflective of LV function. The left ventricular internal cavity size was normal in size. There is mild left ventricular hypertrophy. Abnormal (paradoxical) septal motion consistent with post-operative status. Left ventricular diastolic parameters are indeterminate.  Right Ventricle: The right ventricular size is normal. No increase in right ventricular wall thickness. Right ventricular systolic function is mildly reduced. Tricuspid regurgitation signal is inadequate for assessing PA pressure.  Left Atrium: Left atrial size was normal in size.  Right Atrium: Right atrial size was normal in size.  Pericardium: There is no evidence of pericardial effusion.  Mitral Valve: The mitral valve is grossly normal. Mild mitral valve regurgitation. No evidence of mitral valve stenosis.  Tricuspid Valve: The tricuspid valve is normal in structure. Tricuspid valve regurgitation is trivial. No evidence of tricuspid stenosis.  Aortic Valve: The aortic valve has been repaired/replaced. Aortic valve regurgitation is not visualized. Aortic valve mean gradient measures 4.0 mmHg. Aortic valve peak gradient measures 7.0 mmHg. Aortic valve area, by VTI measures 4.40 cm. There is a 25 mm Edwards Intuity valve present in the aortic position. Procedure Date: 08/25/2017. Echo findings are consistent with normal structure and function of the aortic valve  prosthesis.  Pulmonic Valve: The pulmonic valve was normal in structure. Pulmonic valve regurgitation is not visualized. No evidence of pulmonic stenosis.  Aorta: The ascending aorta was not well visualized.  Venous: The inferior vena cava is normal in size with greater than 50% respiratory variability, suggesting right atrial pressure of 3 mmHg.  IAS/Shunts: No atrial level shunt detected by color flow Doppler.  Additional Comments: 3D was performed not requiring image post processing on an independent workstation and was indeterminate.   LEFT VENTRICLE PLAX 2D LVIDd:         4.42 cm   Diastology LVIDs:         2.83 cm   LV e' medial:    4.03 cm/s LV PW:         1.16 cm   LV E/e' medial:  26.8 LV IVS:        0.99 cm   LV e' lateral:   6.42 cm/s LVOT diam:     2.50 cm   LV E/e' lateral: 16.8 LV SV:         131 LV SV Index:   69 LVOT Area:     4.91 cm LV IVRT:  93 msec 3D Volume EF: 3D EF:        51 % LV EDV:       232 ml LV ESV:       113 ml LV SV:        119 ml  RIGHT VENTRICLE            IVC RV Basal diam:  3.25 cm    IVC diam: 1.09 cm RV Mid diam:    2.41 cm RV S prime:     8.70 cm/s TAPSE (M-mode): 1.3 cm  LEFT ATRIUM             Index        RIGHT ATRIUM           Index LA diam:        3.22 cm 1.70 cm/m   RA Area:     13.30 cm LA Vol (A2C):   42.2 ml 22.31 ml/m  RA Volume:   25.10 ml  13.27 ml/m LA Vol (A4C):   57.6 ml 30.45 ml/m LA Biplane Vol: 50.2 ml 26.54 ml/m AORTIC VALVE AV Area (Vmax):    4.43 cm AV Area (Vmean):   4.14 cm AV Area (VTI):     4.40 cm AV Vmax:           132.00 cm/s AV Vmean:          88.200 cm/s AV VTI:            0.297 m AV Peak Grad:      7.0 mmHg AV Mean Grad:      4.0 mmHg LVOT Vmax:         119.00 cm/s LVOT Vmean:        74.400 cm/s LVOT VTI:          0.266 m LVOT/AV VTI ratio: 0.90  AORTA Ao Root diam: 2.57 cm Ao Asc diam:  3.49 cm  MITRAL VALVE MV Area (PHT): 3.27 cm     SHUNTS MV Decel Time: 232 msec      Systemic VTI:  0.27 m MV E velocity: 108.00 cm/s  Systemic Diam: 2.50 cm MV A velocity: 94.30 cm/s MV E/A ratio:  1.15  Soyla Merck MD Electronically signed by Soyla Merck MD Signature Date/Time: 08/23/2024/9:38:10 AM    Final   TEE  ECHO TEE 08/25/2017  Interpretation Summary  Aortic valve: Severe stenosis.  Right ventricle: Right ventricle was normal in size. There was normal right ventricular systolic function. In the post-bypass study, there was normal right ventricular systolic function and normal right ventricular size.  Tricuspid valve: Tricuspid valve appeared structurally normal. There was trace tricuspid insufficiency with a central jet.  Pulmonic valve: Pulmonic valve appeared structurally normal and there was trace pulmonic insufficiency.  Mitral valve: There was mild mitral annular calcification involving the bases of both the anterior and posterior leaflets. The mitral leaflets were mildly thickened. There was normal leaflet mobility and leaflet separation. There was normal coaptation of the leaflets. There were no flail or prolapsing leaflet segments. There was trace mitral insufficiency.        ______________________________________________________________________________________________      EKG:        Recent Labs: 07/19/2024: ALT 25; BUN 16; Creatinine, Ser 1.56; NT-Pro BNP 91; Potassium 4.5; Sodium 142 07/21/2024: Hemoglobin 9.1; Platelets 289  Recent Lipid Panel    Component Value Date/Time   CHOL 125 04/25/2022 0718   TRIG 58 04/25/2022 0718   HDL 55 04/25/2022 0718  CHOLHDL 2.3 04/25/2022 0718   LDLCALC 57 04/25/2022 0718           Physical Exam:    VS:  BP (!) 142/60 (BP Location: Right Arm, Patient Position: Sitting, Cuff Size: Large)   Pulse (!) 55   Ht 5' 5 (1.651 m)   Wt 183 lb 12.8 oz (83.4 kg)   SpO2 99%   BMI 30.59 kg/m     Wt Readings from Last 3 Encounters:  10/17/24 183 lb 12.8 oz (83.4 kg)  08/12/24 180  lb (81.6 kg)  07/19/24 183 lb (83 kg)     GEN:  Well nourished, well developed in no acute distress HEENT: Normal NECK: No JVD; No carotid bruits LYMPHATICS: No lymphadenopathy CARDIAC: RRR, no murmurs, rubs, gallops RESPIRATORY:  Clear to auscultation without rales, wheezing or rhonchi  ABDOMEN: Soft, non-tender, non-distended MUSCULOSKELETAL:  No edema; No deformity  SKIN: Warm and dry NEUROLOGIC:  Alert and oriented x 3 PSYCHIATRIC:  Normal affect   Assessment & Plan Coronary artery disease involving native coronary artery of native heart without angina pectoris Recent PET-stress reviewed and shows no ischemia.  Continue aspirin  for antiplatelet therapy as well as a high intensity statin drug. Essential hypertension White coat component. Reports home BP readings on average 130/70 mmHg.  Continue current management with lisinopril  and metoprolol  Nonrheumatic aortic valve stenosis Normal function of his aortic prosthesis on recent echo. He is compliant with antibiotics before dental work for SBE prophylaxis.  Encouraged him to go in for regular dental cleanings with his prosthetic heart valve. Ascending aorta dilation CT angiogram of the chest in February 2025 shows a stable 4.1 cm ascending thoracic aorta.      Medication Adjustments/Labs and Tests Ordered: Current medicines are reviewed at length with the patient today.  Concerns regarding medicines are outlined above.  No orders of the defined types were placed in this encounter.  No orders of the defined types were placed in this encounter.   Patient Instructions  Medication Instructions:  No medication changes were made at this visit. Continue current regimen.   *If you need a refill on your cardiac medications before your next appointment, please call your pharmacy*  Lab Work: None ordered today. If you have labs (blood work) drawn today and your tests are completely normal, you will receive your results only  by: MyChart Message (if you have MyChart) OR A paper copy in the mail If you have any lab test that is abnormal or we need to change your treatment, we will call you to review the results.  Testing/Procedures: None ordered today.  Follow-Up: At [facility], you and your health needs are our priority.  As part of our continuing mission to provide you with exceptional heart care, our providers are all part of one team.  This team includes your primary Cardiologist (physician) and Advanced Practice Providers or APPs (Physician Assistants and Nurse Practitioners) who all work together to provide you with the care you need, when you need it.  Your next appointment:   1 year(s)  Provider:   Ozell Fell, MD     Signed, Ozell Fell, MD  10/17/2024 9:23 AM    [facility]     [1]  Current Meds  Medication Sig   acetaminophen  (TYLENOL ) 500 MG tablet Take 1,000 mg by mouth every 6 (six) hours as needed for moderate pain (pain score 4-6).   aspirin  EC 81 MG tablet Take 81 mg by mouth daily. Swallow whole.  ezetimibe  (ZETIA ) 10 MG tablet Take 1 tablet (10 mg total) by mouth daily.   ferrous sulfate  325 (65 FE) MG tablet Take 1 tablet (325 mg total) by mouth daily with breakfast.   gabapentin  (NEURONTIN ) 300 MG capsule Take 300 mg by mouth 3 (three) times daily.   hydrocortisone (PROCTOZONE-HC) 2.5 % rectal cream Place 1 Application rectally daily as needed for hemorrhoids.   levothyroxine  (SYNTHROID ) 75 MCG tablet Take 75 mcg by mouth daily before breakfast.   lisinopril  (ZESTRIL ) 20 MG tablet TAKE 1 TABLET(20 MG) BY MOUTH DAILY   metoprolol  tartrate (LOPRESSOR ) 25 MG tablet Take 0.5 tablets (12.5 mg total) by mouth 2 (two) times daily.   Multiple Vitamins-Minerals (CENTRUM SILVER ULTRA MENS PO) Take 1 tablet by mouth daily.   nitroGLYCERIN  (NITROSTAT ) 0.4 MG SL tablet Place 1 tablet (0.4 mg total) under the tongue every 5 (five) minutes as needed for chest pain.    rosuvastatin  (CRESTOR ) 40 MG tablet TAKE 1 TABLET(40 MG) BY MOUTH DAILY   "

## 2024-12-02 ENCOUNTER — Other Ambulatory Visit (HOSPITAL_COMMUNITY)
